# Patient Record
Sex: Male | Born: 1960 | Race: Asian | Hispanic: No | Marital: Married | Smoking: Former smoker
Health system: Southern US, Community
[De-identification: ages and names within clinical notes are randomized; demographics above are authoritative.]

## PROBLEM LIST (undated history)

## (undated) DIAGNOSIS — F1010 Alcohol abuse, uncomplicated: Secondary | ICD-10-CM

## (undated) DIAGNOSIS — I10 Essential (primary) hypertension: Secondary | ICD-10-CM

## (undated) DIAGNOSIS — I639 Cerebral infarction, unspecified: Secondary | ICD-10-CM

## (undated) DIAGNOSIS — E785 Hyperlipidemia, unspecified: Secondary | ICD-10-CM

## (undated) DIAGNOSIS — N179 Acute kidney failure, unspecified: Secondary | ICD-10-CM

## (undated) HISTORY — DX: Cerebral infarction, unspecified: I63.9

## (undated) HISTORY — DX: Essential (primary) hypertension: I10

---

## 2010-05-15 ENCOUNTER — Ambulatory Visit (HOSPITAL_BASED_OUTPATIENT_CLINIC_OR_DEPARTMENT_OTHER)
Admission: RE | Admit: 2010-05-15 | Discharge: 2010-05-15 | Payer: Self-pay | Source: Home / Self Care | Admitting: Internal Medicine

## 2010-05-15 ENCOUNTER — Encounter: Payer: Self-pay | Admitting: Internal Medicine

## 2010-05-15 ENCOUNTER — Ambulatory Visit: Payer: Self-pay | Admitting: Interventional Radiology

## 2010-05-15 ENCOUNTER — Ambulatory Visit: Payer: Self-pay | Admitting: Internal Medicine

## 2010-05-15 DIAGNOSIS — R143 Flatulence: Secondary | ICD-10-CM

## 2010-05-15 DIAGNOSIS — R0602 Shortness of breath: Secondary | ICD-10-CM

## 2010-05-15 DIAGNOSIS — R142 Eructation: Secondary | ICD-10-CM

## 2010-05-15 DIAGNOSIS — R141 Gas pain: Secondary | ICD-10-CM

## 2010-05-15 LAB — CONVERTED CEMR LAB
Albumin: 4.6 g/dL (ref 3.5–5.2)
BUN: 12 mg/dL (ref 6–23)
Basophils Relative: 1 % (ref 0–1)
CO2: 25 meq/L (ref 19–32)
Chloride: 104 meq/L (ref 96–112)
Creatinine, Ser: 1.05 mg/dL (ref 0.40–1.50)
Glucose, Bld: 75 mg/dL (ref 70–99)
Hemoglobin: 14.9 g/dL (ref 13.0–17.0)
Indirect Bilirubin: 0.2 mg/dL (ref 0.0–0.9)
LDL Cholesterol: 135 mg/dL — ABNORMAL HIGH (ref 0–99)
Lymphocytes Relative: 39 % (ref 12–46)
Lymphs Abs: 3.1 10*3/uL (ref 0.7–4.0)
Monocytes Absolute: 0.8 10*3/uL (ref 0.1–1.0)
Monocytes Relative: 10 % (ref 3–12)
Neutro Abs: 3.8 10*3/uL (ref 1.7–7.7)
Neutrophils Relative %: 48 % (ref 43–77)
Potassium: 4.6 meq/L (ref 3.5–5.3)
RBC: 5.64 M/uL (ref 4.22–5.81)
Total Bilirubin: 0.3 mg/dL (ref 0.3–1.2)
Total Protein: 7.3 g/dL (ref 6.0–8.3)
Triglycerides: 101 mg/dL (ref <150)
VLDL: 20 mg/dL (ref 0–40)
WBC: 7.8 10*3/uL (ref 4.0–10.5)

## 2010-05-16 ENCOUNTER — Encounter: Payer: Self-pay | Admitting: Internal Medicine

## 2010-05-29 ENCOUNTER — Ambulatory Visit: Payer: Self-pay | Admitting: Pulmonary Disease

## 2010-05-29 DIAGNOSIS — F17200 Nicotine dependence, unspecified, uncomplicated: Secondary | ICD-10-CM

## 2010-07-13 ENCOUNTER — Ambulatory Visit: Payer: Self-pay | Admitting: Internal Medicine

## 2010-09-04 NOTE — Letter (Signed)
   [facility] [address] [place], [place]  [address]  [place] Phone: [phone]      May 16, 2010   Willie Herrera [address] [place], [place] [address]  RE:  LAB RESULTS  Dear  Willie Herrera,  The following is an interpretation of your most recent lab tests.  Please take note of any instructions provided or changes to medications that have resulted from your lab work.  ELECTROLYTES:  Good - no changes needed  KIDNEY FUNCTION TESTS:  Good - no changes needed  LIVER FUNCTION TESTS:  Good - no changes needed  LIPID PANEL:  Fair - review at your next visit Triglyceride: 101   Cholesterol: 213   LDL: 135   HDL: 58   Chol/HDL%:  3.7 Ratio  THYROID STUDIES:  Thyroid studies normal TSH: 1.567     CBC:  Good - no changes needed       Sincerely Yours,    Dr. Thomos Lemons  Appended Document:  mailed

## 2010-09-04 NOTE — Assessment & Plan Note (Signed)
Summary: NEW TO EST BCBS/MHF   Vital Signs:  Patient profile:   50 year old male Height:      63.5 inches Weight:      173.75 pounds BMI:     30.41 O2 Sat:      97 % on Room air Temp:     98.3 degrees F oral Pulse rate:   75 / minute Pulse rhythm:   regular Resp:     18 per minute BP sitting:   104 / 80  (right arm) Cuff size:   large  Vitals Entered By: Glendell Docker CMA (May 15, 2010 9:48 AM)  O2 Flow:  Room air  Contraindications/Deferment of Procedures/Staging:    Test/Procedure: FLU VAX    Reason for deferment: patient declined  CC: New Patient  Is Patient Diabetic? No Pain Assessment Patient in pain? no      Comments Establish care, physical assessment   Primary Care Willie Herrera:  Willie Spry DO  CC:  New Patient .  History of Present Illness: 50 yo asian male to routine cpx.  No prev PCP  no prev med hx no chronic meds  pt with limited English he is originally from [place]  he notes chest feels tight occ when he wakes up in the AM  he is also concerned about distended abd  denies hx of liver disease  Preventive Screening-Counseling & Management  Alcohol-Tobacco     Alcohol drinks/day: <1     Smoking Status: current     Packs/Day: 0.5     Year Started: 1991  Caffeine-Diet-Exercise     Caffeine use/day: None     Does Patient Exercise: no  Allergies (verified): No Known Drug Allergies  Past History:  Past Medical History: Unremarkable  Family History: denies family hx of CAD, DM II, cancer  Social History: Married- 25 years 2 sons age 73, 42 Current Smoker - 1/2 ppd 20 yrs Alcohol use-yes (2-4 drinks per week) originally from - [place]  Been is [place] for 10 yrs Occupation:  works at [employer] Smoking Status:  current Packs/Day:  0.5 Caffeine use/day:  None Does Patient Exercise:  no  Review of Systems       The patient complains of chest pain.  The patient denies weight loss, weight gain, dyspnea on  exertion, prolonged cough, abdominal pain, melena, and hematochezia.         no notes skin is sensitive.  gets rash after scratching  ROS - limited by language barrier  Physical Exam  General:  alert, well-developed, and well-nourished.   Head:  normocephalic and atraumatic.   Ears:  R ear normal and L ear normal.  hearing is grossly normal  Mouth:  pharynx pink and moist and poor dentition.   Neck:  supple, no masses, and no thyromegaly.   Lungs:  normal respiratory effort, normal breath sounds, no crackles, and no wheezes.   Heart:  normal rate, regular rhythm, and no gallop.  heart sounds somewhat distant Abdomen:  distended,  soft, no masses, no hepatomegaly, and no splenomegaly.   Pulses:  dorsalis pedis and posterior tibial pulses are full and equal bilaterally Extremities:  No lower extremity edema Neurologic:  cranial nerves II-XII intact and gait normal.   Skin:  scattered hives left side of abd   Impression & Recommendations:  Problem # 1:  HEALTH MAINTENANCE EXAM (ICD-V70.0) Reviewed adult health maintenance protocols.  Orders: T-Lipid Profile [id number])  Flu Vax: Declined (05/15/2010)  Problem # 2:  SHORTNESS OF BREATH (ICD-786.05) office spirometry may be unreliable.  pt had difficulty understanding instructions as per nursing staff I encouraged smoking cessation  Orders: Spirometry w/Graph (21308) Pulmonary Referral (Pulmonary)  Problem # 3:  ABDOMINAL DISTENSION (ICD-787.3)  Orders: T-Basic Metabolic Panel [id number]) T-Hepatic Function [id number]) T-CBC w/Diff [id number]) T-TSH [id number])  Other Orders: T-2 View CXR, Same Day ([id number]) EKG w/ Interpretation (93000)  Patient Instructions: 1)  Please schedule a follow-up appointment in 2 months.  Current Allergies (reviewed today): No known allergies    Immunization History:  Influenza Immunization History:    Influenza:  declined (05/15/2010)

## 2010-09-04 NOTE — Assessment & Plan Note (Signed)
Summary: sob/ mbw   Visit Type:  Initial Consult Copy to:  pcp Primary Roye Gustafson/Referring Iokepa Geffre:  Dondra Spry DO  CC:  Pt here for pulmonary consult. Pt request flu vaccine today.  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 he reports sharp sub sternal chest pain, non radiating, non exertional , on & off x 1 yr, gradual worsening . He reports mild dyspnea when he hurries around - no wheezing, cough, pedal edema, orthopnea or nocturnal symptoms or prior pneumonia. he denies meal related pains or heartburn. CXR showed coarse perihilar & interstitial markings. EKG nml, labs wnl Spirometry was a poor effort. Repeat spirometry today showed no airway obstruction, only 1 good effort, FEv1 81%, FVC 76% - mild restriction  Preventive Screening-Counseling & Management  Alcohol-Tobacco     Alcohol drinks/day: <1     Alcohol type: spirits     Smoking Status: current     Packs/Day: 0.5     Year Started: 1991  Current Medications (verified): 1)  None  Allergies (verified): No Known Drug Allergies  Past History:  Past Medical History: Last updated: 05/15/2010 Unremarkable  Family History: Last updated: 05/15/2010 denies family hx of CAD, DM II, cancer  Social History: Last updated: 05/15/2010 Married- 25 years 2 sons age 4, 52 Current Smoker - 1/2 ppd 20 yrs Alcohol use-yes (2-4 drinks per week) originally from - [place]  Been is [place] for 10 yrs 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  The patient denies shortness of breath at rest, productive cough, non-productive cough, coughing up blood, irregular heartbeats, acid heartburn, indigestion, loss of appetite, weight change, abdominal  pain, difficulty swallowing, sore throat, tooth/dental problems, headaches, nasal congestion/difficulty breathing through nose, sneezing, ear ache, anxiety, depression, hand/feet swelling, joint stiffness or pain, change in color of mucus, and fever.    Vital Signs:  Patient profile:   50 year old male Height:      63.5 inches Weight:      171.5 pounds BMI:     30.01 O2 Sat:      98 % on Room air Temp:     98.5 degrees F oral Pulse rate:   67 / minute BP sitting:   108 / 74  (left arm) Cuff size:   regular  Vitals Entered By: Zackery Barefoot CMA (May 29, 2010 9:27 AM)  O2 Flow:  Room air CC: Pt here for pulmonary consult. Pt request flu vaccine today Comments Medications reviewed with patient Verified contact number and pharmacy with patient Zackery Barefoot CMA  May 29, 2010 9:27 AM    Physical Exam  Additional Exam:  Gen. Pleasant, well-nourished, in no distress, normal affect ENT - no lesions, no post nasal drip Neck: No JVD, no thyromegaly, no carotid bruits Lungs: no use of accessory muscles, no dullness to percussion, clear without rales or rhonchi  Cardiovascular: Rhythm regular, heart sounds  normal, no murmurs or gallops, no peripheral edema Abdomen: soft and non-tender, no hepatosplenomegaly, BS normal. Musculoskeletal: No deformities, no cyanosis or clubbing Neuro:  alert, non focal     Impression & Recommendations:  Problem # 1:  SHORTNESS OF BREATH (ICD-786.05) No clear reason for coarse interstitial markings on CXR - no obvious ILD Rpt  CXR in 6 mnths mild restriction on spirometry  - poor effort Orders: Est. Patient Level III (16109) Prescription Created Electronically ([id number])Future Orders: T-2 View CXR ([id number]) ... 11/13/2010  Problem # 2:  TOBACCO ABUSE (ICD-305.1)  he will try to set a quit date. His updated medication list for this problem includes:    Chantix Starting Month Pak 0.5 Mg X 11 & 1 Mg X 42 Tabs (Varenicline tartrate) .Marland Kitchen...  Take as directed    Chantix Continuing Month Pak 1 Mg Tabs (Varenicline tartrate) .Marland Kitchen... Take as directed  Orders: Est. Patient Level III (60454) Prescription Created Electronically [id number])  Medications Added to Medication List This Visit: 1)  Chantix Starting Month Pak 0.5 Mg X 11 & 1 Mg X 42 Tabs (Varenicline tartrate) .... Take as directed 2)  Chantix Continuing Month Pak 1 Mg Tabs (Varenicline tartrate) .... Take as directed  Patient Instructions: 1)  Copy sent to:dr Artist Pais 2)  Please schedule a follow-up appointment in 6 months with chest x ray 3)  Take pepcid over the counter for heartburn x 4 wks 4)  Chantix Rx given to try & quit smoking- set quit date first Prescriptions: CHANTIX CONTINUING MONTH PAK 1 MG TABS (VARENICLINE TARTRATE) take as directed  #1 x 2   Entered and Authorized by:   Comer Locket Vassie Loll MD   Signed by:   Comer Locket Vassie Loll MD on 05/29/2010   Method used:   Print then Give to Patient   RxID:   [id number] CHANTIX STARTING MONTH PAK 0.5 MG X 11 & 1 MG X 42 TABS (VARENICLINE TARTRATE) take as directed  #1 x 0   Entered and Authorized by:   Comer Locket Vassie Loll MD   Signed by:   Comer Locket Vassie Loll MD on 05/29/2010   Method used:   Print then Give to Patient   RxID:   [id number]

## 2010-09-06 NOTE — Assessment & Plan Note (Signed)
Summary: 2 MONTH FU/DT   Vital Signs:  Patient profile:   50 year old male Height:      63.5 inches Weight:      171.50 pounds BMI:     30.01 O2 Sat:      100 % on Room air Temp:     97.9 degrees F oral Pulse rate:   65 / minute Resp:     18 per minute BP sitting:   120 / 90  (left arm) Cuff size:   large  Vitals Entered By: Glendell Docker CMA (July 13, 2010 3:30 PM)  O2 Flow:  Room air CC: 2 Month Follow up  Is Patient Diabetic? No Pain Assessment Patient in pain? no      Comments no concerns   Primary Care Mahdiya Mossberg:  Dondra Spry DO  CC:  2 Month Follow up .  History of Present Illness: 50 y/o Asian male for follow up pt accompanied by son who is translating  first noticed SOB 7 months ago he assoc with weather change mild sub sternal  pain - non exertional he is trying to reduce tob use  lab results reviewed   Preventive Screening-Counseling & Management  Alcohol-Tobacco     Smoking Status: current  Allergies (verified): No Known Drug Allergies  Family History: denies family hx of CAD, DM II, cancer   Social History: Married- 25 years 2 sons age 39, 3  Current Smoker - 1/2 ppd 20 yrs Alcohol use-yes (2-4 drinks per week) originally from - [place]  Been is [place] for 10 yrs Occupation:  works at [employer]  Physical Exam  General:  alert, well-developed, and well-nourished.   Lungs:  normal respiratory effort, normal breath sounds, no crackles, and no wheezes.   Heart:  normal rate, regular rhythm, and no gallop.  heart sounds somewhat distant Extremities:  No lower extremity edema   Impression & Recommendations:  Problem # 1:  SHORTNESS OF BREATH (ICD-786.05) Assessment Improved seen by pulm likely related to tob use and poor conditioning CXR to be repeated in 6 months I urged tob cessation we discussed proper use of nicotine patches and lozenges  Patient Instructions: 1)  Please schedule a follow-up appointment  in 1 year.   Orders Added: 1)  Est. Patient Level III [16109]    Current Allergies (reviewed today): No known allergies

## 2014-04-18 ENCOUNTER — Ambulatory Visit (INDEPENDENT_AMBULATORY_CARE_PROVIDER_SITE_OTHER): Payer: BC Managed Care – PPO | Admitting: Medical

## 2014-04-18 ENCOUNTER — Encounter: Payer: Self-pay | Admitting: Medical

## 2014-04-18 VITALS — BP 128/84 | HR 71 | Temp 98.6°F | Ht 63.4 in | Wt 182.4 lb

## 2014-04-18 DIAGNOSIS — E669 Obesity, unspecified: Secondary | ICD-10-CM

## 2014-04-18 DIAGNOSIS — G4700 Insomnia, unspecified: Secondary | ICD-10-CM | POA: Insufficient documentation

## 2014-04-18 MED ORDER — ZOLPIDEM TARTRATE 10 MG PO TABS: 10.0000 mg | ORAL_TABLET | Freq: Every evening | ORAL | Status: DC | PRN

## 2014-04-18 NOTE — Assessment & Plan Note (Signed)
I will give him Ambien for short-term treatment of insomnia. He has not felt well at all for one week and this will hopefully provide him some relief. However, I did advise them that if his sleeping did not improve then would need to consider possible sleep study as he does have a body habitus that would predispose him to sleep apnea.

## 2014-04-18 NOTE — Progress Notes (Signed)
   Subjective:    Patient ID: Willie Herrera, male    DOB: 1960-08-28, 53 y.o.   MRN: [id number]  HPI  Pt not seen here for 4 years.  See PMH, PSH, and FMH. Pt son translates for him. Pt speaks Burmese.  On review of his history sleep apnea was checked but I unchecked this because he denies. Pt denies that he has any problems sleeping. Minimal snore. He states can't fall sleep and then denies any depression or anxiety. He states sleeping issues just started between 7-10 days. Pt denies that he is taking any new medications. Pt states works 7 days a week 10-11 hour days. Coffee 1 cup a day. He works 6-7 am to 5 pm. In the past happened less frequent.   On further discussion he admits some history of sleeping difficulty. He would wake up middle of the night and not be able to go back to sleep. But last week can't even fall asleep.  Pt no exercising. 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      Review of Systems  Constitutional: Negative for fever, chills and fatigue.  HENT: Negative.   Respiratory: Negative for cough, chest tightness and shortness of breath.   Cardiovascular: Negative for chest pain.  Gastrointestinal: Negative for nausea, abdominal pain, diarrhea, constipation, blood in stool, abdominal distention and anal bleeding.  Genitourinary: Negative.   Musculoskeletal: Negative.   Neurological: Negative.   Hematological: Negative for adenopathy. Does not bruise/bleed easily.  Psychiatric/Behavioral: Negative for suicidal ideas, hallucinations, behavioral problems, confusion, sleep disturbance, dysphoric mood, decreased concentration and agitation. The patient is not nervous/anxious and is not hyperactive.        Insomnia.       Objective:   Physical Exam  General- no acute distress, pleasant patient. He understands English minimally. His son translates the interview. Neck-thick, stocky. No JVD Lungs-clear even and unlabored. No on lying supine his O2 sat went from 96% to 93%. But it  did not drop any further. Heart-regular rate and rhythm. Abdomen-obese protuberant abdomen. Soft, nondistended, no rebound or guarding no organomegaly. Back-no CVA tenderness         Assessment & Plan:

## 2014-04-18 NOTE — Patient Instructions (Signed)
You appear to have insomnia. I am giving some medication for the short term insomina to help you sleep. However, keep in mind that you may have some sleep apnea. So we may refer you for sleep study in the near future. Also I want you to schedule a complete physical exam in 2 wks. This would be a very good ideas to get baseline fasting labs and schedule colonosocpy as well. Schedule early morning and come in fasting.

## 2014-04-18 NOTE — Assessment & Plan Note (Signed)
Patient is an obese and 53 years old. I do want him to come in had a complete physical exam in 2 weeks. I asked him to set that up and come in fasting. We'll see how he is sleeping at that point and may refer him for a sleep study.

## 2014-05-03 ENCOUNTER — Encounter: Payer: Self-pay | Admitting: Medical

## 2014-05-03 ENCOUNTER — Ambulatory Visit (INDEPENDENT_AMBULATORY_CARE_PROVIDER_SITE_OTHER): Payer: BC Managed Care – PPO | Admitting: Medical

## 2014-05-03 VITALS — BP 145/90 | HR 94 | Temp 98.6°F | Ht 63.4 in | Wt 185.0 lb

## 2014-05-03 DIAGNOSIS — Z1211 Encounter for screening for malignant neoplasm of colon: Secondary | ICD-10-CM

## 2014-05-03 DIAGNOSIS — Z0000 Encounter for general adult medical examination without abnormal findings: Secondary | ICD-10-CM

## 2014-05-03 DIAGNOSIS — Z23 Encounter for immunization: Secondary | ICD-10-CM

## 2014-05-03 LAB — COMPREHENSIVE METABOLIC PANEL
ALK PHOS: 60 U/L (ref 39–117)
ALT: 71 U/L — AB (ref 0–53)
AST: 44 U/L — AB (ref 0–37)
Albumin: 4.1 g/dL (ref 3.5–5.2)
BILIRUBIN TOTAL: 0.6 mg/dL (ref 0.2–1.2)
BUN: 13 mg/dL (ref 6–23)
CO2: 26 meq/L (ref 19–32)
CREATININE: 1.1 mg/dL (ref 0.4–1.5)
Calcium: 9.1 mg/dL (ref 8.4–10.5)
Chloride: 102 mEq/L (ref 96–112)
GFR: 75.86 mL/min (ref >60.00)
Glucose, Bld: 106 mg/dL — ABNORMAL HIGH (ref 70–99)
Potassium: 4.6 mEq/L (ref 3.5–5.1)
SODIUM: 136 meq/L (ref 135–145)
TOTAL PROTEIN: 8.1 g/dL (ref 6.0–8.3)

## 2014-05-03 LAB — CBC WITH DIFFERENTIAL/PLATELET
BASOS ABS: 0 10*3/uL (ref 0.0–0.1)
Basophils Relative: 0.4 % (ref 0.0–3.0)
EOS ABS: 0.2 10*3/uL (ref 0.0–0.7)
Eosinophils Relative: 2.7 % (ref 0.0–5.0)
HEMATOCRIT: 44.4 % (ref 39.0–52.0)
Hemoglobin: 14.6 g/dL (ref 13.0–17.0)
LYMPHS ABS: 2.2 10*3/uL (ref 0.7–4.0)
Lymphocytes Relative: 31.2 % (ref 12.0–46.0)
MCHC: 32.8 g/dL (ref 30.0–36.0)
MCV: 78.4 fl (ref 78.0–100.0)
MONO ABS: 0.6 10*3/uL (ref 0.1–1.0)
MONOS PCT: 7.9 % (ref 3.0–12.0)
Neutro Abs: 4.1 10*3/uL (ref 1.4–7.7)
Neutrophils Relative %: 57.8 % (ref 43.0–77.0)
PLATELETS: 208 10*3/uL (ref 150.0–400.0)
RBC: 5.67 Mil/uL (ref 4.22–5.81)
RDW: 15.4 % (ref 11.5–15.5)
WBC: 7.1 10*3/uL (ref 4.0–10.5)

## 2014-05-03 LAB — LIPID PANEL
CHOL/HDL RATIO: 3
Cholesterol: 218 mg/dL — ABNORMAL HIGH (ref 0–200)
HDL: 64.4 mg/dL (ref >39.00)
LDL CALC: 137 mg/dL — AB (ref 0–99)
NONHDL: 153.6
TRIGLYCERIDES: 83 mg/dL (ref 0.0–149.0)
VLDL: 16.6 mg/dL (ref 0.0–40.0)

## 2014-05-03 LAB — TSH: TSH: 0.42 u[IU]/mL (ref 0.35–4.50)

## 2014-05-03 NOTE — Progress Notes (Signed)
   Subjective:    Patient ID: Willie Herrera, male    DOB: 03/09/1961, 53 y.o.   MRN: [id number]  HPI  Pt in for complete physical.  Pt employed as a [employer]. Pt is taking 2 month off from work. Not exercising. He states eats fruits and vegetables. Some meats. Pt states that he finally is sleeping better with ambien but only 5 hours. Previously only slept 2 hours. Pt states never had colonoscopy. Pt quit smoking 7-8 months ago. No longer smoking. No drugs.   Pt speaks burmese(Primary Language). He states understands me well but does not speak english well.  Flu vacinne given today. Our office to not have access to [facility] database immunizations today.  No acute new problems reported today.     Review of Systems  Constitutional: Negative for fever, chills and fatigue.  HENT: Negative.   Eyes: Negative.   Respiratory: Negative for cough, chest tightness, shortness of breath and wheezing.   Cardiovascular: Negative for chest pain and palpitations.  Gastrointestinal: Negative.   Genitourinary: Negative.   Musculoskeletal: Negative.   Skin: Negative.   Neurological: Negative for dizziness, tremors, syncope, facial asymmetry, weakness, light-headedness and numbness.  Hematological: Negative for adenopathy. Does not bruise/bleed easily.  Psychiatric/Behavioral: Positive for sleep disturbance. Negative for suicidal ideas, hallucinations, behavioral problems, confusion, self-injury, dysphoric mood and agitation. The patient is not hyperactive.        Sleeping better but only 5 hours each night now. He states in past was only 2 hours each night.            Objective:   Physical Exam  General Mental Status- Alert. Orientation- Oriented x3.  Build and Nutrition- Well nourished and Well Developed.  Skin General:-Normal. Color- Normal color. Moisture- Normal. Temperature-Warm.  HEENT  Ears- Normal. Auditory Canal- Bilateral-Normal. Tympanic Membrane-  Bilateral-Normal. Eye Fundi-Bilateral-Normal. Pupil- bilateral- Direct reaction to light normal. Nose & Sinuses- Normal. Nostrils-Bilateral- Normal. Mouth & Throat-Normal. But very poor teeth. Very worn down bottom teeth.  Neck Neck- No Bruits or Masses. Trachea midline.  Thyroid- Normal.  Chest and Lung Exam Percussion: Quality and Intensity-Percussion normal. Percussion of the chest reveals- No Dullness.  Palpation: Palpation of the chest reveals- Non-tender- No dullness. Auscultation: Breath Sounds- Normal.  Adventitous Sounds:-No adventitious sounds.  Cardiovascular Inspection:- No Heaves. Auscultation:-Normal sinus rhythm without murmur gallop, S1 WNL and S2 WNL.  Abdomen Inspection:-Inspection shows protuberant abdomen mild-moderatel. Inspection of the abdomen reveals- No hernias Palpation/Percussion:- Palpation and Percussion of the Abdomen reveal- Non Tender and No Palpable abdominal masses. Liver: Other Characteristics- No hepatomegaly. Spleen:Other Characteristics- No Splenomegaly. Auscultation:- Auscultation of the abdomen reveals- Bowel sounds normal and No Abdominal bruits.  Male Genitourinary Urethra:- No discharge. Penis- Circumcised. Scrotum- No masses. Testes- Bilateral-Normal.  Rectal Anorectal Exam: Performed- Normal sphincter tone. No masses noted. Prostate smooth normal size. Stool HEME Negative.  Peripheral  Vascular Lower Extremity:Inspection- Bilateral-Inspection Normal  Palpation: Femoral pulse- Bilateral- 2+. Popliteal pulse- Bilateral-2+. Dorsalis pedis pulse- Bilateral- 1/2+. Edema- Bilateral- No edema.  Neurologic Mental Status:- Normal. Cranial Nerves:-Normal Bilaterally. Motor:-Normal. Strength:5/5 normal muscle strength-All Muscles. General Assessment of Reflexes: Right Knee-2+. Left Knee- 2+. Coordination-Normal. Gait- Normal.  Meningeal Signs- None.  Musculoskeletal Global Assessment General-Joints show full range of motion without  obvious deformity and Normal muscle mass. Strength in upper and lower extremities.  Lymphatics General lymphatics Description- No generalized lymphadenopathy. Poor dentition.        Assessment & Plan:

## 2014-05-03 NOTE — Assessment & Plan Note (Signed)
Sceening labs fasting today with Physical exam. Get fasting cmp, cbc, tsh, and lipid panel. Did also go ahead and make referral to GI fo screening colonosnocpy.

## 2014-05-03 NOTE — Patient Instructions (Addendum)
Please get your screening labs today. I am scheduling your screening colonoscopy exam in near future. I am giving dash diet for your blood pressure. Check your bp daily and document readings. Follow up in 2-3 wks or as needed  On the follow up visit I will check your blood pressure and if still elevated likely start on BP medications.  Bring document BP readings on your follow up. Also will review your insomnia and possible snoring. Plan to discuss with you referral for sleep study to check if you have sleep apnea.   DASH Eating Plan DASH stands for "Dietary Approaches to Stop Hypertension." The DASH eating plan is a healthy eating plan that has been shown to reduce high blood pressure (hypertension). Additional health benefits may include reducing the risk of type 2 diabetes mellitus, heart disease, and stroke. The DASH eating plan may also help with weight loss. WHAT DO I NEED TO KNOW ABOUT THE DASH EATING PLAN? For the DASH eating plan, you will follow these general guidelines:  Choose foods with a percent daily value for sodium of less than 5% (as listed on the food label).  Use salt-free seasonings or herbs instead of table salt or sea salt.  Check with your health care provider or pharmacist before using salt substitutes.  Eat lower-sodium products, often labeled as "lower sodium" or "no salt added."  Eat fresh foods.  Eat more vegetables, fruits, and low-fat dairy products.  Choose whole grains. Look for the word "whole" as the first word in the ingredient list.  Choose fish and skinless chicken or [place] more often than red meat. Limit fish, poultry, and meat to 6 oz (170 g) each day.  Limit sweets, desserts, sugars, and sugary drinks.  Choose heart-healthy fats.  Limit cheese to 1 oz (28 g) per day.  Eat more home-cooked food and less restaurant, buffet, and fast food.  Limit fried foods.  Cook foods using methods other than frying.  Limit canned vegetables. If you do  use them, rinse them well to decrease the sodium.  When eating at a restaurant, ask that your food be prepared with less salt, or no salt if possible. WHAT FOODS CAN I EAT? Seek help from a dietitian for individual calorie needs. Grains Whole grain or whole wheat bread. Brown rice. Whole grain or whole wheat pasta. Quinoa, bulgur, and whole grain cereals. Low-sodium cereals. Corn or whole wheat flour tortillas. Whole grain cornbread. Whole grain crackers. Low-sodium crackers. Vegetables Fresh or frozen vegetables (raw, steamed, roasted, or grilled). Low-sodium or reduced-sodium tomato and vegetable juices. Low-sodium or reduced-sodium tomato sauce and paste. Low-sodium or reduced-sodium canned vegetables.  Fruits All fresh, canned (in natural juice), or frozen fruits. Meat and Other Protein Products Ground beef (85% or leaner), grass-fed beef, or beef trimmed of fat. Skinless chicken or [place]. Ground chicken or [place]key. Pork trimmed of fat. All fish and seafood. Eggs. Dried beans, peas, or lentils. Unsalted nuts and seeds. Unsalted canned beans. Dairy Low-fat dairy products, such as skim or 1% milk, 2% or reduced-fat cheeses, low-fat ricotta or cottage cheese, or plain low-fat yogurt. Low-sodium or reduced-sodium cheeses. Fats and Oils Tub margarines without trans fats. Light or reduced-fat mayonnaise and salad dressings (reduced sodium). Avocado. Safflower, olive, or canola oils. Natural peanut or almond butter. Other Unsalted popcorn and pretzels. The items listed above may not be a complete list of recommended foods or beverages. Contact your dietitian for more options. WHAT FOODS ARE NOT RECOMMENDED? Grains White bread. White pasta.  White rice. Refined cornbread. Bagels and croissants. Crackers that contain trans fat. Vegetables Creamed or fried vegetables. Vegetables in a cheese sauce. Regular canned vegetables. Regular canned tomato sauce and paste. Regular tomato and vegetable  juices. Fruits Dried fruits. Canned fruit in light or heavy syrup. Fruit juice. Meat and Other Protein Products Fatty cuts of meat. Ribs, chicken wings, bacon, sausage, bologna, salami, chitterlings, fatback, hot dogs, bratwurst, and packaged luncheon meats. Salted nuts and seeds. Canned beans with salt. Dairy Whole or 2% milk, cream, half-and-half, and cream cheese. Whole-fat or sweetened yogurt. Full-fat cheeses or blue cheese. Nondairy creamers and whipped toppings. Processed cheese, cheese spreads, or cheese curds. Condiments Onion and garlic salt, seasoned salt, table salt, and sea salt. Canned and packaged gravies. Worcestershire sauce. Tartar sauce. Barbecue sauce. Teriyaki sauce. Soy sauce, including reduced sodium. Steak sauce. Fish sauce. Oyster sauce. Cocktail sauce. Horseradish. Ketchup and mustard. Meat flavorings and tenderizers. Bouillon cubes. Hot sauce. Tabasco sauce. Marinades. Taco seasonings. Relishes. Fats and Oils Butter, stick margarine, lard, shortening, ghee, and bacon fat. Coconut, palm kernel, or palm oils. Regular salad dressings. Other Pickles and olives. Salted popcorn and pretzels. The items listed above may not be a complete list of foods and beverages to avoid. Contact your dietitian for more information. WHERE CAN I FIND MORE INFORMATION? National Heart, Lung, and Blood Institute: travelstabloid.com Document Released: 07/11/2011 Document Revised: 12/06/2013 Document Reviewed: 05/26/2013 [facility] Patient Information 2015 [place], [place]. This information is not intended to replace advice given to you by your health care provider. Make sure you discuss any questions you have with your health care provider.

## 2014-05-04 ENCOUNTER — Other Ambulatory Visit: Payer: Self-pay

## 2014-08-18 LAB — HM DIABETES EYE EXAM

## 2014-08-25 ENCOUNTER — Encounter: Payer: Self-pay | Admitting: Medical

## 2014-08-25 ENCOUNTER — Ambulatory Visit (INDEPENDENT_AMBULATORY_CARE_PROVIDER_SITE_OTHER): Payer: BLUE CROSS/BLUE SHIELD | Admitting: Medical

## 2014-08-25 VITALS — BP 152/100 | HR 75 | Temp 97.8°F | Ht 63.4 in | Wt 189.2 lb

## 2014-08-25 DIAGNOSIS — R739 Hyperglycemia, unspecified: Secondary | ICD-10-CM

## 2014-08-25 DIAGNOSIS — I10 Essential (primary) hypertension: Secondary | ICD-10-CM

## 2014-08-25 DIAGNOSIS — J3089 Other allergic rhinitis: Secondary | ICD-10-CM

## 2014-08-25 DIAGNOSIS — J309 Allergic rhinitis, unspecified: Secondary | ICD-10-CM | POA: Insufficient documentation

## 2014-08-25 HISTORY — DX: Essential (primary) hypertension: I10

## 2014-08-25 LAB — COMPREHENSIVE METABOLIC PANEL
ALT: 109 U/L — ABNORMAL HIGH (ref 0–53)
AST: 62 U/L — ABNORMAL HIGH (ref 0–37)
Albumin: 4.3 g/dL (ref 3.5–5.2)
Alkaline Phosphatase: 73 U/L (ref 39–117)
BILIRUBIN TOTAL: 0.4 mg/dL (ref 0.2–1.2)
BUN: 12 mg/dL (ref 6–23)
CHLORIDE: 100 meq/L (ref 96–112)
CO2: 27 meq/L (ref 19–32)
CREATININE: 1.09 mg/dL (ref 0.40–1.50)
Calcium: 9.4 mg/dL (ref 8.4–10.5)
GFR: 75.77 mL/min (ref >60.00)
Glucose, Bld: 95 mg/dL (ref 70–99)
POTASSIUM: 4 meq/L (ref 3.5–5.1)
Sodium: 135 mEq/L (ref 135–145)
TOTAL PROTEIN: 7.9 g/dL (ref 6.0–8.3)

## 2014-08-25 LAB — HEMOGLOBIN A1C: Hgb A1c MFr Bld: 5.4 % (ref 4.6–6.5)

## 2014-08-25 MED ORDER — FLUTICASONE PROPIONATE 50 MCG/ACT NA SUSP: 2.0000 | Freq: Every day | NASAL | Status: DC

## 2014-08-25 MED ORDER — LISINOPRIL 10 MG PO TABS: 10.0000 mg | ORAL_TABLET | Freq: Every day | ORAL | Status: DC

## 2014-08-25 MED ORDER — BENZONATATE 100 MG PO CAPS: 100.0000 mg | ORAL_CAPSULE | Freq: Three times a day (TID) | ORAL | Status: DC | PRN

## 2014-08-25 NOTE — Assessment & Plan Note (Signed)
For your mild high bs and recent optometrist concerns for retinopathy will get cmp today and a1-c.

## 2014-08-25 NOTE — Progress Notes (Signed)
Pre visit review using our clinic review tool, if applicable. No additional management support is needed unless otherwise documented below in the visit note. 

## 2014-08-25 NOTE — Patient Instructions (Signed)
For your blood pressure, I am prescribing lisinopril today.  For your mild high bs and recent optometrist concerns for retinopathy will get cmp today and a1-c.  For you mild cough at night and sneezing on exam today you may have some allergies. I will rx flonase nasal spray and rx benzonatate to help with the cough. If these symptoms worsen or change notify us.  Follow up 2 wks for bp check or as needed.

## 2014-08-25 NOTE — Progress Notes (Signed)
Subjective:    Patient ID: Willie Herrera, male    DOB: 06/30/1961, 54 y.o.   MRN: [id number]  HPI   Pt states he went to his eye doctor and they found hypertensive  retinopathy changes. Pt does not report polyuria, polyphagia, or  increased thirst.   They thought maybe diabetes and he does have mild high blood sugar in past.  Pt bp has been elevated last 2 times. No cardiac or neurologic signs or symptoms.  Also some recent sneezing and some coughing at night. Last 2 weeks. Cough more at night.  No past medical history on file.  History   Social History  . Marital Status: Married    Spouse Name: N/A    Number of Children: N/A  . Years of Education: N/A   Occupational History  . Not on file.   Social History Main Topics  . Smoking status: Former Games developermoker  . Smokeless tobacco: Never Used  . Alcohol Use: Yes  . Drug Use: Not on file  . Sexual Activity: Not on file   Other Topics Concern  . Not on file   Social History Narrative    No past surgical history on file.  Family History  Problem Relation Age of Onset  . Hypertension Mother     No Known Allergies  Current Outpatient Prescriptions on File Prior to Visit  Medication Sig Dispense Refill  . zolpidem (AMBIEN) 10 MG tablet Take 1 tablet (10 mg total) by mouth at bedtime as needed for sleep. 10 tablet 1   No current facility-administered medications on file prior to visit.    BP 152/100 mmHg  Pulse 75  Temp(Src) 97.8 F (36.6 C) (Oral)  Ht 5' 3.4" (1.61 m)  Wt 189 lb 3.2 oz (85.821 kg)  BMI 33.11 kg/m2  SpO2 94%      Review of Systems  Constitutional: Negative for fever, chills, diaphoresis, activity change and fatigue.  HENT: Positive for postnasal drip and sneezing. Negative for congestion, ear pain, mouth sores, tinnitus and voice change.   Respiratory: Positive for cough. Negative for chest tightness and shortness of breath.   Cardiovascular: Negative for chest pain, palpitations and leg  swelling.  Gastrointestinal: Negative for nausea, vomiting and abdominal pain.  Musculoskeletal: Negative for back pain, neck pain and neck stiffness.  Neurological: Negative for dizziness, tremors, seizures, syncope, facial asymmetry, speech difficulty, weakness, light-headedness, numbness and headaches.  Psychiatric/Behavioral: Negative for behavioral problems, confusion and agitation. The patient is not nervous/anxious.        Objective:   Physical Exam   General Mental Status- Alert. General Appearance- Not in acute distress.   Skin General: Color- Normal Color. Moisture- Normal Moisture.  Neck Carotid Arteries- Normal color. Moisture- Normal Moisture. No carotid bruits. No JVD.   HEENT Head- Normal. Ear Auditory Canal - Left- Normal. Right - Normal.Tympanic Membrane- Left- Normal. Right- Normal. Eye Sclera/Conjunctiva- Left- Normal. Right- Normal. Nose & Sinuses Nasal Mucosa- Left-  Boggy and Congested. Right-  Boggy and  Congested. No Bilateral maxillary or frontal sinus pressure. Mouth & Throat Lips: Upper Lip- Normal: no dryness, cracking, pallor, cyanosis, or vesicular eruption. Lower Lip-Normal: no dryness, cracking, pallor, cyanosis or vesicular eruption. Buccal Mucosa- Bilateral- No Aphthous ulcers. Oropharynx- No Discharge or Erythema. +PND Tonsils: Characteristics- Bilateral- No Erythema or Congestion. Size/Enlargement- Bilateral- No enlargement. Discharge- bilateral-None.  Neck Neck- Supple. No Masses.   Chest and Lung Exam Auscultation: Breath Sounds:-Normal.  Cardiovascular Auscultation:Rythm- Regular. Murmurs & Other Heart Sounds:Auscultation of the heart  reveals- No Murmurs.  Abdomen Inspection:-Inspeection Normal. Palpation/Percussion:Note:No mass. Palpation and Percussion of the abdomen reveal- Non Tender, Non Distended + BS, no rebound or guarding.    Neurologic Cranial Nerve exam:- CN III-XII intact(No nystagmus), symmetric smile. Drift  Test:- No drift. Romberg Exam:- Negative.  Heal to Toe Gait exam:-Normal. Finger to Nose:- Normal/Intact Strength:- 5/5 equal and symmetric strength both upper and lower extremities.        Assessment & Plan:

## 2014-08-25 NOTE — Assessment & Plan Note (Signed)
For you mild cough at night and sneezing on exam today you may have some allergies. I will rx flonase nasal spray and rx benzonatate to help with the cough. If these symptoms worsen or change notify us.

## 2014-08-25 NOTE — Assessment & Plan Note (Signed)
For your blood pressure, I am prescribing lisinopril today. With the blood pressure last time, today reading and finding on eye exam best to start med lisinopril.

## 2014-09-13 ENCOUNTER — Telehealth: Payer: Self-pay | Admitting: Medical

## 2014-09-13 ENCOUNTER — Ambulatory Visit (INDEPENDENT_AMBULATORY_CARE_PROVIDER_SITE_OTHER): Payer: BLUE CROSS/BLUE SHIELD | Admitting: Medical

## 2014-09-13 ENCOUNTER — Encounter: Payer: Self-pay | Admitting: Medical

## 2014-09-13 ENCOUNTER — Ambulatory Visit (HOSPITAL_BASED_OUTPATIENT_CLINIC_OR_DEPARTMENT_OTHER)
Admission: RE | Admit: 2014-09-13 | Discharge: 2014-09-13 | Disposition: A | Discharge status: Home / Self Care | Payer: BLUE CROSS/BLUE SHIELD | Source: Ambulatory Visit | Attending: Medical | Admitting: Medical

## 2014-09-13 VITALS — BP 124/79 | HR 103 | Temp 99.0°F | Ht 63.4 in | Wt 183.8 lb

## 2014-09-13 DIAGNOSIS — I10 Essential (primary) hypertension: Secondary | ICD-10-CM

## 2014-09-13 DIAGNOSIS — R0989 Other specified symptoms and signs involving the circulatory and respiratory systems: Secondary | ICD-10-CM | POA: Insufficient documentation

## 2014-09-13 DIAGNOSIS — R059 Cough, unspecified: Secondary | ICD-10-CM

## 2014-09-13 DIAGNOSIS — R05 Cough: Secondary | ICD-10-CM | POA: Diagnosis not present

## 2014-09-13 DIAGNOSIS — T18108A Unspecified foreign body in esophagus causing other injury, initial encounter: Secondary | ICD-10-CM

## 2014-09-13 DIAGNOSIS — J302 Other seasonal allergic rhinitis: Secondary | ICD-10-CM

## 2014-09-13 MED ORDER — LOSARTAN POTASSIUM 100 MG PO TABS: 100.0000 mg | ORAL_TABLET | Freq: Every day | ORAL | Status: DC

## 2014-09-13 MED ORDER — HYDROCODONE-HOMATROPINE 5-1.5 MG/5ML PO SYRP: 5.0000 mL | ORAL_SOLUTION | Freq: Three times a day (TID) | ORAL | Status: DC | PRN

## 2014-09-13 NOTE — Progress Notes (Signed)
Subjective:    Patient ID: Willie Herrera, male    DOB: 1961-06-23, 54 y.o.   MRN: [id number]  HPI   Pt in with coughing for about one month. It started about one month ago. Pt states worse cough at night. Pt had some sneezing last time on interview. Pt is using and benzonatate but he is still coughing. He is using flonase for allergies.  Pt feels like something may be stuck in throat. Pt states 2 months ago he speculates maybe fish bone got stuck in his throat but he is not sure. Pt is able to eat and swallow since then.  Pt not reporting heatburn/gerd or any wheezing.    Review of Systems  Constitutional: Negative for fever and fatigue.  HENT: Positive for postnasal drip. Negative for congestion, ear discharge, ear pain, mouth sores, nosebleeds, rhinorrhea, sinus pressure, sore throat, tinnitus and trouble swallowing.   Respiratory: Positive for cough. Negative for choking, chest tightness, shortness of breath and wheezing.        But not productive. Cough is dry.  Cardiovascular: Negative for chest pain and palpitations.  Gastrointestinal: Negative for abdominal pain, constipation and abdominal distention.  Musculoskeletal: Negative for back pain.  Hematological: Negative for adenopathy. Does not bruise/bleed easily.       Objective:   Physical Exam  General  Mental Status - Alert. General Appearance - Well groomed. Not in acute distress.  Skin Rashes- No Rashes.  HEENT Head- Normal. Ear Auditory Canal - Left- Normal. Right - Normal.Tympanic Membrane- Left- Normal. Right- Normal. Eye Sclera/Conjunctiva- Left- Normal. Right- Normal. Nose & Sinuses Nasal Mucosa- Left-  Boggy + Congested. Right-  boggy + Congested. Mouth & Throat Lips: Upper Lip- Normal: no dryness, cracking, pallor, cyanosis, or vesicular eruption. Lower Lip-Normal: no dryness, cracking, pallor, cyanosis or vesicular eruption. Buccal Mucosa- Bilateral- No Aphthous ulcers. Oropharynx- No Discharge or  Erythema. +pnd Tonsils: Characteristics- Bilateral- No Erythema or Congestion. Size/Enlargement- Bilateral- No enlargement. Discharge- bilateral-None.  Neck Neck- Supple. No Masses.   Chest and Lung Exam Auscultation: Breath Sounds:- even and unlabored,   Cardiovascular Auscultation:Rythm- Regular, rate and rhythm. Murmurs & Other Heart Sounds:Ausculatation of the heart reveal- No Murmurs.  Lymphatic Head & Neck General Head & Neck Lymphatics: Bilateral: Description- No Localized lymphadenopathy.   Abdomen Inspection:-Inspection Normal.  Palpation/Perucssion: Palpation and Percussion of the abdomen reveal- Non Tender, No Rebound tenderness, No rigidity(Guarding) and No Palpable abdominal masses.  Liver:-Normal.  Spleen:- Normal.   .  Past Medical History  Diagnosis Date  . HTN (hypertension) 08/25/2014    History   Social History  . Marital Status: Married    Spouse Name: N/A    Number of Children: N/A  . Years of Education: N/A   Occupational History  . Not on file.   Social History Main Topics  . Smoking status: Former Games developer  . Smokeless tobacco: Never Used  . Alcohol Use: Yes  . Drug Use: Not on file  . Sexual Activity: Not on file   Other Topics Concern  . Not on file   Social History Narrative    No past surgical history on file.  Family History  Problem Relation Age of Onset  . Hypertension Mother     No Known Allergies  Current Outpatient Prescriptions on File Prior to Visit  Medication Sig Dispense Refill  . benzonatate (TESSALON) 100 MG capsule Take 1 capsule (100 mg total) by mouth 3 (three) times daily as needed. 21 capsule 0  . fluticasone (FLONASE)  50 MCG/ACT nasal spray Place 2 sprays into both nostrils daily. 16 g 1  . lisinopril (PRINIVIL,ZESTRIL) 10 MG tablet Take 1 tablet (10 mg total) by mouth daily. 30 tablet 3  . zolpidem (AMBIEN) 10 MG tablet Take 1 tablet (10 mg total) by mouth at bedtime as needed for sleep. 10 tablet 1     No current facility-administered medications on file prior to visit.    BP 124/79 mmHg  Pulse 103  Temp(Src) 99 F (37.2 C) (Oral)  Ht 5' 3.4" (1.61 m)  Wt 183 lb 12.8 oz (83.371 kg)  BMI 32.16 kg/m2  SpO2 98%       Assessment & Plan:

## 2014-09-13 NOTE — Progress Notes (Signed)
Pre visit review using our clinic review tool, if applicable. No additional management support is needed unless otherwise documented below in the visit note. 

## 2014-09-13 NOTE — Patient Instructions (Signed)
HTN (hypertension) Well controlled but since question of dry cough and on lisinopril, will discontinue lisinopril and rx losartan. Dry cough is possible with lisinopril and will eliminate this as possibility.   Allergic rhinitis Continue the flonase nasal spray.    Cough Now more than one month so will get cxr. Will also get neck films to rule out fb/fish bone.   I will have him stop benzonatate. Will rx hydromet short term.     Follow up in 2 wks or prn. With your new bp med I want to see if bp still controlled and see if your cough is resolved

## 2014-09-13 NOTE — Telephone Encounter (Signed)
Pt notified of neck xray findings. He was informed of his ct of the neck scheduled at 3 pm this Thursday downstairs. Advised if any problems before then notify us.

## 2014-09-13 NOTE — Assessment & Plan Note (Signed)
Well controlled but since question of dry cough and on lisinopril, will discontinue lisinopril and rx losartan. Dry cough is possible with lisinopril and will eliminate this as possibility.

## 2014-09-13 NOTE — Assessment & Plan Note (Signed)
Continue the flonase nasal spray.

## 2014-09-13 NOTE — Assessment & Plan Note (Signed)
Now more than one month so will get cxr. Will also get neck films to rule out fb/fish bone.   I will have him stop benzonatate. Will rx hydromet short term.

## 2014-09-13 NOTE — Telephone Encounter (Signed)
I called pt and let him know that I want to get Ct of his neck to r/o fish bones. Asked him to call me back.

## 2014-09-15 ENCOUNTER — Other Ambulatory Visit: Payer: Self-pay | Admitting: Medical

## 2014-09-15 ENCOUNTER — Ambulatory Visit (HOSPITAL_BASED_OUTPATIENT_CLINIC_OR_DEPARTMENT_OTHER)
Admission: RE | Admit: 2014-09-15 | Discharge: 2014-09-15 | Disposition: A | Discharge status: Home / Self Care | Payer: BLUE CROSS/BLUE SHIELD | Source: Ambulatory Visit | Attending: Medical | Admitting: Medical

## 2014-09-15 DIAGNOSIS — X58XXXA Exposure to other specified factors, initial encounter: Secondary | ICD-10-CM | POA: Insufficient documentation

## 2014-09-15 DIAGNOSIS — T18108A Unspecified foreign body in esophagus causing other injury, initial encounter: Secondary | ICD-10-CM

## 2014-09-15 DIAGNOSIS — R0989 Other specified symptoms and signs involving the circulatory and respiratory systems: Secondary | ICD-10-CM | POA: Insufficient documentation

## 2014-09-15 DIAGNOSIS — T17228A Food in pharynx causing other injury, initial encounter: Secondary | ICD-10-CM | POA: Diagnosis not present

## 2014-09-27 ENCOUNTER — Ambulatory Visit (INDEPENDENT_AMBULATORY_CARE_PROVIDER_SITE_OTHER): Payer: BLUE CROSS/BLUE SHIELD | Admitting: Medical

## 2014-09-27 ENCOUNTER — Encounter: Payer: Self-pay | Admitting: Medical

## 2014-09-27 VITALS — BP 139/83 | HR 72 | Temp 98.9°F | Ht 63.4 in | Wt 187.8 lb

## 2014-09-27 DIAGNOSIS — R05 Cough: Secondary | ICD-10-CM

## 2014-09-27 DIAGNOSIS — R059 Cough, unspecified: Secondary | ICD-10-CM

## 2014-09-27 MED ORDER — MONTELUKAST SODIUM 10 MG PO TABS: 10.0000 mg | ORAL_TABLET | Freq: Every day | ORAL | Status: DC

## 2014-09-27 MED ORDER — LORATADINE 10 MG PO TABS: 10.0000 mg | ORAL_TABLET | Freq: Every day | ORAL | Status: DC

## 2014-09-27 MED ORDER — FLUTICASONE PROPIONATE 50 MCG/ACT NA SUSP: 2.0000 | Freq: Every day | NASAL | Status: DC

## 2014-09-27 NOTE — Progress Notes (Signed)
Pre visit review using our clinic review tool, if applicable. No additional management support is needed unless otherwise documented below in the visit note. 

## 2014-09-27 NOTE — Progress Notes (Signed)
Subjective:    Patient ID: Willie Herrera, male    DOB: 04/28/1961, 54 y.o.   MRN: [id number]  HPI    Pt in for follow up. He states his cough did get better with hydromet. He states after he finished medicine for 3-4 days he had no cough. But then it came back. ACE inhibitor changed in past to rule out that as cause of cough. FB was speculated by pt but after imaging found not to be the case. Pt chest xray in the past recenlty was normal. Pt does not have asthma.       Review of Systems  Constitutional: Negative for fever, chills and fatigue.  HENT: Positive for congestion and postnasal drip.   Respiratory: Negative for cough, chest tightness, shortness of breath and wheezing.   Cardiovascular: Negative for chest pain and palpitations.  Musculoskeletal: Negative for back pain.  Neurological: Negative for dizziness, facial asymmetry, light-headedness and headaches.  Hematological: Negative for adenopathy. Does not bruise/bleed easily.   Past Medical History  Diagnosis Date  . HTN (hypertension) 08/25/2014    History   Social History  . Marital Status: Married    Spouse Name: N/A  . Number of Children: N/A  . Years of Education: N/A   Occupational History  . Not on file.   Social History Main Topics  . Smoking status: Former Games developermoker  . Smokeless tobacco: Never Used  . Alcohol Use: Yes  . Drug Use: Not on file  . Sexual Activity: Not on file   Other Topics Concern  . Not on file   Social History Narrative    No past surgical history on file.  Family History  Problem Relation Age of Onset  . Hypertension Mother     No Known Allergies  Current Outpatient Prescriptions on File Prior to Visit  Medication Sig Dispense Refill  . benzonatate (TESSALON) 100 MG capsule Take 1 capsule (100 mg total) by mouth 3 (three) times daily as needed. 21 capsule 0  . fluticasone (FLONASE) 50 MCG/ACT nasal spray Place 2 sprays into both nostrils daily. 16 g 1  .  HYDROcodone-homatropine (HYCODAN) 5-1.5 MG/5ML syrup Take 5 mLs by mouth every 8 (eight) hours as needed for cough. 120 mL 0  . losartan (COZAAR) 100 MG tablet Take 1 tablet (100 mg total) by mouth daily. 30 tablet 3  . zolpidem (AMBIEN) 10 MG tablet Take 1 tablet (10 mg total) by mouth at bedtime as needed for sleep. 10 tablet 1   No current facility-administered medications on file prior to visit.    BP 139/83 mmHg  Pulse 72  Temp(Src) 98.9 F (37.2 C) (Oral)  Ht 5' 3.4" (1.61 m)  Wt 187 lb 12.8 oz (85.186 kg)  BMI 32.86 kg/m2  SpO2 96%       Objective:   Physical Exam  General  Mental Status - Alert. General Appearance - Well groomed. Not in acute distress.  Skin Rashes- No Rashes.  HEENT Head- Normal. Ear Auditory Canal - Left- Normal. Right - Normal.Tympanic Membrane- Left- Normal. Right- Normal. Eye Sclera/Conjunctiva- Left- Normal. Right- Normal. Nose & Sinuses Nasal Mucosa- Left-  Boggy and Congested. Right-  Boggy and  Congested. No Bilateral maxillary or frontal sinus pressure. Mouth & Throat Lips: Upper Lip- Normal: no dryness, cracking, pallor, cyanosis, or vesicular eruption. Lower Lip-Normal: no dryness, cracking, pallor, cyanosis or vesicular eruption. Buccal Mucosa- Bilateral- No Aphthous ulcers. Oropharynx- No Discharge or Erythema. +pnd. Tonsils: Characteristics- Bilateral- No Erythema or Congestion. Size/Enlargement-  Bilateral- No enlargement. Discharge- bilateral-None.  Neck Neck- Supple. No Masses.   Chest and Lung Exam Auscultation: Breath Sounds:-Clear even and unlabored.  Cardiovascular Auscultation:Rythm- Regular, rate and rhythm. Murmurs & Other Heart Sounds:Ausculatation of the heart reveal- No Murmurs.  Lymphatic Head & Neck General Head & Neck Lymphatics: Bilateral: Description- No Localized lymphadenopathy.       Assessment & Plan:

## 2014-09-27 NOTE — Assessment & Plan Note (Signed)
After work up and trial of hydromet, I think you may have allergies related to chronic pnd. Possibly form allergies.  I will rx claritin, montelukast and flonase.  Also to rule out reflux component start zantac otc 150 mg twice daily. But start this in about 5 days after onset of others to see impact this has by itself.

## 2014-09-27 NOTE — Patient Instructions (Signed)
Cough After work up and trial of hydromet, I think you may have allergies related to chronic pnd. Possibly form allergies.  I will rx claritin, montelukast and flonase.  Also to rule out reflux component start zantac otc 150 mg twice daily. But start this in about 5 days after onset of others to see impact this has by itself.     Follow up in 3 wks or as needed.  If cough resolved with above call me before and could refill meds.  If cough persists then refer to pulmonologist or ENT.

## 2015-05-11 ENCOUNTER — Ambulatory Visit (INDEPENDENT_AMBULATORY_CARE_PROVIDER_SITE_OTHER): Payer: BLUE CROSS/BLUE SHIELD | Admitting: Medical

## 2015-05-11 VITALS — BP 128/86 | HR 74 | Temp 98.0°F | Ht 63.0 in | Wt 195.6 lb

## 2015-05-11 DIAGNOSIS — R748 Abnormal levels of other serum enzymes: Secondary | ICD-10-CM | POA: Diagnosis not present

## 2015-05-11 DIAGNOSIS — R600 Localized edema: Secondary | ICD-10-CM

## 2015-05-11 DIAGNOSIS — M25531 Pain in right wrist: Secondary | ICD-10-CM

## 2015-05-11 NOTE — Progress Notes (Signed)
   Subjective:    Patient ID: Willie Herrera, male    DOB: 1961/07/07, 54 y.o.   MRN: [id number]  HPI   Pt in today states he had  swelling of both hands and both feet last week(lasted for a couple of days). The hands and feet were not hurting(but does admit rt hand/palm pain on gripping). The bilateral  hand swelling is less than it was. No swelling now. His feet were swollen all the way up to  distal 1/3 portion of the tibia.   It never happened before. He had no swelling of abdomen. No sob, no wheezing, no rash or itching. No orthopnea reported  Pt was not tired. Good energy.   Pt does have hx of some liver enzyme elevation in past. I had wanted to repeat enzymes and get US abdomen. It appears on chart review test was never done.   Review of Systems  Constitutional: Negative for fever, chills and fatigue.  Respiratory: Negative for cough, chest tightness and shortness of breath.   Cardiovascular: Negative for chest pain and palpitations.  Musculoskeletal: Negative for back pain.       Recent and hand and feet swelling.  Hematological: Negative for adenopathy. Does not bruise/bleed easily.  Psychiatric/Behavioral: Negative for hallucinations, behavioral problems, confusion and dysphoric mood.       Objective:   Physical Exam  General Appearance- Not in acute distress.  HEENT Eyes- Scleraeral/Conjuntiva-bilat- Not Yellow. Mouth & Throat- Normal.  Chest and Lung Exam Auscultation: Breath sounds:-Normal. CTA. Adventitious sounds:- No Adventitious sounds.  Cardiovascular Auscultation:Rythm - Regular, Rate anf Rythm. Heart Sounds -Normal heart sounds.  Abdomen Inspection:-Inspection Normal.  Palpation/Perucssion: Palpation and Percussion of the abdomen reveal- none-Tender but distended appearance(possible ascites), No Rebound tenderness, No rigidity(Guarding) and No Palpable abdominal masses.  Liver:-Normal.  Spleen:- Normal.   Back- no cva tenderness.  Lower ext- faint 1+  pedal edema at most presently. Neg homans sign. No pain on palpation presently.  Rt hand and wrist- mild pain on palpation of ventral wrist and at base of the hand. Faint mild + phalens sign rt hand.(no obvious swelling presenlty)  Lt hand and wrist- no swelling.        Assessment & Plan:  For your hand and wrist pain will get xray of wrist. Use ibuprofen otc.  For your recent pedal edema present (minimally now) will get cxr. If re-occurs before I  want pt  to note if gone early morning  after arising from sleep but then re-occurs at end of the day.(this would indicate normal variant dependent edema)   For hx of elevated liver enzymes will get cmp and order US abdomen. (If liver enzymes increasing dramatic this could effect extremity  edema.) This is unlikely but I do think worthwhile to check.  Follow up in 2 wks or as needed

## 2015-05-11 NOTE — Patient Instructions (Addendum)
For your hand and wrist pain will get xray of wrist. Use ibuprofen otc.  For your recent pedal edema present (minimally now) will get cxr. If re-occurss before I  want him to note if gone early morning  after arising from sleep but then re-occurs at end of the day.(This would indicate normal variant dependent edema)   For hx of elevated liver enzymes will get cmp and order US abdomen. (If liver enzymes increasing dramatic this could effect extremity edema.) This is unlikely but I do think worthwhile to check.  Follow up in 2 wks or as needed

## 2015-05-11 NOTE — Progress Notes (Signed)
Pre visit review using our clinic review tool, if applicable. No additional management support is needed unless otherwise documented below in the visit note. 

## 2015-05-12 ENCOUNTER — Ambulatory Visit (HOSPITAL_BASED_OUTPATIENT_CLINIC_OR_DEPARTMENT_OTHER)
Admission: RE | Admit: 2015-05-12 | Discharge: 2015-05-12 | Disposition: A | Discharge status: Home / Self Care | Payer: BLUE CROSS/BLUE SHIELD | Source: Ambulatory Visit | Attending: Medical | Admitting: Medical

## 2015-05-12 ENCOUNTER — Other Ambulatory Visit (INDEPENDENT_AMBULATORY_CARE_PROVIDER_SITE_OTHER): Payer: BLUE CROSS/BLUE SHIELD

## 2015-05-12 DIAGNOSIS — Z87891 Personal history of nicotine dependence: Secondary | ICD-10-CM | POA: Insufficient documentation

## 2015-05-12 DIAGNOSIS — R7989 Other specified abnormal findings of blood chemistry: Secondary | ICD-10-CM | POA: Diagnosis not present

## 2015-05-12 DIAGNOSIS — R748 Abnormal levels of other serum enzymes: Secondary | ICD-10-CM | POA: Diagnosis not present

## 2015-05-12 DIAGNOSIS — R600 Localized edema: Secondary | ICD-10-CM

## 2015-05-12 DIAGNOSIS — I10 Essential (primary) hypertension: Secondary | ICD-10-CM | POA: Diagnosis not present

## 2015-05-12 DIAGNOSIS — M25531 Pain in right wrist: Secondary | ICD-10-CM

## 2015-05-12 DIAGNOSIS — R140 Abdominal distension (gaseous): Secondary | ICD-10-CM | POA: Insufficient documentation

## 2015-05-12 LAB — COMPREHENSIVE METABOLIC PANEL
ALBUMIN: 4.1 g/dL (ref 3.5–5.2)
ALK PHOS: 57 U/L (ref 39–117)
ALT: 93 U/L — ABNORMAL HIGH (ref 0–53)
AST: 75 U/L — AB (ref 0–37)
BILIRUBIN TOTAL: 0.7 mg/dL (ref 0.2–1.2)
BUN: 12 mg/dL (ref 6–23)
CALCIUM: 9.2 mg/dL (ref 8.4–10.5)
CO2: 24 mEq/L (ref 19–32)
CREATININE: 1.13 mg/dL (ref 0.40–1.50)
Chloride: 100 mEq/L (ref 96–112)
GFR: 72.48 mL/min (ref >60.00)
Glucose, Bld: 91 mg/dL (ref 70–99)
Potassium: 3.8 mEq/L (ref 3.5–5.1)
SODIUM: 135 meq/L (ref 135–145)
TOTAL PROTEIN: 8.2 g/dL (ref 6.0–8.3)

## 2015-05-26 ENCOUNTER — Telehealth: Payer: Self-pay | Admitting: Medical

## 2015-05-26 ENCOUNTER — Encounter: Payer: BLUE CROSS/BLUE SHIELD | Admitting: Medical

## 2015-05-26 DIAGNOSIS — Z0289 Encounter for other administrative examinations: Secondary | ICD-10-CM

## 2015-05-26 NOTE — Progress Notes (Signed)
This encounter was created in error - please disregard.

## 2015-06-05 ENCOUNTER — Ambulatory Visit (INDEPENDENT_AMBULATORY_CARE_PROVIDER_SITE_OTHER): Payer: BLUE CROSS/BLUE SHIELD | Admitting: Medical

## 2015-06-05 ENCOUNTER — Encounter: Payer: Self-pay | Admitting: Medical

## 2015-06-05 VITALS — BP 122/82 | HR 76 | Temp 98.1°F | Resp 16 | Ht 64.5 in | Wt 197.6 lb

## 2015-06-05 DIAGNOSIS — R609 Edema, unspecified: Secondary | ICD-10-CM | POA: Diagnosis not present

## 2015-06-05 MED ORDER — HYDROCHLOROTHIAZIDE 12.5 MG PO CAPS: ORAL_CAPSULE | ORAL | Status: DC

## 2015-06-05 NOTE — Patient Instructions (Signed)
You had  negative labs,  US, and cxr.(No obvious cause of edema found such as chf or other severe abnormality).  You describe dependent edema. I will rx diuretic that you can use sparingly on occasion of significant edema. Also will rx [facility] compression stocking. You should also be able to get them otc at [facility].  Still watch for weight gain with pedal edema, pain behind calves/popliteal or shortness of breath. In this event you would need repeat labs.  Follow up in 2-3 months or as needed.

## 2015-06-05 NOTE — Progress Notes (Signed)
Subjective:    Patient ID: Willie Herrera, male    DOB: 01/23/1961, 54 y.o.   MRN: [id number]  HPI  Pt in for follow up. Pt last chest xray was negative. His US of abdomen showed some likely fat on his liver. Mild liver enzymes(which is not new and stable). Test were done due to his complaint of some pedal edema to his leg bilaterally)  He states he had resolution of his prior pedal edema for about 2.5 wks but  2 days ago it came back. No shortness of breath, no wheezing, and no orthopnea.  Pt has no popliteal pain. He states he does not note relation to diet/na filled foods.  Last 3 days when he wakes up he states it does look normal. By late morning and early afternoon edema returned.   Review of Systems  Constitutional: Negative for fever, chills, diaphoresis, activity change and fatigue.  Respiratory: Negative for cough, chest tightness and shortness of breath.   Cardiovascular: Negative for chest pain, palpitations and leg swelling.  Gastrointestinal: Negative for nausea, vomiting and abdominal pain.  Musculoskeletal: Negative for neck pain and neck stiffness.       Mid pretibial edema.  Neurological: Negative for dizziness, tremors, seizures, syncope, facial asymmetry, speech difficulty, weakness, light-headedness, numbness and headaches.  Psychiatric/Behavioral: Negative for behavioral problems, confusion and agitation. The patient is not nervous/anxious.      Past Medical History  Diagnosis Date  . HTN (hypertension) 08/25/2014    Social History   Social History  . Marital Status: Married    Spouse Name: N/A  . Number of Children: N/A  . Years of Education: N/A   Occupational History  . Not on file.   Social History Main Topics  . Smoking status: Former Games developermoker  . Smokeless tobacco: Never Used  . Alcohol Use: Yes  . Drug Use: Not on file  . Sexual Activity: Not on file   Other Topics Concern  . Not on file   Social History Narrative    No past surgical  history on file.  Family History  Problem Relation Age of Onset  . Hypertension Mother     No Known Allergies  No current outpatient prescriptions on file prior to visit.   No current facility-administered medications on file prior to visit.    BP 122/82 mmHg  Pulse 76  Temp(Src) 98.1 F (36.7 C) (Oral)  Resp 16  Ht 5' 4.5" (1.638 m)  Wt 197 lb 9.6 oz (89.631 kg)  BMI 33.41 kg/m2  SpO2 98%       Objective:   Physical Exam  General Mental Status- Alert. General Appearance- Not in acute distress.   Skin General: Color- Normal Color. Moisture- Normal Moisture.  Neck Carotid Arteries- Normal color. Moisture- Normal Moisture. No carotid bruits. No JVD.  Chest and Lung Exam Auscultation: Breath Sounds:-Normal.  Cardiovascular Auscultation:Rythm- Regular. Murmurs & Other Heart Sounds:Auscultation of the heart reveals- No Murmurs.  Abdomen Inspection:-Inspeection Normal. Palpation/Percussion:Note:No mass. Palpation and Percussion of the abdomen reveal- Non Tender, Non Distended + BS, no rebound or guarding.    Neurologic Cranial Nerve exam:- CN III-XII intact(No nystagmus), symmetric smile. Strength:- 5/5 equal and symmetric strength both upper and lower extremities.  Lower ext- 1+ pedal edema mid aspect pretibial region. Negative homans signs. Calves  symmetric.      Assessment & Plan:  You had  negative labs,  US, and cxr.(No obvious cause of edema found such as chf or other severe abnormality).  You  describe dependent edema. I will rx diuretic that you can use sparingly on occasion of significant edema. Also will rx [facility] compression stocking. You should also be able to get them otc at [facility].  Still watch for weight gain with pedal edema, pain behind calves/popliteal or shortness of breath. In this event you would need repeat labs.  Follow up in 2-3 months or as needed.  Note pt was educated about sparing use of hctz. Use one tablet then wait 2-3  days to see impact. Use this in conjunction with ted hose stockings.

## 2015-06-05 NOTE — Progress Notes (Signed)
Pre visit review using our clinic review tool, if applicable. No additional management support is needed unless otherwise documented below in the visit note. 

## 2015-06-07 NOTE — Telephone Encounter (Signed)
charge 

## 2015-06-07 NOTE — Telephone Encounter (Signed)
Pt was no show 05/26/15 8:30am, cpe appt, pt came in 06/05/15, charge or no charge?

## 2015-09-13 ENCOUNTER — Telehealth: Payer: Self-pay | Admitting: Medical

## 2015-09-13 NOTE — Telephone Encounter (Signed)
Ph # is not reachable for flu update.

## 2017-10-01 ENCOUNTER — Encounter (HOSPITAL_BASED_OUTPATIENT_CLINIC_OR_DEPARTMENT_OTHER): Payer: Self-pay

## 2017-10-01 ENCOUNTER — Emergency Department (HOSPITAL_BASED_OUTPATIENT_CLINIC_OR_DEPARTMENT_OTHER): Payer: Self-pay

## 2017-10-01 ENCOUNTER — Other Ambulatory Visit: Payer: Self-pay

## 2017-10-01 ENCOUNTER — Inpatient Hospital Stay (HOSPITAL_BASED_OUTPATIENT_CLINIC_OR_DEPARTMENT_OTHER)
Admission: EM | Admit: 2017-10-01 | Discharge: 2017-10-02 | DRG: 065 | Disposition: A | Discharge status: Rehabilitation Facility | Payer: Self-pay | Attending: Internal Medicine | Admitting: Internal Medicine

## 2017-10-01 DIAGNOSIS — F17200 Nicotine dependence, unspecified, uncomplicated: Secondary | ICD-10-CM

## 2017-10-01 DIAGNOSIS — R29810 Facial weakness: Secondary | ICD-10-CM | POA: Diagnosis present

## 2017-10-01 DIAGNOSIS — G8194 Hemiplegia, unspecified affecting left nondominant side: Secondary | ICD-10-CM | POA: Diagnosis present

## 2017-10-01 DIAGNOSIS — I160 Hypertensive urgency: Secondary | ICD-10-CM

## 2017-10-01 DIAGNOSIS — R471 Dysarthria and anarthria: Secondary | ICD-10-CM | POA: Diagnosis present

## 2017-10-01 DIAGNOSIS — I639 Cerebral infarction, unspecified: Principal | ICD-10-CM | POA: Diagnosis present

## 2017-10-01 DIAGNOSIS — F1010 Alcohol abuse, uncomplicated: Secondary | ICD-10-CM | POA: Diagnosis present

## 2017-10-01 DIAGNOSIS — F17210 Nicotine dependence, cigarettes, uncomplicated: Secondary | ICD-10-CM | POA: Diagnosis present

## 2017-10-01 DIAGNOSIS — E785 Hyperlipidemia, unspecified: Secondary | ICD-10-CM | POA: Diagnosis present

## 2017-10-01 DIAGNOSIS — I672 Cerebral atherosclerosis: Secondary | ICD-10-CM | POA: Diagnosis present

## 2017-10-01 HISTORY — DX: Hyperlipidemia, unspecified: E78.5

## 2017-10-01 HISTORY — DX: Alcohol abuse, uncomplicated: F10.10

## 2017-10-01 LAB — RAPID URINE DRUG SCREEN, HOSP PERFORMED
Amphetamines: NOT DETECTED
BENZODIAZEPINES: NOT DETECTED
Barbiturates: NOT DETECTED
COCAINE: NOT DETECTED
Opiates: NOT DETECTED
Tetrahydrocannabinol: NOT DETECTED

## 2017-10-01 LAB — COMPREHENSIVE METABOLIC PANEL
ALBUMIN: 4.2 g/dL (ref 3.5–5.0)
ALT: 29 U/L (ref 17–63)
ANION GAP: 11 (ref 5–15)
AST: 32 U/L (ref 15–41)
Alkaline Phosphatase: 71 U/L (ref 38–126)
BILIRUBIN TOTAL: 0.7 mg/dL (ref 0.3–1.2)
BUN: 15 mg/dL (ref 6–20)
CHLORIDE: 100 mmol/L — AB (ref 101–111)
CO2: 22 mmol/L (ref 22–32)
Calcium: 8.8 mg/dL — ABNORMAL LOW (ref 8.9–10.3)
Creatinine, Ser: 1.04 mg/dL (ref 0.61–1.24)
GFR calc Af Amer: >60 mL/min (ref >60)
Glucose, Bld: 104 mg/dL — ABNORMAL HIGH (ref 65–99)
POTASSIUM: 3.5 mmol/L (ref 3.5–5.1)
Sodium: 133 mmol/L — ABNORMAL LOW (ref 135–145)
TOTAL PROTEIN: 8.6 g/dL — AB (ref 6.5–8.1)

## 2017-10-01 LAB — URINALYSIS, MICROSCOPIC (REFLEX)
RBC / HPF: NONE SEEN RBC/hpf (ref 0–5)
WBC UA: NONE SEEN WBC/hpf (ref 0–5)

## 2017-10-01 LAB — APTT: APTT: 30 s (ref 24–36)

## 2017-10-01 LAB — PROTIME-INR
INR: 0.93
PROTHROMBIN TIME: 12.4 s (ref 11.4–15.2)

## 2017-10-01 LAB — CBC
HCT: 42.5 % (ref 39.0–52.0)
Hemoglobin: 14.9 g/dL (ref 13.0–17.0)
MCH: 25.8 pg — ABNORMAL LOW (ref 26.0–34.0)
MCHC: 35.1 g/dL (ref 30.0–36.0)
MCV: 73.5 fL — ABNORMAL LOW (ref 78.0–100.0)
PLATELETS: 228 10*3/uL (ref 150–400)
RBC: 5.78 MIL/uL (ref 4.22–5.81)
RDW: 14.9 % (ref 11.5–15.5)
WBC: 9.1 10*3/uL (ref 4.0–10.5)

## 2017-10-01 LAB — URINALYSIS, ROUTINE W REFLEX MICROSCOPIC
BILIRUBIN URINE: NEGATIVE
GLUCOSE, UA: NEGATIVE mg/dL
HGB URINE DIPSTICK: NEGATIVE
Ketones, ur: NEGATIVE mg/dL
Leukocytes, UA: NEGATIVE
Nitrite: NEGATIVE
PH: 7 (ref 5.0–8.0)
Protein, ur: 30 mg/dL — AB
SPECIFIC GRAVITY, URINE: 1.01 (ref 1.005–1.030)

## 2017-10-01 LAB — DIFFERENTIAL
BASOS ABS: 0 10*3/uL (ref 0.0–0.1)
BASOS PCT: 0 %
EOS ABS: 0.2 10*3/uL (ref 0.0–0.7)
Eosinophils Relative: 2 %
LYMPHS ABS: 2.6 10*3/uL (ref 0.7–4.0)
Lymphocytes Relative: 29 %
MONO ABS: 1 10*3/uL (ref 0.1–1.0)
Monocytes Relative: 11 %
Neutro Abs: 5.3 10*3/uL (ref 1.7–7.7)
Neutrophils Relative %: 58 %

## 2017-10-01 LAB — ETHANOL

## 2017-10-01 LAB — CBG MONITORING, ED
GLUCOSE-CAPILLARY: 97 mg/dL (ref 65–99)
Glucose-Capillary: 100 mg/dL — ABNORMAL HIGH (ref 65–99)

## 2017-10-01 LAB — TROPONIN I

## 2017-10-01 MED ORDER — ASPIRIN 300 MG RE SUPP: 300.0000 mg | Freq: Every day | RECTAL | Status: DC

## 2017-10-01 MED ORDER — ACETAMINOPHEN 160 MG/5ML PO SOLN: 650.0000 mg | ORAL | Status: DC | PRN

## 2017-10-01 MED ORDER — ACETAMINOPHEN 650 MG RE SUPP: 650.0000 mg | RECTAL | Status: DC | PRN

## 2017-10-01 MED ORDER — ASPIRIN 325 MG PO TABS
325.0000 mg | ORAL_TABLET | Freq: Every day | ORAL | Status: DC
  Administered 2017-10-02: 325 mg via ORAL
  Filled 2017-10-01: qty 1

## 2017-10-01 MED ORDER — IOPAMIDOL (ISOVUE-370) INJECTION 76%
100.0000 mL | Freq: Once | INTRAVENOUS | Status: AC | PRN
  Administered 2017-10-01: 100 mL via INTRAVENOUS

## 2017-10-01 MED ORDER — STROKE: EARLY STAGES OF RECOVERY BOOK
Freq: Once | Status: AC
  Administered 2017-10-01: 22:00:00

## 2017-10-01 MED ORDER — LABETALOL HCL 5 MG/ML IV SOLN
5.0000 mg | INTRAVENOUS | Status: DC | PRN
  Administered 2017-10-01 – 2017-10-02 (×2): 5 mg via INTRAVENOUS
  Filled 2017-10-01 (×2): qty 4

## 2017-10-01 MED ORDER — ENOXAPARIN SODIUM 40 MG/0.4ML ~~LOC~~ SOLN: 40.0000 mg | SUBCUTANEOUS | Status: DC

## 2017-10-01 MED ORDER — SODIUM CHLORIDE 0.9 % IV SOLN: INTRAVENOUS | Status: DC

## 2017-10-01 MED ORDER — ACETAMINOPHEN 325 MG PO TABS
650.0000 mg | ORAL_TABLET | ORAL | Status: DC | PRN
Start: 2017-10-01 — End: 2017-10-02
  Administered 2017-10-01: 650 mg via ORAL
  Filled 2017-10-01: qty 2

## 2017-10-01 MED ORDER — ASPIRIN 325 MG PO TABS
325.0000 mg | ORAL_TABLET | Freq: Once | ORAL | Status: AC
  Administered 2017-10-01: 325 mg via ORAL
  Filled 2017-10-01: qty 1

## 2017-10-01 MED ORDER — SENNOSIDES-DOCUSATE SODIUM 8.6-50 MG PO TABS: 1.0000 | ORAL_TABLET | Freq: Every evening | ORAL | Status: DC | PRN

## 2017-10-01 NOTE — ED Notes (Signed)
Patient is past the CODE STROKE.  He stated that he has weakness to his left side at 2 am this morning and came her this afternoon.  Negative for VAN.

## 2017-10-01 NOTE — ED Notes (Signed)
Pt was able to stand take steps to scale-pt unsteady

## 2017-10-01 NOTE — ED Provider Notes (Signed)
Blood pressure (!) 194/111, pulse 80, temperature 97.6 F (36.4 C), resp. rate 18, height 5\' 4"  (1.626 m), weight 82.6 kg (182 lb), SpO2 97 %.  Assuming care from Dr. Ethelda ChickJacubowitz.  In short, Willie Herrera is a 57 y.o. male with a chief complaint of Weakness .  Refer to the original H&P for additional details.  The current plan of care is to follow CTA and discuss with the hospitalist.  04:40 PM Discussed patient's case with Hospitalist to request admission. Patient and family (if present) updated with plan. Care transferred to Hospitalist service.  I reviewed all nursing notes, vitals, pertinent old records, EKGs, labs, imaging (as available).  Reviewed the CT angios which showed no large vessel occlusion.  Will allow the patient to be permissively hypertensive.  [facility] as per above discussion.    EKG Interpretation  Date/Time:  Wednesday October 01 2017 14:03:01 EST Ventricular Rate:  80 PR Interval:    QRS Duration: 110 QT Interval:  380 QTC Calculation: 439 R Axis:   -27 Text Interpretation:  Sinus rhythm Left atrial enlargement RSR' in V1 or V2, right VCD or RVH Inferior infarct, old Minimal ST elevation, anterior leads Lateral leads are also involved Baseline wander in lead(s) V4 V6 No old tracing to compare Confirmed by Mission BendJacubowitz, Doreatha MartinSam [phone] on 10/01/2017 2:23:54 PM      Alona BeneJoshua Stepan Verrette, MD   Maia PlanLong, Leianna Barga G, MD 10/01/17 [phone]

## 2017-10-01 NOTE — H&P (Signed)
History and Physical    Willie Herrera WJX:[id number] DOB: Oct 08, 1960 DOA: 10/01/2017  PCP: Esperanza Richters, PA-C   Patient coming from: Home, by way of [facility]   Chief Complaint: Left-sided weakness   HPI: Willie Herrera is a 57 y.o. male who denies any significant past medical history while acknowledging he has not seen a doctor in 2 or 3 years, now presenting to the emergency department for evaluation of weakness involving the left arm and left leg.  Patient reports that he went to bed in his usual state of health last night, woke shortly after midnight to smoke a cigarette, but had weakness involving the left leg, causing him to fall without hitting his head or losing consciousness.  He also noted left arm weakness at that time.  Symptoms persisted and he eventually sought evaluation in the emergency department.  He denies any chest pain or palpitations, denies headache, and denies change in vision or hearing.  He has never experienced similar symptoms previously.  [facility] ED Course: Upon arrival to the ED, patient is found to be afebrile, saturating well on room air, hypertensive to 200/120, and vitals otherwise normal.  EKG features a sinus rhythm with diffuse ST-ST abnormalities and no prior for comparison.  Chest x-ray is notable for mild pulmonary vascular congestion without overt edema.  Noncontrast head CT reveals subtle hypoattenuation involving the right caudate head and anterior right insular cortex concerning for acute/subacute ischemia.  CTA head and neck is negative for emergent large vessel occlusion, but notable for mild to moderate intracranial atherosclerosis.  Chemistry panel reveals a slight hyponatremia and CBC is notable for microcytosis without anemia.  Troponin is undetectable and urinalysis is unremarkable.  Neurology was consulted by the ED physician and recommended medical admission.  Patient remains hypertensive, but otherwise stable and will be admitted to the telemetry  unit for ongoing evaluation and management left-sided weakness secondary to acute ischemic CVA.  Review of Systems:  All other systems reviewed and apart from HPI, are negative.  Past Medical History:  Diagnosis Date  . HTN (hypertension) 08/25/2014    History reviewed. No pertinent surgical history.   reports that he has been smoking.  he has never used smokeless tobacco. He reports that he drinks alcohol. He reports that he does not use drugs.  No Known Allergies  Family History  Problem Relation Age of Onset  . Hypertension Mother   . Stroke Maternal Grandfather      Prior to Admission medications   Not on File    Physical Exam: Vitals:   10/01/17 1630 10/01/17 1700 10/01/17 1730 10/01/17 2019  BP: (!) 178/121 (!) 184/118 (!) 192/125 (!) 190/119  Pulse: 77 77 72 82  Resp: 18 17 15 18   Temp:    98.1 F (36.7 C)  TempSrc:    Oral  SpO2: 95% 97% 98% 98%  Weight:      Height:          Constitutional: NAD, calm  Eyes: PERTLA, lids and conjunctivae normal ENMT: Mucous membranes are moist. Posterior pharynx clear of any exudate or lesions.   Neck: normal, supple, no masses, no thyromegaly Respiratory: clear to auscultation bilaterally, no wheezing, no crackles. Normal respiratory effort.  Cardiovascular: S1 & S2 heard, regular rate and rhythm. No significant JVD. Abdomen: No distension, no tenderness, no masses palpated. Bowel sounds normal.  Musculoskeletal: no clubbing / cyanosis. No joint deformity upper and lower extremities.    Skin: no significant rashes, lesions, ulcers.  Warm, dry, well-perfused. Neurologic: CN 2-12 grossly intact. Sensation to light touch intact. Left arm and leg weakness, 3-4/5.   Psychiatric: Alert and oriented x 3. Pleasant and cooperative.     Labs on Admission: I have personally reviewed following labs and imaging studies  CBC: Recent Labs  Lab 10/01/17 1415  WBC 9.1  NEUTROABS 5.3  HGB 14.9  HCT 42.5  MCV 73.5*  PLT 228    Basic Metabolic Panel: Recent Labs  Lab 10/01/17 1415  NA 133*  K 3.5  CL 100*  CO2 22  GLUCOSE 104*  BUN 15  CREATININE 1.04  CALCIUM 8.8*   GFR: Estimated Creatinine Clearance: 78.8 mL/min (by C-G formula based on SCr of 1.04 mg/dL). Liver Function Tests: Recent Labs  Lab 10/01/17 1415  AST 32  ALT 29  ALKPHOS 71  BILITOT 0.7  PROT 8.6*  ALBUMIN 4.2   No results for input(s): LIPASE, AMYLASE in the last 168 hours. No results for input(s): AMMONIA in the last 168 hours. Coagulation Profile: Recent Labs  Lab 10/01/17 1415  INR 0.93   Cardiac Enzymes: Recent Labs  Lab 10/01/17 1415  TROPONINI <0.03   BNP (last 3 results) No results for input(s): PROBNP in the last 8760 hours. HbA1C: No results for input(s): HGBA1C in the last 72 hours. CBG: Recent Labs  Lab 10/01/17 1354 10/01/17 1416  GLUCAP 97 100*   Lipid Profile: No results for input(s): CHOL, HDL, LDLCALC, TRIG, CHOLHDL, LDLDIRECT in the last 72 hours. Thyroid Function Tests: No results for input(s): TSH, T4TOTAL, FREET4, T3FREE, THYROIDAB in the last 72 hours. Anemia Panel: No results for input(s): VITAMINB12, FOLATE, FERRITIN, TIBC, IRON, RETICCTPCT in the last 72 hours. Urine analysis:    Component Value Date/Time   COLORURINE YELLOW 10/01/2017 1600   APPEARANCEUR CLEAR 10/01/2017 1600   LABSPEC 1.010 10/01/2017 1600   PHURINE 7.0 10/01/2017 1600   GLUCOSEU NEGATIVE 10/01/2017 1600   HGBUR NEGATIVE 10/01/2017 1600   BILIRUBINUR NEGATIVE 10/01/2017 1600   KETONESUR NEGATIVE 10/01/2017 1600   PROTEINUR 30 (A) 10/01/2017 1600   NITRITE NEGATIVE 10/01/2017 1600   LEUKOCYTESUR NEGATIVE 10/01/2017 1600   Sepsis Labs: @LABRCNTIP (procalcitonin:4,lacticidven:4) )No results found for this or any previous visit (from the past 240 hour(s)).   Radiological Exams on Admission: Ct Angio Head W Or Wo Contrast  Result Date: 10/01/2017 CLINICAL DATA:  57 year old male with left extremity  weakness beginning at 0200 hours today. EXAM: CT ANGIOGRAPHY HEAD AND NECK TECHNIQUE: Multidetector CT imaging of the head and neck was performed using the standard protocol during bolus administration of intravenous contrast. Multiplanar CT image reconstructions and MIPs were obtained to evaluate the vascular anatomy. Carotid stenosis measurements (when applicable) are obtained utilizing NASCET criteria, using the distal internal carotid diameter as the denominator. CONTRAST:  ISOVUE-370 IOPAMIDOL (ISOVUE-370) INJECTION 76% COMPARISON:  Head CT without contrast 1437 hours today. FINDINGS: CTA NECK Skeleton: No acute osseous abnormality identified. Cervical spine disc, endplate, and facet degeneration. Upper chest: Bilateral upper lung atelectasis. No superior mediastinal lymphadenopathy. Other neck: Negative; partially retropharyngeal course of both carotids-more so the left. No neck mass or lymphadenopathy. Aortic arch: 3 vessel arch configuration with mild arch atherosclerosis distal to the left subclavian origin. No great vessel origin atherosclerosis or stenosis. Right carotid system: Negative aside from mild tortuosity. Widely patent right carotid bifurcation. Left carotid system: Negative aside from tortuosity. Widely patent left carotid bifurcation. Vertebral arteries: Normal proximal right subclavian artery. The right vertebral artery is non dominant and  diminutive but patent from its origin to the skull base without stenosis identified. Normal proximal left subclavian artery. The left vertebral artery origin is normal (series 8, image 171). The left vertebral is dominant. The left V1 segment is tortuous with a kinked appearance, but there is otherwise no cervical left vertebral artery stenosis. CTA HEAD Posterior circulation: Dominant distal left vertebral artery primarily supplies the basilar. The distal right vertebral artery functionally terminates in PICA. Normal left PICA origin. Patent basilar  artery without stenosis. Normal SCA origins (duplicated on the right). There are fetal type bilateral PCA origins. Bilateral PCA branches are within normal limits. Anterior circulation: Both ICA siphons are patent. There is mild to moderate bilateral ICA siphon calcified plaque. No hemodynamically significant stenosis occurs on the left. Mild if any right ICA siphon stenosis in the cavernous and supraclinoid segments. The bilateral ophthalmic and posterior communicating artery origins are normal. Patent carotid termini. Normal MCA and ACA origins. Anterior communicating artery is diminutive or absent. Bilateral ACA branches are within normal limits. Left MCA M1 segment and bifurcation are patent. There is mild irregularity and stenosis at the dominant posterior left M2 origin best seen on series 11, image 19. Left MCA branches otherwise are within normal limits. The right MCA M1 segment and bifurcation are patent and appear normal. Right MCA branches are within normal limits. Venous sinuses: Patent. Anatomic variants: Dominant left vertebral artery, the right terminates in PICA. Fetal type bilateral PCA origins. Delayed phase: Age indeterminate hypodensity re-demonstrated at the head of the caudate nucleus (series 13, image 15). Hypodensity also along the anterior limb of the left external capsule appears stable. No cortically based acute infarct identified. No abnormal enhancement identified. Review of the MIP images confirms the above findings IMPRESSION: 1. Negative for emergent large vessel occlusion. No hemodynamically significant arterial stenosis in the head or neck. 2. No significant extracranial atherosclerosis. Mild to moderate intracranial atherosclerosis most apparent at the ICA siphons and left MCA bifurcation. 3. Stable CT appearance of the brain from 1437 hours today. Age indeterminate small vessel ischemia in the right basal ganglia and left deep white matter capsules. Electronically Signed   By: Odessa Fleming M.D.   On: 10/01/2017 16:09   Dg Chest 2 View  Result Date: 10/01/2017 CLINICAL DATA:  57 year old male with left side weakness beginning at 0200 hours. EXAM: CHEST  2 VIEW COMPARISON:  Chest radiographs 05/12/2015. FINDINGS: AP and lateral views of the chest. Lung volumes are stable and within normal limits. Mediastinal contours remain normal. Visualized tracheal air column is within normal limits. No pneumothorax, pleural effusion or confluent pulmonary opacity. There is bilateral pulmonary vascular congestion, but no overt edema. No acute osseous abnormality identified. Negative visible bowel gas pattern. IMPRESSION: Mild pulmonary vascular congestion without overt edema. No other acute findings. Electronically Signed   By: Odessa Fleming M.D.   On: 10/01/2017 15:16   Ct Angio Neck W And/or Wo Contrast  Result Date: 10/01/2017 CLINICAL DATA:  57 year old male with left extremity weakness beginning at 0200 hours today. EXAM: CT ANGIOGRAPHY HEAD AND NECK TECHNIQUE: Multidetector CT imaging of the head and neck was performed using the standard protocol during bolus administration of intravenous contrast. Multiplanar CT image reconstructions and MIPs were obtained to evaluate the vascular anatomy. Carotid stenosis measurements (when applicable) are obtained utilizing NASCET criteria, using the distal internal carotid diameter as the denominator. CONTRAST:  ISOVUE-370 IOPAMIDOL (ISOVUE-370) INJECTION 76% COMPARISON:  Head CT without contrast 1437 hours today. FINDINGS: CTA  NECK Skeleton: No acute osseous abnormality identified. Cervical spine disc, endplate, and facet degeneration. Upper chest: Bilateral upper lung atelectasis. No superior mediastinal lymphadenopathy. Other neck: Negative; partially retropharyngeal course of both carotids-more so the left. No neck mass or lymphadenopathy. Aortic arch: 3 vessel arch configuration with mild arch atherosclerosis distal to the left subclavian origin. No great  vessel origin atherosclerosis or stenosis. Right carotid system: Negative aside from mild tortuosity. Widely patent right carotid bifurcation. Left carotid system: Negative aside from tortuosity. Widely patent left carotid bifurcation. Vertebral arteries: Normal proximal right subclavian artery. The right vertebral artery is non dominant and diminutive but patent from its origin to the skull base without stenosis identified. Normal proximal left subclavian artery. The left vertebral artery origin is normal (series 8, image 171). The left vertebral is dominant. The left V1 segment is tortuous with a kinked appearance, but there is otherwise no cervical left vertebral artery stenosis. CTA HEAD Posterior circulation: Dominant distal left vertebral artery primarily supplies the basilar. The distal right vertebral artery functionally terminates in PICA. Normal left PICA origin. Patent basilar artery without stenosis. Normal SCA origins (duplicated on the right). There are fetal type bilateral PCA origins. Bilateral PCA branches are within normal limits. Anterior circulation: Both ICA siphons are patent. There is mild to moderate bilateral ICA siphon calcified plaque. No hemodynamically significant stenosis occurs on the left. Mild if any right ICA siphon stenosis in the cavernous and supraclinoid segments. The bilateral ophthalmic and posterior communicating artery origins are normal. Patent carotid termini. Normal MCA and ACA origins. Anterior communicating artery is diminutive or absent. Bilateral ACA branches are within normal limits. Left MCA M1 segment and bifurcation are patent. There is mild irregularity and stenosis at the dominant posterior left M2 origin best seen on series 11, image 19. Left MCA branches otherwise are within normal limits. The right MCA M1 segment and bifurcation are patent and appear normal. Right MCA branches are within normal limits. Venous sinuses: Patent. Anatomic variants: Dominant left  vertebral artery, the right terminates in PICA. Fetal type bilateral PCA origins. Delayed phase: Age indeterminate hypodensity re-demonstrated at the head of the caudate nucleus (series 13, image 15). Hypodensity also along the anterior limb of the left external capsule appears stable. No cortically based acute infarct identified. No abnormal enhancement identified. Review of the MIP images confirms the above findings IMPRESSION: 1. Negative for emergent large vessel occlusion. No hemodynamically significant arterial stenosis in the head or neck. 2. No significant extracranial atherosclerosis. Mild to moderate intracranial atherosclerosis most apparent at the ICA siphons and left MCA bifurcation. 3. Stable CT appearance of the brain from 1437 hours today. Age indeterminate small vessel ischemia in the right basal ganglia and left deep white matter capsules. Electronically Signed   By: Odessa Fleming M.D.   On: 10/01/2017 16:09   Ct Head Code Stroke Wo Contrast  Result Date: 10/01/2017 CLINICAL DATA:  Code stroke. Acute onset of left upper and lower extremity weakness beginning at 2 a.m. today, 12 hours ago. EXAM: CT HEAD WITHOUT CONTRAST TECHNIQUE: Contiguous axial images were obtained from the base of the skull through the vertex without intravenous contrast. COMPARISON:  None. FINDINGS: Brain: Subtle hypoattenuation is present in the right caudate head and anterior right insular cortex. Asymmetric white matter hypoattenuation is present in the left external capsule. Other scattered subcortical white matter hypoattenuation is present. No other focal cortical abnormality is present. No acute hemorrhage or mass lesion is present. The ventricles are of normal size.  No significant extra-axial fluid collection is present. Vascular: Atherosclerotic calcifications are present within the cavernous internal carotid arteries bilaterally. There is no hyperdense vessel. : Calvarium is intact. No focal lytic or blastic lesions are  present. Sinuses/Orbits: The paranasal sinuses and mastoid air cells are clear. Visualized globes and orbits are within normal limits. Other: ASPECTS (Alberta Stroke Program Early CT Score) - Ganglionic level infarction (caudate, lentiform nuclei, internal capsule, insula, M1-M3 cortex): 5/7 - Supraganglionic infarction (M4-M6 cortex): 3/3 Total score (0-10 with 10 being normal): 10/10 IMPRESSION: 1. Subtle hypoattenuation involving the right caudate head and anterior right insular cortex is concerning for acute/subacute ischemia. 2. Mild white matter disease otherwise. This likely reflects the sequela of chronic microvascular ischemia. 3. ASPECTS is 8/10 These results were called by telephone at the time of interpretation on 10/01/2017 at 2:40 pm to Dr. Doug SouSAM JACUBOWITZ , who verbally acknowledged these results. Electronically Signed   By: Marin Robertshristopher  Mattern M.D.   On: 10/01/2017 14:42    EKG: Independently reviewed. Sinus rhythm, diffuse ST-T abnormality. No prior available.   Assessment/Plan   1. Acute ischemic CVA  - Presents with left-sided weakness that developed shortly after midnight on 10/01/17  - Head CT with subtle hypoattenuation in right caudate head and right insular cortex concerning for acute/subacute ischemia  - CTA head and neck with no emergent LVO, mild-mod intracranial atherosclerosis  - He passed swallow eval and was given ASA 325 mg in ED  - Neurology is consulting and much appreciated  - Obtain MRI brain, MRA head, echocardiogram, fasting lipids, A1c  - Continue cardiac monitoring, frequent neuro checks, PT/OT evals, ASA, risk-factor modifications  2. Hypertensive urgency  - BP as high as 200/120 in ED  - Permit HTN in acute-phase ischemic CVA - Use labetalol IVP's prn SBP >220 or DBP >120   DVT prophylaxis: Lovenox Code Status: Full  Family Communication: Son updated at bedside Disposition Plan: Admit to telemetry Consults called: Neurology Admission status:  Inpatient    Briscoe Deutscherimothy S Opyd, MD [facility] Pager [phone]  If 7PM-7AM, please contact night-coverage [email or web address] Password [email or web address]  10/01/2017, 9:34 PM

## 2017-10-01 NOTE — ED Notes (Signed)
Report given to Heather, RN

## 2017-10-01 NOTE — ED Notes (Signed)
[facility]  Attempted to call for report earlier to [address] but unable to give report to accepting nurse, Herbert SetaHeather per Courtney-secretary.  Tried to call for report again.

## 2017-10-01 NOTE — ED Notes (Signed)
ED Provider at bedside. 

## 2017-10-01 NOTE — ED Provider Notes (Signed)
[facility] EMERGENCY DEPARTMENT Provider Note   CSN: [id number] Arrival date & time: 10/01/17  1344     History   Chief Complaint Chief Complaint  Patient presents with  . Weakness    HPI Willie Herrera is a 57 y.o. male.  History is obtained from patient's son who acts as interpreter.  A professional medical interpreter was attempted however son reports that interpretation was not adequate and patient prefers to use his son as interpreter.  Patient fell last night 12 midnight and since the fall he has had weakness in his left arm and left leg and speech is not clear.  No treatment prior to coming here.  No other associated symptoms.  He admits to drinking alcohol last night.  Denies pain anywhere.  He is been unable to walk today without assistance. HPI  Past Medical History:  Diagnosis Date  . HTN (hypertension) 08/25/2014    Patient Active Problem List   Diagnosis Date Noted  . Cough 09/13/2014  . HTN (hypertension) 08/25/2014  . Hyperglycemia 08/25/2014  . Allergic rhinitis 08/25/2014  . Routine general medical examination at a health care facility 05/03/2014  . Insomnia 04/18/2014  . Obesity, unspecified 04/18/2014  . TOBACCO ABUSE 05/29/2010  . SHORTNESS OF BREATH 05/15/2010  . ABDOMINAL DISTENSION 05/15/2010    History reviewed. No pertinent surgical history.     Home Medications    Prior to Admission medications   Not on File    Family History Family History  Problem Relation Age of Onset  . Hypertension Mother     Social History Social History   Tobacco Use  . Smoking status: Current Every Day Smoker  . Smokeless tobacco: Never Used  Substance Use Topics  . Alcohol use: Yes    Comment: weekly  . Drug use: No   No illicit drug use. Allergies   Patient has no known allergies.   Review of Systems Review of Systems  Neurological: Positive for speech difficulty and weakness.  All other systems reviewed and are  negative.    Physical Exam Updated Vital Signs BP (!) 190/114 (BP Location: Left Arm)   Pulse 82   Temp (!) 97.4 F (36.3 C) (Oral)   Resp 16   Ht 5\' 4"  (1.626 m)   Wt 82.6 kg (182 lb)   SpO2 96%   BMI 31.24 kg/m   Physical Exam  Constitutional: He appears well-developed and well-nourished.  No facial asymmetry  HENT:  Head: Normocephalic and atraumatic.  Eyes: Conjunctivae and EOM are normal. Pupils are equal, round, and reactive to light.  Neck: Neck supple. No tracheal deviation present. No thyromegaly present.  Cardiovascular: Normal rate and regular rhythm.  No murmur heard. Pulmonary/Chest: Effort normal and breath sounds normal.  Abdominal: Soft. Bowel sounds are normal. He exhibits no distension. There is no tenderness.  Musculoskeletal: Normal range of motion. He exhibits no edema or tenderness.  Motor strength left upper extremity 4/5, left lower extremity 4/5, right upper extremity 5/5, right lower extremity 5/5.  DTRs symmetric bilaterally at knee jerk ankle jerk and biceps was overgrown bilaterally  Neurological: He is alert. He displays abnormal reflex. No cranial nerve deficit. Coordination normal.  Skin: Skin is warm and dry. Capillary refill takes less than 2 seconds. No rash noted.  Psychiatric: He has a normal mood and affect.  Nursing note and vitals reviewed.    ED Treatments / Results  Labs (all labs ordered are listed, but only abnormal results are displayed) Labs  Reviewed  CBG MONITORING, ED - Abnormal; Notable for the following components:      Result Value   Glucose-Capillary 100 (*)    All other components within normal limits  ETHANOL  PROTIME-INR  APTT  CBC  DIFFERENTIAL  COMPREHENSIVE METABOLIC PANEL  TROPONIN I  RAPID URINE DRUG SCREEN, HOSP PERFORMED  URINALYSIS, ROUTINE W REFLEX MICROSCOPIC  CBG MONITORING, ED    EKG  EKG Interpretation  Date/Time:  Wednesday October 01 2017 14:03:01 EST Ventricular Rate:  80 PR  Interval:    QRS Duration: 110 QT Interval:  380 QTC Calculation: 439 R Axis:   -27 Text Interpretation:  Sinus rhythm Left atrial enlargement RSR' in V1 or V2, right VCD or RVH Inferior infarct, old Minimal ST elevation, anterior leads Lateral leads are also involved Baseline wander in lead(s) V4 V6 No old tracing to compare Confirmed by [place], Doreatha Martin [phone]) on 10/01/2017 2:23:54 PM      Results for orders placed or performed during the hospital encounter of 10/01/17  Ethanol  Result Value Ref Range   Alcohol, Ethyl (B) <10 <10 mg/dL  Protime-INR  Result Value Ref Range   Prothrombin Time 12.4 11.4 - 15.2 seconds   INR 0.93   APTT  Result Value Ref Range   aPTT 30 24 - 36 seconds  CBC  Result Value Ref Range   WBC 9.1 4.0 - 10.5 K/uL   RBC 5.78 4.22 - 5.81 MIL/uL   Hemoglobin 14.9 13.0 - 17.0 g/dL   HCT 19.1 47.8 - 29.5 %   MCV 73.5 (L) 78.0 - 100.0 fL   MCH 25.8 (L) 26.0 - 34.0 pg   MCHC 35.1 30.0 - 36.0 g/dL   RDW 62.1 30.8 - 65.7 %   Platelets 228 150 - 400 K/uL  Differential  Result Value Ref Range   Neutrophils Relative % 58 %   Lymphocytes Relative 29 %   Monocytes Relative 11 %   Eosinophils Relative 2 %   Basophils Relative 0 %   Neutro Abs 5.3 1.7 - 7.7 K/uL   Lymphs Abs 2.6 0.7 - 4.0 K/uL   Monocytes Absolute 1.0 0.1 - 1.0 K/uL   Eosinophils Absolute 0.2 0.0 - 0.7 K/uL   Basophils Absolute 0.0 0.0 - 0.1 K/uL   RBC Morphology TARGET CELLS   Comprehensive metabolic panel  Result Value Ref Range   Sodium 133 (L) 135 - 145 mmol/L   Potassium 3.5 3.5 - 5.1 mmol/L   Chloride 100 (L) 101 - 111 mmol/L   CO2 22 22 - 32 mmol/L   Glucose, Bld 104 (H) 65 - 99 mg/dL   BUN 15 6 - 20 mg/dL   Creatinine, Ser 8.46 0.61 - 1.24 mg/dL   Calcium 8.8 (L) 8.9 - 10.3 mg/dL   Total Protein 8.6 (H) 6.5 - 8.1 g/dL   Albumin 4.2 3.5 - 5.0 g/dL   AST 32 15 - 41 U/L   ALT 29 17 - 63 U/L   Alkaline Phosphatase 71 38 - 126 U/L   Total Bilirubin 0.7 0.3 - 1.2 mg/dL   GFR calc  non Af Amer >60 >60 mL/min   GFR calc Af Amer >60 >60 mL/min   Anion gap 11 5 - 15  Troponin I  Result Value Ref Range   Troponin I <0.03 <0.03 ng/mL  CBG monitoring, ED  Result Value Ref Range   Glucose-Capillary 97 65 - 99 mg/dL  CBG monitoring, ED  Result Value Ref Range   Glucose-Capillary  100 (H) 65 - 99 mg/dL   Comment 1 Notify RN    Comment 2 Document in Chart    Ct Angio Head W Or Wo Contrast  Result Date: 10/01/2017 CLINICAL DATA:  57 year old male with left extremity weakness beginning at 0200 hours today. EXAM: CT ANGIOGRAPHY HEAD AND NECK TECHNIQUE: Multidetector CT imaging of the head and neck was performed using the standard protocol during bolus administration of intravenous contrast. Multiplanar CT image reconstructions and MIPs were obtained to evaluate the vascular anatomy. Carotid stenosis measurements (when applicable) are obtained utilizing NASCET criteria, using the distal internal carotid diameter as the denominator. CONTRAST:  100mL ISOVUE-370 IOPAMIDOL (ISOVUE-370) INJECTION 76% COMPARISON:  Head CT without contrast 1437 hours today. FINDINGS: CTA NECK Skeleton: No acute osseous abnormality identified. Cervical spine disc, endplate, and facet degeneration. Upper chest: Bilateral upper lung atelectasis. No superior mediastinal lymphadenopathy. Other neck: Negative; partially retropharyngeal course of both carotids-more so the left. No neck mass or lymphadenopathy. Aortic arch: 3 vessel arch configuration with mild arch atherosclerosis distal to the left subclavian origin. No great vessel origin atherosclerosis or stenosis. Right carotid system: Negative aside from mild tortuosity. Widely patent right carotid bifurcation. Left carotid system: Negative aside from tortuosity. Widely patent left carotid bifurcation. Vertebral arteries: Normal proximal right subclavian artery. The right vertebral artery is non dominant and diminutive but patent from its origin to the skull base  without stenosis identified. Normal proximal left subclavian artery. The left vertebral artery origin is normal (series 8, image 171). The left vertebral is dominant. The left V1 segment is tortuous with a kinked appearance, but there is otherwise no cervical left vertebral artery stenosis. CTA HEAD Posterior circulation: Dominant distal left vertebral artery primarily supplies the basilar. The distal right vertebral artery functionally terminates in PICA. Normal left PICA origin. Patent basilar artery without stenosis. Normal SCA origins (duplicated on the right). There are fetal type bilateral PCA origins. Bilateral PCA branches are within normal limits. Anterior circulation: Both ICA siphons are patent. There is mild to moderate bilateral ICA siphon calcified plaque. No hemodynamically significant stenosis occurs on the left. Mild if any right ICA siphon stenosis in the cavernous and supraclinoid segments. The bilateral ophthalmic and posterior communicating artery origins are normal. Patent carotid termini. Normal MCA and ACA origins. Anterior communicating artery is diminutive or absent. Bilateral ACA branches are within normal limits. Left MCA M1 segment and bifurcation are patent. There is mild irregularity and stenosis at the dominant posterior left M2 origin best seen on series 11, image 19. Left MCA branches otherwise are within normal limits. The right MCA M1 segment and bifurcation are patent and appear normal. Right MCA branches are within normal limits. Venous sinuses: Patent. Anatomic variants: Dominant left vertebral artery, the right terminates in PICA. Fetal type bilateral PCA origins. Delayed phase: Age indeterminate hypodensity re-demonstrated at the head of the caudate nucleus (series 13, image 15). Hypodensity also along the anterior limb of the left external capsule appears stable. No cortically based acute infarct identified. No abnormal enhancement identified. Review of the MIP images  confirms the above findings IMPRESSION: 1. Negative for emergent large vessel occlusion. No hemodynamically significant arterial stenosis in the head or neck. 2. No significant extracranial atherosclerosis. Mild to moderate intracranial atherosclerosis most apparent at the ICA siphons and left MCA bifurcation. 3. Stable CT appearance of the brain from 1437 hours today. Age indeterminate small vessel ischemia in the right basal ganglia and left deep white matter capsules. Electronically Signed  By: Odessa Fleming M.D.   On: 10/01/2017 16:09   Dg Chest 2 View  Result Date: 10/01/2017 CLINICAL DATA:  57 year old male with left side weakness beginning at 0200 hours. EXAM: CHEST  2 VIEW COMPARISON:  Chest radiographs 05/12/2015. FINDINGS: AP and lateral views of the chest. Lung volumes are stable and within normal limits. Mediastinal contours remain normal. Visualized tracheal air column is within normal limits. No pneumothorax, pleural effusion or confluent pulmonary opacity. There is bilateral pulmonary vascular congestion, but no overt edema. No acute osseous abnormality identified. Negative visible bowel gas pattern. IMPRESSION: Mild pulmonary vascular congestion without overt edema. No other acute findings. Electronically Signed   By: Odessa Fleming M.D.   On: 10/01/2017 15:16   Ct Angio Neck W And/or Wo Contrast  Result Date: 10/01/2017 CLINICAL DATA:  57 year old male with left extremity weakness beginning at 0200 hours today. EXAM: CT ANGIOGRAPHY HEAD AND NECK TECHNIQUE: Multidetector CT imaging of the head and neck was performed using the standard protocol during bolus administration of intravenous contrast. Multiplanar CT image reconstructions and MIPs were obtained to evaluate the vascular anatomy. Carotid stenosis measurements (when applicable) are obtained utilizing NASCET criteria, using the distal internal carotid diameter as the denominator. CONTRAST:  ISOVUE-370 IOPAMIDOL (ISOVUE-370) INJECTION 76%  COMPARISON:  Head CT without contrast 1437 hours today. FINDINGS: CTA NECK Skeleton: No acute osseous abnormality identified. Cervical spine disc, endplate, and facet degeneration. Upper chest: Bilateral upper lung atelectasis. No superior mediastinal lymphadenopathy. Other neck: Negative; partially retropharyngeal course of both carotids-more so the left. No neck mass or lymphadenopathy. Aortic arch: 3 vessel arch configuration with mild arch atherosclerosis distal to the left subclavian origin. No great vessel origin atherosclerosis or stenosis. Right carotid system: Negative aside from mild tortuosity. Widely patent right carotid bifurcation. Left carotid system: Negative aside from tortuosity. Widely patent left carotid bifurcation. Vertebral arteries: Normal proximal right subclavian artery. The right vertebral artery is non dominant and diminutive but patent from its origin to the skull base without stenosis identified. Normal proximal left subclavian artery. The left vertebral artery origin is normal (series 8, image 171). The left vertebral is dominant. The left V1 segment is tortuous with a kinked appearance, but there is otherwise no cervical left vertebral artery stenosis. CTA HEAD Posterior circulation: Dominant distal left vertebral artery primarily supplies the basilar. The distal right vertebral artery functionally terminates in PICA. Normal left PICA origin. Patent basilar artery without stenosis. Normal SCA origins (duplicated on the right). There are fetal type bilateral PCA origins. Bilateral PCA branches are within normal limits. Anterior circulation: Both ICA siphons are patent. There is mild to moderate bilateral ICA siphon calcified plaque. No hemodynamically significant stenosis occurs on the left. Mild if any right ICA siphon stenosis in the cavernous and supraclinoid segments. The bilateral ophthalmic and posterior communicating artery origins are normal. Patent carotid termini. Normal MCA  and ACA origins. Anterior communicating artery is diminutive or absent. Bilateral ACA branches are within normal limits. Left MCA M1 segment and bifurcation are patent. There is mild irregularity and stenosis at the dominant posterior left M2 origin best seen on series 11, image 19. Left MCA branches otherwise are within normal limits. The right MCA M1 segment and bifurcation are patent and appear normal. Right MCA branches are within normal limits. Venous sinuses: Patent. Anatomic variants: Dominant left vertebral artery, the right terminates in PICA. Fetal type bilateral PCA origins. Delayed phase: Age indeterminate hypodensity re-demonstrated at the head of the caudate nucleus (series  13, image 15). Hypodensity also along the anterior limb of the left external capsule appears stable. No cortically based acute infarct identified. No abnormal enhancement identified. Review of the MIP images confirms the above findings IMPRESSION: 1. Negative for emergent large vessel occlusion. No hemodynamically significant arterial stenosis in the head or neck. 2. No significant extracranial atherosclerosis. Mild to moderate intracranial atherosclerosis most apparent at the ICA siphons and left MCA bifurcation. 3. Stable CT appearance of the brain from 1437 hours today. Age indeterminate small vessel ischemia in the right basal ganglia and left deep white matter capsules. Electronically Signed   By: Odessa Fleming M.D.   On: 10/01/2017 16:09   Ct Head Code Stroke Wo Contrast  Result Date: 10/01/2017 CLINICAL DATA:  Code stroke. Acute onset of left upper and lower extremity weakness beginning at 2 a.m. today, 12 hours ago. EXAM: CT HEAD WITHOUT CONTRAST TECHNIQUE: Contiguous axial images were obtained from the base of the skull through the vertex without intravenous contrast. COMPARISON:  None. FINDINGS: Brain: Subtle hypoattenuation is present in the right caudate head and anterior right insular cortex. Asymmetric white matter  hypoattenuation is present in the left external capsule. Other scattered subcortical white matter hypoattenuation is present. No other focal cortical abnormality is present. No acute hemorrhage or mass lesion is present. The ventricles are of normal size. No significant extra-axial fluid collection is present. Vascular: Atherosclerotic calcifications are present within the cavernous internal carotid arteries bilaterally. There is no hyperdense vessel. : Calvarium is intact. No focal lytic or blastic lesions are present. Sinuses/Orbits: The paranasal sinuses and mastoid air cells are clear. Visualized globes and orbits are within normal limits. Other: ASPECTS ([place]ber[place] Stroke Program Early CT Score) - Ganglionic level infarction (caudate, lentiform nuclei, internal capsule, insula, M1-M3 cortex): 5/7 - Supraganglionic infarction (M4-M6 cortex): 3/3 Total score (0-10 with 10 being normal): 10/10 IMPRESSION: 1. Subtle hypoattenuation involving the right caudate head and anterior right insular cortex is concerning for acute/subacute ischemia. 2. Mild white matter disease otherwise. This likely reflects the sequela of chronic microvascular ischemia. 3. ASPECTS is 8/10 These results were called by telephone at the time of interpretation on 10/01/2017 at 2:40 pm to Dr. Doug Sou , who verbally acknowledged these results. Electronically Signed   By: Marin Roberts M.D.   On: 10/01/2017 14:42   Radiology No results found.  Procedures Procedures (including critical care time)  Medications Ordered in ED Medications - No data to display   Initial Impression / Assessment and Plan / ED Course  I have reviewed the triage vital signs and the nursing notes.  Pertinent labs & imaging results that were available during my care of the patient were reviewed by me and considered in my medical decision making (see chart for details).     Patient passed swallow screen.  CT scan discussed with Dr.Aroor.  He  requests CT angiogram of head and neck.  If no large vessel occlusion patient can be admitted to hospital service at [facility].  Patient passed swallow screen.  Aspirin ordered.   4:30 PM exam unchanged.pt signed out to Dr Jacqulyn Bath 4pm  Final Clinical Impressions(s) / ED Diagnoses  Dx #1subacute stroke Final diagnoses:  None  #2 hypertension  ED Discharge Orders    None       Doug Sou, MD 10/01/17 [phone]

## 2017-10-01 NOTE — ED Triage Notes (Signed)
Per son/interpreter-pt with left side weakness arm and leg started 2am-pt with facial asymmetry-presents to triage in w/c

## 2017-10-01 NOTE — Consult Note (Signed)
Referring Physician: Dr. Myna Hidalgo    Chief Complaint: Left sided weakness  HPI: Willie Herrera is an 57 y.o. male who presented to the [facility] ED this afternoon with new onset of LUE/LLE weakness, dysarthria and facial droop. The patient is from [place] and family provides language interpretation during the interview. The patient states he first noticed symptoms at sometime between midnight and 2 AM today, after getting out of bed for a smoke. When attempting to ambulate, his left side gave way and he fell. Since then, he was unable to walk without assistance. He recently had a URI per son. He denies confusion, difficulty understanding speech, difficulty expressing himself. No vision changes. No headache. Has has a stiff sensation to the back of his neck.     He has a diagnosis of HTN. He has never had a stroke. He does not take ASA at home.   CT head:  1. Subtle hypoattenuation involving the right caudate head and anterior right insular cortex is concerning for acute/subacute ischemia. 2. Mild white matter disease otherwise. This likely reflects the sequela of chronic microvascular ischemia. 3. ASPECTS is 8/10  CTA head and neck:  1. Negative for emergent large vessel occlusion. No hemodynamically significant arterial stenosis in the head or neck. 2. No significant extracranial atherosclerosis. Mild to moderate intracranial atherosclerosis most apparent at the ICA siphons and left MCA bifurcation. 3. Stable CT appearance of the brain from 1437 hours today. Age indeterminate small vessel ischemia in the right basal ganglia and left deep white matter capsules.  Past Medical History:  Diagnosis Date  . HTN (hypertension) 08/25/2014    History reviewed. No pertinent surgical history.  Family History  Problem Relation Age of Onset  . Hypertension Mother    Social History:  reports that he has been smoking.  he has never used smokeless tobacco. He reports that he drinks alcohol. He reports that he  does not use drugs.  Allergies: No Known Allergies  Medications:  Prior to Admission:  No medications prior to admission.   Scheduled: .  stroke: mapping our early stages of recovery book   Does not apply Once  . [START ON 10/02/2017] aspirin  300 mg Rectal Daily   Or  . [START ON 10/02/2017] aspirin  325 mg Oral Daily  . enoxaparin (LOVENOX) injection  40 mg Subcutaneous Q24H   Continuous: . sodium chloride     JJK:[id number] **OR** acetaminophen (TYLENOL) oral liquid 160 mg/5 mL **OR** acetaminophen, labetalol, senna-docusate  ROS: As per HPI  Physical Examination: Blood pressure (!) 190/119, pulse 82, temperature 98.1 F (36.7 C), temperature source Oral, resp. rate 18, height _0  (1.626 m), weight 82.6 kg (182 lb), SpO2 98 %.  HEENT: [place]/AT Lungs: Respirations unlabored Ext: No edema  Neurologic Examination: Mental Status:  Alert, oriented, thought content appropriate.  Speech fluent without evidence of aphasia.  Able to follow all commands without difficulty. Mild dysarthria Cranial Nerves: II:  Visual fields intact bilaterally; no extinction to DSS. PERRL.  III,IV, VI: Ptosis not present, EOMI, but with some hesitancy in gazing to the left. Without nystagmus V,VII: Left facial droop. Temp sensation equal bilaterally VIII: hearing intact to voice IX,X: Palate rises symmetrically XI: Mild weakness on the left XII: midline tongue extension  Motor: RUE 4+/5 except weaker at deltoid 4/5 LUE 5/5 RLE 4/5 LLE 5/5 Sensory: Temp and light touch intact x 4. Extinction on the left. Deep Tendon Reflexes:  Upper extremities 2+ bilaterally Right patella 2+, left patella 4+ Achilles  2+ bilaterally Plantars: Right: downgoing  Left: downgoing Cerebellar: No ataxia with FNF on right. Ataxia is present with left FNF.  Gait: Deferred  Results for orders placed or performed during the hospital encounter of 10/01/17 (from the past 48 hour(s))  CBG monitoring, ED     Status:  None   Collection Time: 10/01/17  1:54 PM  Result Value Ref Range   Glucose-Capillary 97 65 - 99 mg/dL  Ethanol     Status: None   Collection Time: 10/01/17  2:15 PM  Result Value Ref Range   Alcohol, Ethyl (B) <10 <10 mg/dL    Comment:        LOWEST DETECTABLE LIMIT FOR SERUM ALCOHOL IS 10 mg/dL FOR MEDICAL PURPOSES ONLY Performed at [facility], [address] [place], [place] [address]   Protime-INR     Status: None   Collection Time: 10/01/17  2:15 PM  Result Value Ref Range   Prothrombin Time 12.4 11.4 - 15.2 seconds   INR 0.93     Comment: Performed at [facility], [address] [place], [place] [address]  APTT     Status: None   Collection Time: 10/01/17  2:15 PM  Result Value Ref Range   aPTT 30 24 - 36 seconds    Comment: Performed at [facility], [address] [place], [place] [address]  CBC     Status: Abnormal   Collection Time: 10/01/17  2:15 PM  Result Value Ref Range   WBC 9.1 4.0 - 10.5 K/uL   RBC 5.78 4.22 - 5.81 MIL/uL   Hemoglobin 14.9 13.0 - 17.0 g/dL   HCT 42.5 39.0 - 52.0 %   MCV 73.5 (L) 78.0 - 100.0 fL   MCH 25.8 (L) 26.0 - 34.0 pg   MCHC 35.1 30.0 - 36.0 g/dL   RDW 14.9 11.5 - 15.5 %   Platelets 228 150 - 400 K/uL    Comment: Performed at [facility], [address] [place], [place] [address]  Differential     Status: None   Collection Time: 10/01/17  2:15 PM  Result Value Ref Range   Neutrophils Relative % 58 %   Lymphocytes Relative 29 %   Monocytes Relative 11 %   Eosinophils Relative 2 %   Basophils Relative 0 %   Neutro Abs 5.3 1.7 - 7.7 K/uL   Lymphs Abs 2.6 0.7 - 4.0 K/uL   Monocytes Absolute 1.0 0.1 - 1.0 K/uL   Eosinophils Absolute 0.2 0.0 - 0.7 K/uL   Basophils Absolute 0.0 0.0 - 0.1 K/uL   RBC Morphology TARGET CELLS     Comment: Performed at [facility], [address] [place], [place] [address]  Comprehensive metabolic panel     Status: Abnormal   Collection  Time: 10/01/17  2:15 PM  Result Value Ref Range   Sodium 133 (L) 135 - 145 mmol/L   Potassium 3.5 3.5 - 5.1 mmol/L   Chloride 100 (L) 101 - 111 mmol/L   CO2 22 22 - 32 mmol/L   Glucose, Bld 104 (H) 65 - 99 mg/dL   BUN 15 6 - 20 mg/dL   Creatinine, Ser 1.04 0.61 - 1.24 mg/dL   Calcium 8.8 (L) 8.9 - 10.3 mg/dL   Total Protein 8.6 (H) 6.5 - 8.1 g/dL   Albumin 4.2 3.5 - 5.0 g/dL   AST 32 15 - 41 U/L   ALT  29 17 - 63 U/L   Alkaline Phosphatase 71 38 - 126 U/L   Total Bilirubin 0.7 0.3 - 1.2 mg/dL   GFR calc non Af Amer >60 >60 mL/min   GFR calc Af Amer >60 >60 mL/min    Comment: (NOTE) The eGFR has been calculated using the CKD EPI equation. This calculation has not been validated in all clinical situations. eGFR's persistently <60 mL/min signify possible Chronic Kidney Disease.    Anion gap 11 5 - 15    Comment: Performed at [facility], [address] [place], [place] [address]  Troponin I     Status: None   Collection Time: 10/01/17  2:15 PM  Result Value Ref Range   Troponin I <0.03 <0.03 ng/mL    Comment: Performed at [facility], [address] [place], [place] [address]  CBG monitoring, ED     Status: Abnormal   Collection Time: 10/01/17  2:16 PM  Result Value Ref Range   Glucose-Capillary 100 (H) 65 - 99 mg/dL   Comment 1 Notify RN    Comment 2 Document in Chart   Urine rapid drug screen (hosp performed)     Status: None   Collection Time: 10/01/17  4:00 PM  Result Value Ref Range   Opiates NONE DETECTED NONE DETECTED   Cocaine NONE DETECTED NONE DETECTED   Benzodiazepines NONE DETECTED NONE DETECTED   Amphetamines NONE DETECTED NONE DETECTED   Tetrahydrocannabinol NONE DETECTED NONE DETECTED   Barbiturates NONE DETECTED NONE DETECTED    Comment: (NOTE) DRUG SCREEN FOR MEDICAL PURPOSES ONLY.  IF CONFIRMATION IS NEEDED FOR ANY PURPOSE, NOTIFY LAB WITHIN 5 DAYS. LOWEST DETECTABLE LIMITS FOR URINE DRUG SCREEN Drug Class                      Cutoff (ng/mL) Amphetamine and metabolites    1000 Barbiturate and metabolites    200 Benzodiazepine                 283 Tricyclics and metabolites     300 Opiates and metabolites        300 Cocaine and metabolites        300 THC                            50 Performed at [facility], [address] [place], [place] [address]   Urinalysis, Routine w reflex microscopic     Status: Abnormal   Collection Time: 10/01/17  4:00 PM  Result Value Ref Range   Color, Urine YELLOW YELLOW   APPearance CLEAR CLEAR   Specific Gravity, Urine 1.010 1.005 - 1.030   pH 7.0 5.0 - 8.0   Glucose, UA NEGATIVE NEGATIVE mg/dL   Hgb urine dipstick NEGATIVE NEGATIVE   Bilirubin Urine NEGATIVE NEGATIVE   Ketones, ur NEGATIVE NEGATIVE mg/dL   Protein, ur 30 (A) NEGATIVE mg/dL   Nitrite NEGATIVE NEGATIVE   Leukocytes, UA NEGATIVE NEGATIVE    Comment: Performed at [facility], [address] [place], [place] [address]  Urinalysis, Microscopic (reflex)     Status: Abnormal   Collection Time: 10/01/17  4:00 PM  Result Value Ref Range   RBC / HPF NONE SEEN 0 - 5 RBC/hpf   WBC, UA NONE SEEN 0 - 5 WBC/hpf   Bacteria, UA RARE (A) NONE SEEN   Squamous Epithelial /  LPF 0-5 (A) NONE SEEN    Comment: Performed at [facility], [address] [place], [place] [address]   Ct Angio Head W Or Wo Contrast  Result Date: 10/01/2017 CLINICAL DATA:  57 year old male with left extremity weakness beginning at 0200 hours today. EXAM: CT ANGIOGRAPHY HEAD AND NECK TECHNIQUE: Multidetector CT imaging of the head and neck was performed using the standard protocol during bolus administration of intravenous contrast. Multiplanar CT image reconstructions and MIPs were obtained to evaluate the vascular anatomy. Carotid stenosis measurements (when applicable) are obtained utilizing NASCET criteria, using the distal internal carotid diameter as the denominator. CONTRAST:  147m ISOVUE-370 IOPAMIDOL  (ISOVUE-370) INJECTION 76% COMPARISON:  Head CT without contrast 1437 hours today. FINDINGS: CTA NECK Skeleton: No acute osseous abnormality identified. Cervical spine disc, endplate, and facet degeneration. Upper chest: Bilateral upper lung atelectasis. No superior mediastinal lymphadenopathy. Other neck: Negative; partially retropharyngeal course of both carotids-more so the left. No neck mass or lymphadenopathy. Aortic arch: 3 vessel arch configuration with mild arch atherosclerosis distal to the left subclavian origin. No great vessel origin atherosclerosis or stenosis. Right carotid system: Negative aside from mild tortuosity. Widely patent right carotid bifurcation. Left carotid system: Negative aside from tortuosity. Widely patent left carotid bifurcation. Vertebral arteries: Normal proximal right subclavian artery. The right vertebral artery is non dominant and diminutive but patent from its origin to the skull base without stenosis identified. Normal proximal left subclavian artery. The left vertebral artery origin is normal (series 8, image 171). The left vertebral is dominant. The left V1 segment is tortuous with a kinked appearance, but there is otherwise no cervical left vertebral artery stenosis. CTA HEAD Posterior circulation: Dominant distal left vertebral artery primarily supplies the basilar. The distal right vertebral artery functionally terminates in PICA. Normal left PICA origin. Patent basilar artery without stenosis. Normal SCA origins (duplicated on the right). There are fetal type bilateral PCA origins. Bilateral PCA branches are within normal limits. Anterior circulation: Both ICA siphons are patent. There is mild to moderate bilateral ICA siphon calcified plaque. No hemodynamically significant stenosis occurs on the left. Mild if any right ICA siphon stenosis in the cavernous and supraclinoid segments. The bilateral ophthalmic and posterior communicating artery origins are normal. Patent  carotid termini. Normal MCA and ACA origins. Anterior communicating artery is diminutive or absent. Bilateral ACA branches are within normal limits. Left MCA M1 segment and bifurcation are patent. There is mild irregularity and stenosis at the dominant posterior left M2 origin best seen on series 11, image 19. Left MCA branches otherwise are within normal limits. The right MCA M1 segment and bifurcation are patent and appear normal. Right MCA branches are within normal limits. Venous sinuses: Patent. Anatomic variants: Dominant left vertebral artery, the right terminates in PICA. Fetal type bilateral PCA origins. Delayed phase: Age indeterminate hypodensity re-demonstrated at the head of the caudate nucleus (series 13, image 15). Hypodensity also along the anterior limb of the left external capsule appears stable. No cortically based acute infarct identified. No abnormal enhancement identified. Review of the MIP images confirms the above findings IMPRESSION: 1. Negative for emergent large vessel occlusion. No hemodynamically significant arterial stenosis in the head or neck. 2. No significant extracranial atherosclerosis. Mild to moderate intracranial atherosclerosis most apparent at the ICA siphons and left MCA bifurcation. 3. Stable CT appearance of the brain from 1437 hours today. Age indeterminate small vessel ischemia in the right basal ganglia and left deep white matter capsules. Electronically Signed  By: Genevie Ann M.D.   On: 10/01/2017 16:09   Dg Chest 2 View  Result Date: 10/01/2017 CLINICAL DATA:  57 year old male with left side weakness beginning at 0200 hours. EXAM: CHEST  2 VIEW COMPARISON:  Chest radiographs 05/12/2015. FINDINGS: AP and lateral views of the chest. Lung volumes are stable and within normal limits. Mediastinal contours remain normal. Visualized tracheal air column is within normal limits. No pneumothorax, pleural effusion or confluent pulmonary opacity. There is bilateral pulmonary  vascular congestion, but no overt edema. No acute osseous abnormality identified. Negative visible bowel gas pattern. IMPRESSION: Mild pulmonary vascular congestion without overt edema. No other acute findings. Electronically Signed   By: Genevie Ann M.D.   On: 10/01/2017 15:16   Ct Angio Neck W And/or Wo Contrast  Result Date: 10/01/2017 CLINICAL DATA:  57 year old male with left extremity weakness beginning at 0200 hours today. EXAM: CT ANGIOGRAPHY HEAD AND NECK TECHNIQUE: Multidetector CT imaging of the head and neck was performed using the standard protocol during bolus administration of intravenous contrast. Multiplanar CT image reconstructions and MIPs were obtained to evaluate the vascular anatomy. Carotid stenosis measurements (when applicable) are obtained utilizing NASCET criteria, using the distal internal carotid diameter as the denominator. CONTRAST:  164m ISOVUE-370 IOPAMIDOL (ISOVUE-370) INJECTION 76% COMPARISON:  Head CT without contrast 1437 hours today. FINDINGS: CTA NECK Skeleton: No acute osseous abnormality identified. Cervical spine disc, endplate, and facet degeneration. Upper chest: Bilateral upper lung atelectasis. No superior mediastinal lymphadenopathy. Other neck: Negative; partially retropharyngeal course of both carotids-more so the left. No neck mass or lymphadenopathy. Aortic arch: 3 vessel arch configuration with mild arch atherosclerosis distal to the left subclavian origin. No great vessel origin atherosclerosis or stenosis. Right carotid system: Negative aside from mild tortuosity. Widely patent right carotid bifurcation. Left carotid system: Negative aside from tortuosity. Widely patent left carotid bifurcation. Vertebral arteries: Normal proximal right subclavian artery. The right vertebral artery is non dominant and diminutive but patent from its origin to the skull base without stenosis identified. Normal proximal left subclavian artery. The left vertebral artery origin is  normal (series 8, image 171). The left vertebral is dominant. The left V1 segment is tortuous with a kinked appearance, but there is otherwise no cervical left vertebral artery stenosis. CTA HEAD Posterior circulation: Dominant distal left vertebral artery primarily supplies the basilar. The distal right vertebral artery functionally terminates in PICA. Normal left PICA origin. Patent basilar artery without stenosis. Normal SCA origins (duplicated on the right). There are fetal type bilateral PCA origins. Bilateral PCA branches are within normal limits. Anterior circulation: Both ICA siphons are patent. There is mild to moderate bilateral ICA siphon calcified plaque. No hemodynamically significant stenosis occurs on the left. Mild if any right ICA siphon stenosis in the cavernous and supraclinoid segments. The bilateral ophthalmic and posterior communicating artery origins are normal. Patent carotid termini. Normal MCA and ACA origins. Anterior communicating artery is diminutive or absent. Bilateral ACA branches are within normal limits. Left MCA M1 segment and bifurcation are patent. There is mild irregularity and stenosis at the dominant posterior left M2 origin best seen on series 11, image 19. Left MCA branches otherwise are within normal limits. The right MCA M1 segment and bifurcation are patent and appear normal. Right MCA branches are within normal limits. Venous sinuses: Patent. Anatomic variants: Dominant left vertebral artery, the right terminates in PICA. Fetal type bilateral PCA origins. Delayed phase: Age indeterminate hypodensity re-demonstrated at the head of the caudate nucleus (series  13, image 15). Hypodensity also along the anterior limb of the left external capsule appears stable. No cortically based acute infarct identified. No abnormal enhancement identified. Review of the MIP images confirms the above findings IMPRESSION: 1. Negative for emergent large vessel occlusion. No hemodynamically  significant arterial stenosis in the head or neck. 2. No significant extracranial atherosclerosis. Mild to moderate intracranial atherosclerosis most apparent at the ICA siphons and left MCA bifurcation. 3. Stable CT appearance of the brain from 1437 hours today. Age indeterminate small vessel ischemia in the right basal ganglia and left deep white matter capsules. Electronically Signed   By: Genevie Ann M.D.   On: 10/01/2017 16:09   Ct Head Code Stroke Wo Contrast  Result Date: 10/01/2017 CLINICAL DATA:  Code stroke. Acute onset of left upper and lower extremity weakness beginning at 2 a.m. today, 12 hours ago. EXAM: CT HEAD WITHOUT CONTRAST TECHNIQUE: Contiguous axial images were obtained from the base of the skull through the vertex without intravenous contrast. COMPARISON:  None. FINDINGS: Brain: Subtle hypoattenuation is present in the right caudate head and anterior right insular cortex. Asymmetric white matter hypoattenuation is present in the left external capsule. Other scattered subcortical white matter hypoattenuation is present. No other focal cortical abnormality is present. No acute hemorrhage or mass lesion is present. The ventricles are of normal size. No significant extra-axial fluid collection is present. Vascular: Atherosclerotic calcifications are present within the cavernous internal carotid arteries bilaterally. There is no hyperdense vessel. : Calvarium is intact. No focal lytic or blastic lesions are present. Sinuses/Orbits: The paranasal sinuses and mastoid air cells are clear. Visualized globes and orbits are within normal limits. Other: ASPECTS ([place] Stroke Program Early CT Score) - Ganglionic level infarction (caudate, lentiform nuclei, internal capsule, insula, M1-M3 cortex): 5/7 - Supraganglionic infarction (M4-M6 cortex): 3/3 Total score (0-10 with 10 being normal): 10/10 IMPRESSION: 1. Subtle hypoattenuation involving the right caudate head and anterior right insular cortex is  concerning for acute/subacute ischemia. 2. Mild white matter disease otherwise. This likely reflects the sequela of chronic microvascular ischemia. 3. ASPECTS is 8/10 These results were called by telephone at the time of interpretation on 10/01/2017 at 2:40 pm to Dr. Orlie Dakin , who verbally acknowledged these results. Electronically Signed   By: San Morelle M.D.   On: 10/01/2017 14:42    Assessment: 57 y.o. male with new onset of left sided weakness with dysarthria and facial droop.  1. CT head at [facility] revealed subtle hypoattenuation involving the right caudate head and anterior right insular cortex is concerning for acute/subacute ischemia 2. CTA without LVO 3. Stroke Risk Factors - HTN  Plan: 1. HgbA1c, fasting lipid panel 2. MRI of the brain without contrast 3. PT consult, OT consult, Speech consult 4. Echocardiogram 5. Prophylactic therapy- Continue ASA.  6. Start atorvastatin. Obtain baseline CK level.  7. Risk factor modification 8. Telemetry monitoring 9. Frequent neuro checks 10. Permissive HTN until 2 AM, then gradually reduce BP with antihypertensives by about 20% per day  _0  signed: Dr. Kerney Elbe  10/01/2017, 9:15 PM

## 2017-10-02 ENCOUNTER — Encounter (HOSPITAL_COMMUNITY): Payer: Self-pay | Admitting: Neurology

## 2017-10-02 ENCOUNTER — Encounter (HOSPITAL_COMMUNITY): Payer: Self-pay

## 2017-10-02 ENCOUNTER — Inpatient Hospital Stay (HOSPITAL_COMMUNITY): Payer: Self-pay

## 2017-10-02 ENCOUNTER — Inpatient Hospital Stay (HOSPITAL_COMMUNITY)
Admission: RE | Admit: 2017-10-02 | Discharge: 2017-10-15 | DRG: 057 | Disposition: A | Discharge status: Home Health | Payer: Self-pay | Source: Intra-hospital | Attending: Physical Medicine & Rehabilitation | Admitting: Physical Medicine & Rehabilitation

## 2017-10-02 ENCOUNTER — Other Ambulatory Visit: Payer: Self-pay

## 2017-10-02 DIAGNOSIS — N183 Chronic kidney disease, stage 3 unspecified: Secondary | ICD-10-CM

## 2017-10-02 DIAGNOSIS — I129 Hypertensive chronic kidney disease with stage 1 through stage 4 chronic kidney disease, or unspecified chronic kidney disease: Secondary | ICD-10-CM | POA: Diagnosis present

## 2017-10-02 DIAGNOSIS — I160 Hypertensive urgency: Secondary | ICD-10-CM

## 2017-10-02 DIAGNOSIS — Z720 Tobacco use: Secondary | ICD-10-CM

## 2017-10-02 DIAGNOSIS — G8114 Spastic hemiplegia affecting left nondominant side: Secondary | ICD-10-CM

## 2017-10-02 DIAGNOSIS — R7303 Prediabetes: Secondary | ICD-10-CM

## 2017-10-02 DIAGNOSIS — G4700 Insomnia, unspecified: Secondary | ICD-10-CM | POA: Diagnosis present

## 2017-10-02 DIAGNOSIS — Z823 Family history of stroke: Secondary | ICD-10-CM

## 2017-10-02 DIAGNOSIS — E785 Hyperlipidemia, unspecified: Secondary | ICD-10-CM

## 2017-10-02 DIAGNOSIS — I69398 Other sequelae of cerebral infarction: Secondary | ICD-10-CM

## 2017-10-02 DIAGNOSIS — F1010 Alcohol abuse, uncomplicated: Secondary | ICD-10-CM

## 2017-10-02 DIAGNOSIS — R51 Headache: Secondary | ICD-10-CM | POA: Diagnosis present

## 2017-10-02 DIAGNOSIS — R05 Cough: Secondary | ICD-10-CM | POA: Diagnosis present

## 2017-10-02 DIAGNOSIS — F17210 Nicotine dependence, cigarettes, uncomplicated: Secondary | ICD-10-CM | POA: Diagnosis present

## 2017-10-02 DIAGNOSIS — Z23 Encounter for immunization: Secondary | ICD-10-CM

## 2017-10-02 DIAGNOSIS — I639 Cerebral infarction, unspecified: Secondary | ICD-10-CM | POA: Diagnosis present

## 2017-10-02 DIAGNOSIS — Z8249 Family history of ischemic heart disease and other diseases of the circulatory system: Secondary | ICD-10-CM

## 2017-10-02 DIAGNOSIS — I10 Essential (primary) hypertension: Secondary | ICD-10-CM

## 2017-10-02 DIAGNOSIS — M25512 Pain in left shoulder: Secondary | ICD-10-CM | POA: Diagnosis present

## 2017-10-02 DIAGNOSIS — I63311 Cerebral infarction due to thrombosis of right middle cerebral artery: Secondary | ICD-10-CM

## 2017-10-02 DIAGNOSIS — N179 Acute kidney failure, unspecified: Secondary | ICD-10-CM

## 2017-10-02 DIAGNOSIS — I69354 Hemiplegia and hemiparesis following cerebral infarction affecting left non-dominant side: Principal | ICD-10-CM

## 2017-10-02 DIAGNOSIS — R269 Unspecified abnormalities of gait and mobility: Secondary | ICD-10-CM

## 2017-10-02 HISTORY — DX: Acute kidney failure, unspecified: N17.9

## 2017-10-02 LAB — ECHOCARDIOGRAM COMPLETE
CHL CUP MV DEC (S): 165
E decel time: 165 msec
EERAT: 16.95
FS: 30 % (ref 28–44)
Height: 64 in
IVS/LV PW RATIO, ED: 1.14
LA ID, A-P, ES: 34 mm
LA diam end sys: 34 mm
LA vol index: 20.7 mL/m2
LADIAMINDEX: 1.81 cm/m2
LAVOL: 38.9 mL
LAVOLA4C: 40.8 mL
LV e' LATERAL: 6.96 cm/s
LVEEAVG: 16.95
LVEEMED: 16.95
LVOT VTI: 14.2 cm
LVOT area: 2.54 cm2
LVOT diameter: 18 mm
LVOT peak vel: 87.5 cm/s
LVOTSV: 36 mL
MV Peak grad: 6 mmHg
MVPKAVEL: 38.3 m/s
MVPKEVEL: 118 m/s
PW: 13.2 mm — AB (ref 0.6–1.1)
RV LATERAL S' VELOCITY: 8.27 cm/s
TAPSE: 22.8 mm
TDI e' lateral: 6.96
TDI e' medial: 6.74
Weight: 2912 oz

## 2017-10-02 LAB — LIPID PANEL
CHOLESTEROL: 232 mg/dL — AB (ref 0–200)
HDL: 69 mg/dL (ref >40)
LDL Cholesterol: 142 mg/dL — ABNORMAL HIGH (ref 0–99)
Total CHOL/HDL Ratio: 3.4 RATIO
Triglycerides: 105 mg/dL (ref <150)
VLDL: 21 mg/dL (ref 0–40)

## 2017-10-02 LAB — HEMOGLOBIN A1C
HEMOGLOBIN A1C: 6.2 % — AB (ref 4.8–5.6)
MEAN PLASMA GLUCOSE: 131.24 mg/dL

## 2017-10-02 LAB — CBC
HEMATOCRIT: 43 % (ref 39.0–52.0)
HEMOGLOBIN: 14.7 g/dL (ref 13.0–17.0)
MCH: 26.3 pg (ref 26.0–34.0)
MCHC: 34.2 g/dL (ref 30.0–36.0)
MCV: 76.8 fL — AB (ref 78.0–100.0)
PLATELETS: 211 10*3/uL (ref 150–400)
RBC: 5.6 MIL/uL (ref 4.22–5.81)
RDW: 14.6 % (ref 11.5–15.5)
WBC: 8.8 10*3/uL (ref 4.0–10.5)

## 2017-10-02 LAB — CREATININE, SERUM: CREATININE: 1.25 mg/dL — AB (ref 0.61–1.24)

## 2017-10-02 LAB — HIV ANTIBODY (ROUTINE TESTING W REFLEX): HIV SCREEN 4TH GENERATION: NONREACTIVE

## 2017-10-02 MED ORDER — SENNOSIDES-DOCUSATE SODIUM 8.6-50 MG PO TABS: 1.0000 | ORAL_TABLET | Freq: Every evening | ORAL | Status: DC | PRN

## 2017-10-02 MED ORDER — ASPIRIN 325 MG PO TABS
325.0000 mg | ORAL_TABLET | Freq: Every day | ORAL | Status: DC
  Administered 2017-10-03 – 2017-10-15 (×13): 325 mg via ORAL
  Filled 2017-10-02 (×13): qty 1

## 2017-10-02 MED ORDER — ENOXAPARIN SODIUM 40 MG/0.4ML ~~LOC~~ SOLN
40.0000 mg | SUBCUTANEOUS | Status: DC
  Administered 2017-10-02 – 2017-10-14 (×13): 40 mg via SUBCUTANEOUS
  Filled 2017-10-02 (×13): qty 0.4

## 2017-10-02 MED ORDER — ACETAMINOPHEN 325 MG PO TABS
650.0000 mg | ORAL_TABLET | ORAL | Status: DC | PRN
  Administered 2017-10-07 – 2017-10-08 (×2): 650 mg via ORAL
  Filled 2017-10-02 (×3): qty 2

## 2017-10-02 MED ORDER — ONDANSETRON HCL 4 MG/2ML IJ SOLN: 4.0000 mg | Freq: Four times a day (QID) | INTRAMUSCULAR | Status: DC | PRN

## 2017-10-02 MED ORDER — HYDROCODONE-ACETAMINOPHEN 5-325 MG PO TABS
1.0000 | ORAL_TABLET | ORAL | Status: DC | PRN
Start: 2017-10-02 — End: 2017-10-14
  Administered 2017-10-03 – 2017-10-04 (×3): 1 via ORAL
  Administered 2017-10-09 – 2017-10-10 (×2): 2 via ORAL
  Filled 2017-10-02 (×3): qty 1
  Filled 2017-10-02 (×2): qty 2

## 2017-10-02 MED ORDER — SORBITOL 70 % SOLN
30.0000 mL | Freq: Every day | Status: DC | PRN
  Administered 2017-10-04: 30 mL via ORAL
  Filled 2017-10-02: qty 30

## 2017-10-02 MED ORDER — ATORVASTATIN CALCIUM 40 MG PO TABS
40.0000 mg | ORAL_TABLET | Freq: Every day | ORAL | Status: DC
  Administered 2017-10-02: 40 mg via ORAL
  Filled 2017-10-02: qty 1

## 2017-10-02 MED ORDER — HYDROCODONE-ACETAMINOPHEN 5-325 MG PO TABS
1.0000 | ORAL_TABLET | ORAL | Status: DC | PRN
  Administered 2017-10-02: 1 via ORAL
  Filled 2017-10-02: qty 1

## 2017-10-02 MED ORDER — ATORVASTATIN CALCIUM 40 MG PO TABS
40.0000 mg | ORAL_TABLET | Freq: Every day | ORAL | Status: DC
  Administered 2017-10-03 – 2017-10-14 (×12): 40 mg via ORAL
  Filled 2017-10-02 (×12): qty 1

## 2017-10-02 MED ORDER — ACETAMINOPHEN 160 MG/5ML PO SOLN: 650.0000 mg | ORAL | Status: DC | PRN

## 2017-10-02 MED ORDER — ENOXAPARIN SODIUM 40 MG/0.4ML ~~LOC~~ SOLN: 40.0000 mg | SUBCUTANEOUS | Status: DC

## 2017-10-02 MED ORDER — ONDANSETRON HCL 4 MG PO TABS: 4.0000 mg | ORAL_TABLET | Freq: Four times a day (QID) | ORAL | Status: DC | PRN

## 2017-10-02 MED ORDER — ASPIRIN 325 MG PO TABS: 325.0000 mg | ORAL_TABLET | Freq: Every day | ORAL | Status: AC

## 2017-10-02 MED ORDER — NICOTINE 21 MG/24HR TD PT24
21.0000 mg | MEDICATED_PATCH | Freq: Every day | TRANSDERMAL | Status: DC
  Administered 2017-10-02: 21 mg via TRANSDERMAL
  Filled 2017-10-02: qty 1

## 2017-10-02 MED ORDER — ACETAMINOPHEN 650 MG RE SUPP: 650.0000 mg | RECTAL | Status: DC | PRN

## 2017-10-02 MED ORDER — INFLUENZA VAC SPLIT QUAD 0.5 ML IM SUSY
0.5000 mL | PREFILLED_SYRINGE | INTRAMUSCULAR | Status: AC
  Administered 2017-10-03: 0.5 mL via INTRAMUSCULAR
  Filled 2017-10-02: qty 0.5

## 2017-10-02 MED ORDER — NICOTINE 21 MG/24HR TD PT24
21.0000 mg | MEDICATED_PATCH | Freq: Every day | TRANSDERMAL | Status: DC
  Administered 2017-10-03 – 2017-10-15 (×13): 21 mg via TRANSDERMAL
  Filled 2017-10-02 (×13): qty 1

## 2017-10-02 MED ORDER — ATORVASTATIN CALCIUM 40 MG PO TABS: 40.0000 mg | ORAL_TABLET | Freq: Every day | ORAL | Status: DC

## 2017-10-02 MED ORDER — ASPIRIN 300 MG RE SUPP
300.0000 mg | Freq: Every day | RECTAL | Status: DC
  Filled 2017-10-02 (×3): qty 1

## 2017-10-02 NOTE — Progress Notes (Addendum)
NEUROHOSPITALISTS STROKE TEAM - DAILY PROGRESS NOTE   ADMISSION HISTORY: Willie Herrera is an 57 y.o. male who presented to the [facility] ED this afternoon with new onset of LUE/LLE weakness, dysarthria and facial droop. The patient is from [place] and family provides language interpretation during the interview. The patient states he first noticed symptoms at sometime between midnight and 2 AM today, after getting out of bed for a smoke. When attempting to ambulate, his left side gave way and he fell. Since then, he was unable to walk without assistance. He recently had a URI per son. He denies confusion, difficulty understanding speech, difficulty expressing himself. No vision changes. No headache. Has has a stiff sensation to the back of his neck.     He has a diagnosis of HTN. He has never had a stroke. He does not take ASA at home.   SUBJECTIVE (INTERVAL HISTORY) Family is at the bedside. Patient is found laying in bed in NAD. Overall he feels his condition is gradually improving. Voices no new complaints. No new events reported overnight. Was able to ambulate in hallway with PT this AM. 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   OBJECTIVE Lab Results: CBC:  Recent Labs  Lab 10/01/17 1415 10/02/17 0458  WBC 9.1 8.8  HGB 14.9 14.7  HCT 42.5 43.0  MCV 73.5* 76.8*  PLT 228 211   BMP: Recent Labs  Lab 10/01/17 1415  NA 133*  K 3.5  CL 100*  CO2 22  GLUCOSE 104*  BUN 15  CREATININE 1.04  CALCIUM 8.8*   Liver Function Tests:  Recent Labs  Lab 10/01/17 1415  AST 32  ALT 29  ALKPHOS 71  BILITOT 0.7  PROT 8.6*  ALBUMIN 4.2   Cardiac Enzymes:  Recent Labs  Lab 10/01/17 1415  TROPONINI <0.03   Coagulation Studies:  Recent Labs    10/01/17 1415  APTT 30  INR 0.93   Urinalysis:  Recent Labs  Lab 10/01/17 1600  COLORURINE YELLOW  APPEARANCEUR CLEAR  LABSPEC 1.010  PHURINE 7.0  GLUCOSEU  NEGATIVE  HGBUR NEGATIVE  BILIRUBINUR NEGATIVE  KETONESUR NEGATIVE  PROTEINUR 30*  NITRITE NEGATIVE  LEUKOCYTESUR NEGATIVE   Urine Drug Screen:     Component Value Date/Time   LABOPIA NONE DETECTED 10/01/2017 1600   COCAINSCRNUR NONE DETECTED 10/01/2017 1600   LABBENZ NONE DETECTED 10/01/2017 1600   AMPHETMU NONE DETECTED 10/01/2017 1600   THCU NONE DETECTED 10/01/2017 1600   LABBARB NONE DETECTED 10/01/2017 1600    Alcohol Level:  Recent Labs  Lab 10/01/17 1415  ETH <10   PHYSICAL EXAM Temp:  [97.4 F (36.3 C)-98.7 F (37.1 C)] 98.7 F (37.1 C) (02/28 1232) Pulse Rate:  [66-82] 76 (02/28 1232) Resp:  [15-20] 20 (02/28 1232) BP: (138-201)/(89-125) 164/99 (02/28 1232) SpO2:  [94 %-98 %] 98 % (02/28 1232) Weight:  [82.6 kg (182 lb)] 82.6 kg (182 lb) (02/27 1353) General - Well nourished, well developed, in no apparent distress HEENT-  Normocephalic,  Cardiovascular - Regular rate and rhythm  Respiratory - Lungs clear bilaterally. No wheezing. Abdomen - soft and non-tender, BS normal Extremities- no edema or cyanosis Mental Status:  Alert, oriented, thought content appropriate.  Speech fluent without evidence of aphasia.  Able to follow all commands without difficulty. Mild dysarthria Cranial Nerves: II:  Visual fields intact bilaterally; no extinction to DSS. PERRL.  III,IV, VI: Ptosis not present, EOMI, but with some hesitancy in gazing to  the left. Without nystagmus V,VII: Left facial droop. Temp sensation equal bilaterally VIII: hearing intact to voice IX,X: Palate rises symmetrically XI: Mild weakness on the left XII: midline tongue extension  Motor: RUE 4+/5 except weaker at deltoid 4/5 LUE 5/5 RLE 4/5 LLE 5/5 Sensory: Temp and light touch intact x 4. Extinction on the left. Deep Tendon Reflexes:  Upper extremities 2+ bilaterally Right patella 2+, left patella 4+ Achilles 2+ bilaterally Plantars: Right: downgoing  Left: downgoing Cerebellar: No ataxia  with FNF on right. Ataxia is present with left FNF.  Gait: Deferred  IMAGING: I have personally reviewed the radiological images below and agree with the radiology interpretations. Ct Angio Head W Or Wo Contrast  Result Date: 10/01/2017 CLINICAL DATA:  58 year old male with left extremity weakness beginning at 0200 hours today. EXAM: CT ANGIOGRAPHY HEAD AND NECK TECHNIQUE: Multidetector CT imaging of the head and neck was performed using the standard protocol during bolus administration of intravenous contrast. Multiplanar CT image reconstructions and MIPs were obtained to evaluate the vascular anatomy. Carotid stenosis measurements (when applicable) are obtained utilizing NASCET criteria, using the distal internal carotid diameter as the denominator. CONTRAST:  ISOVUE-370 IOPAMIDOL (ISOVUE-370) INJECTION 76% COMPARISON:  Head CT without contrast 1437 hours today. FINDINGS: CTA NECK Skeleton: No acute osseous abnormality identified. Cervical spine disc, endplate, and facet degeneration. Upper chest: Bilateral upper lung atelectasis. No superior mediastinal lymphadenopathy. Other neck: Negative; partially retropharyngeal course of both carotids-more so the left. No neck mass or lymphadenopathy. Aortic arch: 3 vessel arch configuration with mild arch atherosclerosis distal to the left subclavian origin. No great vessel origin atherosclerosis or stenosis. Right carotid system: Negative aside from mild tortuosity. Widely patent right carotid bifurcation. Left carotid system: Negative aside from tortuosity. Widely patent left carotid bifurcation. Vertebral arteries: Normal proximal right subclavian artery. The right vertebral artery is non dominant and diminutive but patent from its origin to the skull base without stenosis identified. Normal proximal left subclavian artery. The left vertebral artery origin is normal (series 8, image 171). The left vertebral is dominant. The left V1 segment is tortuous with  a kinked appearance, but there is otherwise no cervical left vertebral artery stenosis. CTA HEAD Posterior circulation: Dominant distal left vertebral artery primarily supplies the basilar. The distal right vertebral artery functionally terminates in PICA. Normal left PICA origin. Patent basilar artery without stenosis. Normal SCA origins (duplicated on the right). There are fetal type bilateral PCA origins. Bilateral PCA branches are within normal limits. Anterior circulation: Both ICA siphons are patent. There is mild to moderate bilateral ICA siphon calcified plaque. No hemodynamically significant stenosis occurs on the left. Mild if any right ICA siphon stenosis in the cavernous and supraclinoid segments. The bilateral ophthalmic and posterior communicating artery origins are normal. Patent carotid termini. Normal MCA and ACA origins. Anterior communicating artery is diminutive or absent. Bilateral ACA branches are within normal limits. Left MCA M1 segment and bifurcation are patent. There is mild irregularity and stenosis at the dominant posterior left M2 origin best seen on series 11, image 19. Left MCA branches otherwise are within normal limits. The right MCA M1 segment and bifurcation are patent and appear normal. Right MCA branches are within normal limits. Venous sinuses: Patent. Anatomic variants: Dominant left vertebral artery, the right terminates in PICA. Fetal type bilateral PCA origins. Delayed phase: Age indeterminate hypodensity re-demonstrated at the head of the caudate nucleus (series 13, image 15). Hypodensity also along the anterior limb of the left external capsule  appears stable. No cortically based acute infarct identified. No abnormal enhancement identified. Review of the MIP images confirms the above findings IMPRESSION: 1. Negative for emergent large vessel occlusion. No hemodynamically significant arterial stenosis in the head or neck. 2. No significant extracranial atherosclerosis.  Mild to moderate intracranial atherosclerosis most apparent at the ICA siphons and left MCA bifurcation. 3. Stable CT appearance of the brain from 1437 hours today. Age indeterminate small vessel ischemia in the right basal ganglia and left deep white matter capsules. Electronically Signed   By: Odessa Fleming M.D.   On: 10/01/2017 16:09   Dg Chest 2 View  Result Date: 10/01/2017 CLINICAL DATA:  57 year old male with left side weakness beginning at 0200 hours. EXAM: CHEST  2 VIEW COMPARISON:  Chest radiographs 05/12/2015. FINDINGS: AP and lateral views of the chest. Lung volumes are stable and within normal limits. Mediastinal contours remain normal. Visualized tracheal air column is within normal limits. No pneumothorax, pleural effusion or confluent pulmonary opacity. There is bilateral pulmonary vascular congestion, but no overt edema. No acute osseous abnormality identified. Negative visible bowel gas pattern. IMPRESSION: Mild pulmonary vascular congestion without overt edema. No other acute findings. Electronically Signed   By: Odessa Fleming M.D.   On: 10/01/2017 15:16   Ct Angio Neck W And/or Wo Contrast  Result Date: 10/01/2017 CLINICAL DATA:  57 year old male with left extremity weakness beginning at 0200 hours today. EXAM: CT ANGIOGRAPHY HEAD AND NECK TECHNIQUE: Multidetector CT imaging of the head and neck was performed using the standard protocol during bolus administration of intravenous contrast. Multiplanar CT image reconstructions and MIPs were obtained to evaluate the vascular anatomy. Carotid stenosis measurements (when applicable) are obtained utilizing NASCET criteria, using the distal internal carotid diameter as the denominator. CONTRAST:  ISOVUE-370 IOPAMIDOL (ISOVUE-370) INJECTION 76% COMPARISON:  Head CT without contrast 1437 hours today. FINDINGS: CTA NECK Skeleton: No acute osseous abnormality identified. Cervical spine disc, endplate, and facet degeneration. Upper chest: Bilateral upper  lung atelectasis. No superior mediastinal lymphadenopathy. Other neck: Negative; partially retropharyngeal course of both carotids-more so the left. No neck mass or lymphadenopathy. Aortic arch: 3 vessel arch configuration with mild arch atherosclerosis distal to the left subclavian origin. No great vessel origin atherosclerosis or stenosis. Right carotid system: Negative aside from mild tortuosity. Widely patent right carotid bifurcation. Left carotid system: Negative aside from tortuosity. Widely patent left carotid bifurcation. Vertebral arteries: Normal proximal right subclavian artery. The right vertebral artery is non dominant and diminutive but patent from its origin to the skull base without stenosis identified. Normal proximal left subclavian artery. The left vertebral artery origin is normal (series 8, image 171). The left vertebral is dominant. The left V1 segment is tortuous with a kinked appearance, but there is otherwise no cervical left vertebral artery stenosis. CTA HEAD Posterior circulation: Dominant distal left vertebral artery primarily supplies the basilar. The distal right vertebral artery functionally terminates in PICA. Normal left PICA origin. Patent basilar artery without stenosis. Normal SCA origins (duplicated on the right). There are fetal type bilateral PCA origins. Bilateral PCA branches are within normal limits. Anterior circulation: Both ICA siphons are patent. There is mild to moderate bilateral ICA siphon calcified plaque. No hemodynamically significant stenosis occurs on the left. Mild if any right ICA siphon stenosis in the cavernous and supraclinoid segments. The bilateral ophthalmic and posterior communicating artery origins are normal. Patent carotid termini. Normal MCA and ACA origins. Anterior communicating artery is diminutive or absent. Bilateral ACA branches are within  normal limits. Left MCA M1 segment and bifurcation are patent. There is mild irregularity and stenosis  at the dominant posterior left M2 origin best seen on series 11, image 19. Left MCA branches otherwise are within normal limits. The right MCA M1 segment and bifurcation are patent and appear normal. Right MCA branches are within normal limits. Venous sinuses: Patent. Anatomic variants: Dominant left vertebral artery, the right terminates in PICA. Fetal type bilateral PCA origins. Delayed phase: Age indeterminate hypodensity re-demonstrated at the head of the caudate nucleus (series 13, image 15). Hypodensity also along the anterior limb of the left external capsule appears stable. No cortically based acute infarct identified. No abnormal enhancement identified. Review of the MIP images confirms the above findings IMPRESSION: 1. Negative for emergent large vessel occlusion. No hemodynamically significant arterial stenosis in the head or neck. 2. No significant extracranial atherosclerosis. Mild to moderate intracranial atherosclerosis most apparent at the ICA siphons and left MCA bifurcation. 3. Stable CT appearance of the brain from 1437 hours today. Age indeterminate small vessel ischemia in the right basal ganglia and left deep white matter capsules. Electronically Signed   By: Odessa Fleming M.D.   On: 10/01/2017 16:09   Mr Brain Wo Contrast  Result Date: 10/02/2017 CLINICAL DATA:  57 y/o  M; weakness involving left leg and left arm. EXAM: MRI HEAD WITHOUT CONTRAST MRA HEAD WITHOUT CONTRAST TECHNIQUE: Multiplanar, multiecho pulse sequences of the brain and surrounding structures were obtained without intravenous contrast. Angiographic images of the head were obtained using MRA technique without contrast. COMPARISON:  10/01/2017 CT head and CT angiogram head. FINDINGS: MRI HEAD FINDINGS Brain: 1.7 x 0.7 x 0.6 cm (volume = 0.4 cm^3) focus of reduced diffusion in the right posterior limb of internal capsule compatible with acute/early subacute infarction. No hemorrhage or mass effect. Small chronic lacunar infarcts  are present within bilateral putamen and right caudate head. Fewnonspecific foci of T2 FLAIR hyperintense signal abnormality in subcortical and periventricular white matter are compatible withmildchronic microvascular ischemic changes for age. Mildbrain parenchymal volume loss. No hydrocephalus, extra-axial collection, or effacement of basilar cisterns. Vascular: As below. Skull and upper cervical spine: Normal marrow signal. Sinuses/Orbits: Negative. Other: None. MRA HEAD FINDINGS Internal carotid arteries: Patent. Irregularity of bilateral carotid siphons compatible with atherosclerosis. Mild proximal right cavernous stenosis. Anterior cerebral arteries:  Patent. Middle cerebral arteries: Patent. Left M2 inferior division origin mild stenosis. Anterior communicating artery: Not identified, likely hypoplastic or absent. Posterior communicating arteries:  Patent.  Bilateral fetal PCA. Posterior cerebral arteries:  Patent. Basilar artery:  Patent. Vertebral arteries: Patent. Diminutive right vertebral artery terminates in right PICA. No evidence of high-grade stenosis, large vessel occlusion, or aneurysm identified. IMPRESSION: 1. Small acute/early subacute infarction within right posterior limb of internal capsule. No hemorrhage or mass effect. 2. Mild chronic microvascular ischemic changes and parenchymal volume loss of the brain. Small chronic lacunar infarctions within the basal ganglia. 3. Patent anterior and posterior intracranial circulation. No large vessel occlusion, aneurysm, or significant stenosis. These results will be called to the ordering clinician or representative by the Radiologist Assistant, and communication documented in the PACS or zVision Dashboard. Electronically Signed   By: Mitzi Hansen M.D.   On: 10/02/2017 03:15   Mr Maxine Glenn Head Wo Contrast  Result Date: 10/02/2017 CLINICAL DATA:  57 y/o  M; weakness involving left leg and left arm. EXAM: MRI HEAD WITHOUT CONTRAST MRA HEAD  WITHOUT CONTRAST TECHNIQUE: Multiplanar, multiecho pulse sequences of the brain and surrounding structures were  obtained without intravenous contrast. Angiographic images of the head were obtained using MRA technique without contrast. COMPARISON:  10/01/2017 CT head and CT angiogram head. FINDINGS: MRI HEAD FINDINGS Brain: 1.7 x 0.7 x 0.6 cm (volume = 0.4 cm^3) focus of reduced diffusion in the right posterior limb of internal capsule compatible with acute/early subacute infarction. No hemorrhage or mass effect. Small chronic lacunar infarcts are present within bilateral putamen and right caudate head. Fewnonspecific foci of T2 FLAIR hyperintense signal abnormality in subcortical and periventricular white matter are compatible withmildchronic microvascular ischemic changes for age. Mildbrain parenchymal volume loss. No hydrocephalus, extra-axial collection, or effacement of basilar cisterns. Vascular: As below. Skull and upper cervical spine: Normal marrow signal. Sinuses/Orbits: Negative. Other: None. MRA HEAD FINDINGS Internal carotid arteries: Patent. Irregularity of bilateral carotid siphons compatible with atherosclerosis. Mild proximal right cavernous stenosis. Anterior cerebral arteries:  Patent. Middle cerebral arteries: Patent. Left M2 inferior division origin mild stenosis. Anterior communicating artery: Not identified, likely hypoplastic or absent. Posterior communicating arteries:  Patent.  Bilateral fetal PCA. Posterior cerebral arteries:  Patent. Basilar artery:  Patent. Vertebral arteries: Patent. Diminutive right vertebral artery terminates in right PICA. No evidence of high-grade stenosis, large vessel occlusion, or aneurysm identified. IMPRESSION: 1. Small acute/early subacute infarction within right posterior limb of internal capsule. No hemorrhage or mass effect. 2. Mild chronic microvascular ischemic changes and parenchymal volume loss of the brain. Small chronic lacunar infarctions within the  basal ganglia. 3. Patent anterior and posterior intracranial circulation. No large vessel occlusion, aneurysm, or significant stenosis. These results will be called to the ordering clinician or representative by the Radiologist Assistant, and communication documented in the PACS or zVision Dashboard. Electronically Signed   By: Mitzi HansenLance  Furusawa-Stratton M.D.   On: 10/02/2017 03:15   Ct Head Code Stroke Wo Contrast  Result Date: 10/01/2017 CLINICAL DATA:  Code stroke. Acute onset of left upper and lower extremity weakness beginning at 2 a.m. today, 12 hours ago. EXAM: CT HEAD WITHOUT CONTRAST TECHNIQUE: Contiguous axial images were obtained from the base of the skull through the vertex without intravenous contrast. COMPARISON:  None. FINDINGS: Brain: Subtle hypoattenuation is present in the right caudate head and anterior right insular cortex. Asymmetric white matter hypoattenuation is present in the left external capsule. Other scattered subcortical white matter hypoattenuation is present. No other focal cortical abnormality is present. No acute hemorrhage or mass lesion is present. The ventricles are of normal size. No significant extra-axial fluid collection is present. Vascular: Atherosclerotic calcifications are present within the cavernous internal carotid arteries bilaterally. There is no hyperdense vessel. : Calvarium is intact. No focal lytic or blastic lesions are present. Sinuses/Orbits: The paranasal sinuses and mastoid air cells are clear. Visualized globes and orbits are within normal limits. Other: ASPECTS (Alberta Stroke Program Early CT Score) - Ganglionic level infarction (caudate, lentiform nuclei, internal capsule, insula, M1-M3 cortex): 5/7 - Supraganglionic infarction (M4-M6 cortex): 3/3 Total score (0-10 with 10 being normal): 10/10 IMPRESSION: 1. Subtle hypoattenuation involving the right caudate head and anterior right insular cortex is concerning for acute/subacute ischemia. 2. Mild white  matter disease otherwise. This likely reflects the sequela of chronic microvascular ischemia. 3. ASPECTS is 8/10 These results were called by telephone at the time of interpretation on 10/01/2017 at 2:40 pm to Dr. Doug SouSAM JACUBOWITZ , who verbally acknowledged these results. Electronically Signed   By: Marin Robertshristopher  Mattern M.D.   On: 10/01/2017 14:42   Echocardiogram:  Study Conclusions - Left ventricle: The cavity size was normal. There was mild   concentric hypertrophy. Systolic function was normal. Wall motion   was normal; there were no regional wall motion abnormalities.   There was a reduced contribution of atrial contraction to   ventricular filling, due to increased ventricular diastolic   pressure or atrial contractile dysfunction. Doppler parameters   are consistent with restrictive physiology, indicative of   decreased left ventricular diastolic compliance and/or increased   left atrial pressure.     IMPRESSION: Mr. Willie Herrera is a 57 y.o. male with PMH of HTN, smoking and ETOH presented to [facility] ED with new onset of LUE/LLE weakness, dysarthria and facial droop. MRI reveals:   Small acute/early subacute infarction - right posterior limb internal capsule  Suspected Etiology: small vessel disease Resultant Symptoms: LUE/LLE weakness, dysarthria and facial droop. Stroke Risk Factors: diabetes mellitus, hyperlipidemia, hypertension and smoking Other Stroke Risk Factors: Advanced age, Cigarette smoker, ETOH use Obesity, Body mass index is 31.24 kg/m. , Hx stroke  Outstanding Stroke Work-up Studies:    Work up completed  PLAN  10/02/2017: Continue Aspirin/ Statin Frequent neuro checks Telemetry monitoring PT/OT/SLP Consult PM & Rehab - would be a good rehab candidate, good family support Consult Case Management /MSW Ongoing aggressive stroke risk factor management Patient counseled to be compliant with his antithrombotic  medications Patient counseled on Lifestyle modifications including, Diet, Exercise, and Stress Follow up with [facility] in 6 weeks  HX OF STROKES: Small, chronic lacunar infarcts in the Basal ganglia  HYPERTENSION: Stable, some elevated B/P's noted overnight Permissive hypertension (OK if <220/120) for 24-48 hours post stroke and then gradually normalized within 5-7 days. Long term BP goal normotensive. May slowly start B/P medications after 48 hours Home Meds: NONE  HYPERLIPIDEMIA:    Component Value Date/Time   CHOL 232 (H) 10/02/2017 0458   TRIG 105 10/02/2017 0458   HDL 69 10/02/2017 0458   CHOLHDL 3.4 10/02/2017 0458   VLDL 21 10/02/2017 0458   LDLCALC 142 (H) 10/02/2017 0458  Home Meds:  NONE LDL  goal < 70 Started on Lipitor to 40 mg daily Continue statin at discharge  PRE- DIABETES: Lab Results  Component Value Date   HGBA1C 6.2 (H) 10/02/2017  HgbA1c goal < 7.0 Continue CBG monitoring and SSI to maintain glucose 140-180 mg/dl DM education   TOBACCO ABUSE and ETOH Current smoker Smoking cessation counseling provided Nicotine patch provided Encouraged to decrease alcohol intake  OBESITY Obesity, Body mass index is 31.24 kg/m. Greater than/equal to 30  Other Active Problems: Principal Problem:   Acute CVA (cerebrovascular accident) [facility]) Active Problems:   Hypertensive urgency    Hospital day # 1 VTE prophylaxis: Lovenox  Diet : Fall precautions Diet Heart Room service appropriate? Yes; Fluid consistency: Thin   FAMILY UPDATES:  family at bedside  TEAM UPDATES: Maretta Bees, MD     Prior Home Stroke Medications:  No antithrombotic  Discharge Stroke Meds:  Please discharge patient on aspirin 325 mg daily   Disposition: Final discharge disposition not confirmed Therapy Recs:               PENDING Follow Up:  Follow-up Information    Nilda Riggs, NP. Schedule an appointment as soon as possible for a visit in 6  week(s).   Specialty:  Family Medicine Contact information: [address] [place] [address] [phone]  Saguier, Kateri Mc -PCP Follow up in 1-2 weeks      Assessment & plan discussed with with attending physician and they are in agreement.    Beryl Meager, ANP-C Stroke Neurology Team 10/02/2017 1:33 PM   10/02/2017 ATTENDING ASSESSMENT:   I reviewed above note and agree with the assessment and plan. I have made any additions or clarifications directly to the above note. Pt was seen and examined.   57 year old male with history of hypertension, heavy smoking, heavy alcohol admitted for left-sided weakness, left facial droop and fell.  CT initially concerning for right insular cortex infarct, however MRI showed right PLIC lacunar infarct with old left BG and right CR infarcts.  MRA unremarkable.  CTA showed bilateral siphon atherosclerosis but no significant stenosis.  2D echo showed normal EF.  LDL 142 and A1c 6.2.  UDS negative.  Son confirmed that patient at home heavy smoking and heavy alcohol, not taking any medication.  Patient stroke consistent with small vessel disease given multiple stroke risk factors.  He is on aspirin and Lipitor for stroke prevention.  Continue stroke risk factor modification.  PT/OT recommend CR.  Neurology will sign off. Please call with questions. Pt will follow up with stroke clinic NP at [facility] in about 4 weeks. Thanks for the consult.   Marvel Plan, MD PhD Stroke Neurology 10/02/2017 4:26 PM   To contact Stroke Continuity provider, please refer to [email or web address]. After hours, contact General Neurology

## 2017-10-02 NOTE — Progress Notes (Signed)
PROGRESS NOTE        PATIENT DETAILS Name: Willie Herrera Age: 57 y.o. Sex: male Date of Birth: 04/27/1961 Admit Date: 10/01/2017 Admitting Physician Briscoe Deutscherimothy S Opyd, MD WUJ:[id number]:Saguier, Kateri McEdward, PA-C  Brief Narrative: Patient is a 57 y.o. male with no prior significant past medical history-presented to the hospital after sustaining a mechanical fall at night, the next morning when he woke up he was found to have left-sided weakness.  He was admitted to the hospitalist service for further workup, MRI of the brain confirms acute CVA.  See below for further details  Subjective: Lying comfortably in bed.  Assessment/Plan: Acute CVA: Very mild left-sided deficits-MRI brain confirms a small acute/early subacute infarction in the posterior limb of the right internal capsule.  Suspec small vessel disease as the culprit etiology for CVA.  Echocardiogram with preserved EF, with no evidence of embolic CVA.  CT angiogram of the neck did not show any major stenosis.   LDL 142-has been started on high intensity statin.  A1c 6.2-we will advise diet and exercise regimen.  Continue aspirin.  Await further input from stroke team, see IR has been consulted.  Hypertension: Allow permissive hypertension-we will slowly initiate antihypertensives.  DVT Prophylaxis: Prophylactic Lovenox   Code Status: Full code   Family Communication: Son at bedside  Disposition Plan: Remain inpatient-awaiting CIR evaluation for discharge disposition.  Antimicrobial agents: Anti-infectives (From admission, onward)   None      Procedures: nONE  CONSULTS:  neurology and cir  Time spent: 25 minutes-Greater than 50% of this time was spent in counseling, explanation of diagnosis, planning of further management, and coordination of care.  MEDICATIONS: Scheduled Meds: . aspirin  300 mg Rectal Daily   Or  . aspirin  325 mg Oral Daily  . atorvastatin  40 mg Oral q1800  . enoxaparin (LOVENOX)  injection  40 mg Subcutaneous Q24H  . nicotine  21 mg Transdermal Daily   Continuous Infusions: PRN Meds:.acetaminophen **OR** acetaminophen (TYLENOL) oral liquid 160 mg/5 mL **OR** acetaminophen, HYDROcodone-acetaminophen, labetalol, senna-docusate   PHYSICAL EXAM: Vital signs: Vitals:   10/02/17 0842 10/02/17 0923 10/02/17 1051 10/02/17 1232  BP: (!) 169/89 (!) 138/111 (!) 171/106 (!) 164/99  Pulse:    76  Resp:    20  Temp:    98.7 F (37.1 C)  TempSrc:    Oral  SpO2:    98%  Weight:      Height:       Filed Weights   10/01/17 1353  Weight: 82.6 kg (182 lb)   Body mass index is 31.24 kg/m.   General appearance :Awake, alert, not in any distress.  Eyes:, pupils equally reactive to light and accomodation,no scleral icterus. HEENT: Atraumatic and Normocephalic Neck: supple, no JVD. No cervical lymphadenopathy. Resp:Good air entry bilaterally, no added sounds  CVS: S1 S2 regular, no murmurs.  GI: Bowel sounds present, Non tender and not distended with no gaurding, rigidity or rebound.No organomegaly Extremities: B/L Lower Ext shows no edema, both legs are warm to touch Neurology:  speech clear, slight left-sided facial droop, mild left-sided weakness. Psychiatric: Normal judgment and insight. Alert and oriented x 3. Normal mood. Musculoskeletal:No digital cyanosis Skin:No Rash, warm and dry Wounds:N/A  I have personally reviewed following labs and imaging studies  LABORATORY DATA: CBC: Recent Labs  Lab 10/01/17 1415 10/02/17 0458  WBC 9.1  8.8  NEUTROABS 5.3  --   HGB 14.9 14.7  HCT 42.5 43.0  MCV 73.5* 76.8*  PLT 228 211    Basic Metabolic Panel: Recent Labs  Lab 10/01/17 1415  NA 133*  K 3.5  CL 100*  CO2 22  GLUCOSE 104*  BUN 15  CREATININE 1.04  CALCIUM 8.8*    GFR: Estimated Creatinine Clearance: 78.8 mL/min (by C-G formula based on SCr of 1.04 mg/dL).  Liver Function Tests: Recent Labs  Lab 10/01/17 1415  AST 32  ALT 29  ALKPHOS 71    BILITOT 0.7  PROT 8.6*  ALBUMIN 4.2   No results for input(s): LIPASE, AMYLASE in the last 168 hours. No results for input(s): AMMONIA in the last 168 hours.  Coagulation Profile: Recent Labs  Lab 10/01/17 1415  INR 0.93    Cardiac Enzymes: Recent Labs  Lab 10/01/17 1415  TROPONINI <0.03    BNP (last 3 results) No results for input(s): PROBNP in the last 8760 hours.  HbA1C: Recent Labs    10/02/17 0458  HGBA1C 6.2*    CBG: Recent Labs  Lab 10/01/17 1354 10/01/17 1416  GLUCAP 97 100*    Lipid Profile: Recent Labs    10/02/17 0458  CHOL 232*  HDL 69  LDLCALC 142*  TRIG 105  CHOLHDL 3.4    Thyroid Function Tests: No results for input(s): TSH, T4TOTAL, FREET4, T3FREE, THYROIDAB in the last 72 hours.  Anemia Panel: No results for input(s): VITAMINB12, FOLATE, FERRITIN, TIBC, IRON, RETICCTPCT in the last 72 hours.  Urine analysis:    Component Value Date/Time   COLORURINE YELLOW 10/01/2017 1600   APPEARANCEUR CLEAR 10/01/2017 1600   LABSPEC 1.010 10/01/2017 1600   PHURINE 7.0 10/01/2017 1600   GLUCOSEU NEGATIVE 10/01/2017 1600   HGBUR NEGATIVE 10/01/2017 1600   BILIRUBINUR NEGATIVE 10/01/2017 1600   KETONESUR NEGATIVE 10/01/2017 1600   PROTEINUR 30 (A) 10/01/2017 1600   NITRITE NEGATIVE 10/01/2017 1600   LEUKOCYTESUR NEGATIVE 10/01/2017 1600    Sepsis Labs: Lactic Acid, Venous No results found for: LATICACIDVEN  MICROBIOLOGY: No results found for this or any previous visit (from the past 240 hour(s)).  RADIOLOGY STUDIES/RESULTS: Ct Angio Head W Or Wo Contrast  Result Date: 10/01/2017 CLINICAL DATA:  57 year old male with left extremity weakness beginning at 0200 hours today. EXAM: CT ANGIOGRAPHY HEAD AND NECK TECHNIQUE: Multidetector CT imaging of the head and neck was performed using the standard protocol during bolus administration of intravenous contrast. Multiplanar CT image reconstructions and MIPs were obtained to evaluate the  vascular anatomy. Carotid stenosis measurements (when applicable) are obtained utilizing NASCET criteria, using the distal internal carotid diameter as the denominator. CONTRAST:  ISOVUE-370 IOPAMIDOL (ISOVUE-370) INJECTION 76% COMPARISON:  Head CT without contrast 1437 hours today. FINDINGS: CTA NECK Skeleton: No acute osseous abnormality identified. Cervical spine disc, endplate, and facet degeneration. Upper chest: Bilateral upper lung atelectasis. No superior mediastinal lymphadenopathy. Other neck: Negative; partially retropharyngeal course of both carotids-more so the left. No neck mass or lymphadenopathy. Aortic arch: 3 vessel arch configuration with mild arch atherosclerosis distal to the left subclavian origin. No great vessel origin atherosclerosis or stenosis. Right carotid system: Negative aside from mild tortuosity. Widely patent right carotid bifurcation. Left carotid system: Negative aside from tortuosity. Widely patent left carotid bifurcation. Vertebral arteries: Normal proximal right subclavian artery. The right vertebral artery is non dominant and diminutive but patent from its origin to the skull base without stenosis identified. Normal proximal left subclavian artery.  The left vertebral artery origin is normal (series 8, image 171). The left vertebral is dominant. The left V1 segment is tortuous with a kinked appearance, but there is otherwise no cervical left vertebral artery stenosis. CTA HEAD Posterior circulation: Dominant distal left vertebral artery primarily supplies the basilar. The distal right vertebral artery functionally terminates in PICA. Normal left PICA origin. Patent basilar artery without stenosis. Normal SCA origins (duplicated on the right). There are fetal type bilateral PCA origins. Bilateral PCA branches are within normal limits. Anterior circulation: Both ICA siphons are patent. There is mild to moderate bilateral ICA siphon calcified plaque. No hemodynamically  significant stenosis occurs on the left. Mild if any right ICA siphon stenosis in the cavernous and supraclinoid segments. The bilateral ophthalmic and posterior communicating artery origins are normal. Patent carotid termini. Normal MCA and ACA origins. Anterior communicating artery is diminutive or absent. Bilateral ACA branches are within normal limits. Left MCA M1 segment and bifurcation are patent. There is mild irregularity and stenosis at the dominant posterior left M2 origin best seen on series 11, image 19. Left MCA branches otherwise are within normal limits. The right MCA M1 segment and bifurcation are patent and appear normal. Right MCA branches are within normal limits. Venous sinuses: Patent. Anatomic variants: Dominant left vertebral artery, the right terminates in PICA. Fetal type bilateral PCA origins. Delayed phase: Age indeterminate hypodensity re-demonstrated at the head of the caudate nucleus (series 13, image 15). Hypodensity also along the anterior limb of the left external capsule appears stable. No cortically based acute infarct identified. No abnormal enhancement identified. Review of the MIP images confirms the above findings IMPRESSION: 1. Negative for emergent large vessel occlusion. No hemodynamically significant arterial stenosis in the head or neck. 2. No significant extracranial atherosclerosis. Mild to moderate intracranial atherosclerosis most apparent at the ICA siphons and left MCA bifurcation. 3. Stable CT appearance of the brain from 1437 hours today. Age indeterminate small vessel ischemia in the right basal ganglia and left deep white matter capsules. Electronically Signed   By: Odessa Fleming M.D.   On: 10/01/2017 16:09   Dg Chest 2 View  Result Date: 10/01/2017 CLINICAL DATA:  57 year old male with left side weakness beginning at 0200 hours. EXAM: CHEST  2 VIEW COMPARISON:  Chest radiographs 05/12/2015. FINDINGS: AP and lateral views of the chest. Lung volumes are stable and  within normal limits. Mediastinal contours remain normal. Visualized tracheal air column is within normal limits. No pneumothorax, pleural effusion or confluent pulmonary opacity. There is bilateral pulmonary vascular congestion, but no overt edema. No acute osseous abnormality identified. Negative visible bowel gas pattern. IMPRESSION: Mild pulmonary vascular congestion without overt edema. No other acute findings. Electronically Signed   By: Odessa Fleming M.D.   On: 10/01/2017 15:16   Ct Angio Neck W And/or Wo Contrast  Result Date: 10/01/2017 CLINICAL DATA:  57 year old male with left extremity weakness beginning at 0200 hours today. EXAM: CT ANGIOGRAPHY HEAD AND NECK TECHNIQUE: Multidetector CT imaging of the head and neck was performed using the standard protocol during bolus administration of intravenous contrast. Multiplanar CT image reconstructions and MIPs were obtained to evaluate the vascular anatomy. Carotid stenosis measurements (when applicable) are obtained utilizing NASCET criteria, using the distal internal carotid diameter as the denominator. CONTRAST:  ISOVUE-370 IOPAMIDOL (ISOVUE-370) INJECTION 76% COMPARISON:  Head CT without contrast 1437 hours today. FINDINGS: CTA NECK Skeleton: No acute osseous abnormality identified. Cervical spine disc, endplate, and facet degeneration. Upper chest: Bilateral upper  lung atelectasis. No superior mediastinal lymphadenopathy. Other neck: Negative; partially retropharyngeal course of both carotids-more so the left. No neck mass or lymphadenopathy. Aortic arch: 3 vessel arch configuration with mild arch atherosclerosis distal to the left subclavian origin. No great vessel origin atherosclerosis or stenosis. Right carotid system: Negative aside from mild tortuosity. Widely patent right carotid bifurcation. Left carotid system: Negative aside from tortuosity. Widely patent left carotid bifurcation. Vertebral arteries: Normal proximal right subclavian artery.  The right vertebral artery is non dominant and diminutive but patent from its origin to the skull base without stenosis identified. Normal proximal left subclavian artery. The left vertebral artery origin is normal (series 8, image 171). The left vertebral is dominant. The left V1 segment is tortuous with a kinked appearance, but there is otherwise no cervical left vertebral artery stenosis. CTA HEAD Posterior circulation: Dominant distal left vertebral artery primarily supplies the basilar. The distal right vertebral artery functionally terminates in PICA. Normal left PICA origin. Patent basilar artery without stenosis. Normal SCA origins (duplicated on the right). There are fetal type bilateral PCA origins. Bilateral PCA branches are within normal limits. Anterior circulation: Both ICA siphons are patent. There is mild to moderate bilateral ICA siphon calcified plaque. No hemodynamically significant stenosis occurs on the left. Mild if any right ICA siphon stenosis in the cavernous and supraclinoid segments. The bilateral ophthalmic and posterior communicating artery origins are normal. Patent carotid termini. Normal MCA and ACA origins. Anterior communicating artery is diminutive or absent. Bilateral ACA branches are within normal limits. Left MCA M1 segment and bifurcation are patent. There is mild irregularity and stenosis at the dominant posterior left M2 origin best seen on series 11, image 19. Left MCA branches otherwise are within normal limits. The right MCA M1 segment and bifurcation are patent and appear normal. Right MCA branches are within normal limits. Venous sinuses: Patent. Anatomic variants: Dominant left vertebral artery, the right terminates in PICA. Fetal type bilateral PCA origins. Delayed phase: Age indeterminate hypodensity re-demonstrated at the head of the caudate nucleus (series 13, image 15). Hypodensity also along the anterior limb of the left external capsule appears stable. No  cortically based acute infarct identified. No abnormal enhancement identified. Review of the MIP images confirms the above findings IMPRESSION: 1. Negative for emergent large vessel occlusion. No hemodynamically significant arterial stenosis in the head or neck. 2. No significant extracranial atherosclerosis. Mild to moderate intracranial atherosclerosis most apparent at the ICA siphons and left MCA bifurcation. 3. Stable CT appearance of the brain from 1437 hours today. Age indeterminate small vessel ischemia in the right basal ganglia and left deep white matter capsules. Electronically Signed   By: Odessa Fleming M.D.   On: 10/01/2017 16:09   Mr Brain Wo Contrast  Result Date: 10/02/2017 CLINICAL DATA:  57 y/o  M; weakness involving left leg and left arm. EXAM: MRI HEAD WITHOUT CONTRAST MRA HEAD WITHOUT CONTRAST TECHNIQUE: Multiplanar, multiecho pulse sequences of the brain and surrounding structures were obtained without intravenous contrast. Angiographic images of the head were obtained using MRA technique without contrast. COMPARISON:  10/01/2017 CT head and CT angiogram head. FINDINGS: MRI HEAD FINDINGS Brain: 1.7 x 0.7 x 0.6 cm (volume = 0.4 cm^3) focus of reduced diffusion in the right posterior limb of internal capsule compatible with acute/early subacute infarction. No hemorrhage or mass effect. Small chronic lacunar infarcts are present within bilateral putamen and right caudate head. Fewnonspecific foci of T2 FLAIR hyperintense signal abnormality in subcortical and periventricular white matter  are compatible withmildchronic microvascular ischemic changes for age. Mildbrain parenchymal volume loss. No hydrocephalus, extra-axial collection, or effacement of basilar cisterns. Vascular: As below. Skull and upper cervical spine: Normal marrow signal. Sinuses/Orbits: Negative. Other: None. MRA HEAD FINDINGS Internal carotid arteries: Patent. Irregularity of bilateral carotid siphons compatible with  atherosclerosis. Mild proximal right cavernous stenosis. Anterior cerebral arteries:  Patent. Middle cerebral arteries: Patent. Left M2 inferior division origin mild stenosis. Anterior communicating artery: Not identified, likely hypoplastic or absent. Posterior communicating arteries:  Patent.  Bilateral fetal PCA. Posterior cerebral arteries:  Patent. Basilar artery:  Patent. Vertebral arteries: Patent. Diminutive right vertebral artery terminates in right PICA. No evidence of high-grade stenosis, large vessel occlusion, or aneurysm identified. IMPRESSION: 1. Small acute/early subacute infarction within right posterior limb of internal capsule. No hemorrhage or mass effect. 2. Mild chronic microvascular ischemic changes and parenchymal volume loss of the brain. Small chronic lacunar infarctions within the basal ganglia. 3. Patent anterior and posterior intracranial circulation. No large vessel occlusion, aneurysm, or significant stenosis. These results will be called to the ordering clinician or representative by the Radiologist Assistant, and communication documented in the PACS or zVision Dashboard. Electronically Signed   By: Mitzi Hansen M.D.   On: 10/02/2017 03:15   Mr Maxine Glenn Head Wo Contrast  Result Date: 10/02/2017 CLINICAL DATA:  57 y/o  M; weakness involving left leg and left arm. EXAM: MRI HEAD WITHOUT CONTRAST MRA HEAD WITHOUT CONTRAST TECHNIQUE: Multiplanar, multiecho pulse sequences of the brain and surrounding structures were obtained without intravenous contrast. Angiographic images of the head were obtained using MRA technique without contrast. COMPARISON:  10/01/2017 CT head and CT angiogram head. FINDINGS: MRI HEAD FINDINGS Brain: 1.7 x 0.7 x 0.6 cm (volume = 0.4 cm^3) focus of reduced diffusion in the right posterior limb of internal capsule compatible with acute/early subacute infarction. No hemorrhage or mass effect. Small chronic lacunar infarcts are present within bilateral  putamen and right caudate head. Fewnonspecific foci of T2 FLAIR hyperintense signal abnormality in subcortical and periventricular white matter are compatible withmildchronic microvascular ischemic changes for age. Mildbrain parenchymal volume loss. No hydrocephalus, extra-axial collection, or effacement of basilar cisterns. Vascular: As below. Skull and upper cervical spine: Normal marrow signal. Sinuses/Orbits: Negative. Other: None. MRA HEAD FINDINGS Internal carotid arteries: Patent. Irregularity of bilateral carotid siphons compatible with atherosclerosis. Mild proximal right cavernous stenosis. Anterior cerebral arteries:  Patent. Middle cerebral arteries: Patent. Left M2 inferior division origin mild stenosis. Anterior communicating artery: Not identified, likely hypoplastic or absent. Posterior communicating arteries:  Patent.  Bilateral fetal PCA. Posterior cerebral arteries:  Patent. Basilar artery:  Patent. Vertebral arteries: Patent. Diminutive right vertebral artery terminates in right PICA. No evidence of high-grade stenosis, large vessel occlusion, or aneurysm identified. IMPRESSION: 1. Small acute/early subacute infarction within right posterior limb of internal capsule. No hemorrhage or mass effect. 2. Mild chronic microvascular ischemic changes and parenchymal volume loss of the brain. Small chronic lacunar infarctions within the basal ganglia. 3. Patent anterior and posterior intracranial circulation. No large vessel occlusion, aneurysm, or significant stenosis. These results will be called to the ordering clinician or representative by the Radiologist Assistant, and communication documented in the PACS or zVision Dashboard. Electronically Signed   By: Mitzi Hansen M.D.   On: 10/02/2017 03:15   Ct Head Code Stroke Wo Contrast  Result Date: 10/01/2017 CLINICAL DATA:  Code stroke. Acute onset of left upper and lower extremity weakness beginning at 2 a.m. today, 12 hours ago. EXAM: CT  HEAD WITHOUT CONTRAST TECHNIQUE: Contiguous axial images were obtained from the base of the skull through the vertex without intravenous contrast. COMPARISON:  None. FINDINGS: Brain: Subtle hypoattenuation is present in the right caudate head and anterior right insular cortex. Asymmetric white matter hypoattenuation is present in the left external capsule. Other scattered subcortical white matter hypoattenuation is present. No other focal cortical abnormality is present. No acute hemorrhage or mass lesion is present. The ventricles are of normal size. No significant extra-axial fluid collection is present. Vascular: Atherosclerotic calcifications are present within the cavernous internal carotid arteries bilaterally. There is no hyperdense vessel. : Calvarium is intact. No focal lytic or blastic lesions are present. Sinuses/Orbits: The paranasal sinuses and mastoid air cells are clear. Visualized globes and orbits are within normal limits. Other: ASPECTS ([place] Stroke Program Early CT Score) - Ganglionic level infarction (caudate, lentiform nuclei, internal capsule, insula, M1-M3 cortex): 5/7 - Supraganglionic infarction (M4-M6 cortex): 3/3 Total score (0-10 with 10 being normal): 10/10 IMPRESSION: 1. Subtle hypoattenuation involving the right caudate head and anterior right insular cortex is concerning for acute/subacute ischemia. 2. Mild white matter disease otherwise. This likely reflects the sequela of chronic microvascular ischemia. 3. ASPECTS is 8/10 These results were called by telephone at the time of interpretation on 10/01/2017 at 2:40 pm to Dr. Doug Sou , who verbally acknowledged these results. Electronically Signed   By: Marin Roberts M.D.   On: 10/01/2017 14:42     LOS: 1 day   Jeoffrey Massed, MD  [facility] Pager:[phone]  If 7PM-7AM, please contact night-coverage [email or web address] Password [email or web address] 10/02/2017, 1:30 PM

## 2017-10-02 NOTE — Progress Notes (Signed)
Cristina Gong, RN  Rehab Admission Coordinator  Physical Medicine and Rehabilitation  PMR Pre-admission  Signed  Date of Service:  10/02/2017 4:41 PM       Related encounter: ED to Hosp-Admission (Discharged) from 10/01/2017 in [facility]      Signed           [] Hide copied text  [] Hover for details   PMR Admission Coordinator Pre-Admission Assessment  Patient: Willie Herrera is an 57 y.o., male MRN: [id number] DOB: [birth year] Height: 5' 4"  (162.6 cm) Weight: 82.6 kg (182 lb)                                                                                                                                                  Insurance Information  PRIMARY: uninsured       I spoke with Development worker, community, Teacher, music at [phone]. She met with pt and family at bedside per my request  For possible medicaid application. She also discussed a 55 % discounted rate if not eligible for Medicaid.  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 He has not seen a doctor in 2-3 years. Presented 2/27/2019with left-sided weakness/fall and slurred speech. Patient hypertensive 200/120. EKG sinus rhythm with diffuse ST-ST abnormalities and no prior comparison. Troponin negative. Cranial CT scan showed subtle hypo attenuation involving the right caudate head and anterior right insular cortex concerning for acute subacute ischemia. CT a of the head and neck negative  for emergent large vessel occlusion. UDS negative. Patient did not receive TPA. MRI/MRAshowed small acute early subacute infarct within right posterior limb of internal capsule. No hemorrhage or mass effect.NoLarge vessel occlusion or aneurysm without stenosis.Currently on aspirin for CVA prophylaxis. Subcutaneous Lovenox for DVT prophylaxis. Echocardiogramshowed LVH no valvular disease, no Subia.[name] a regular diet   Total: 4 NIHSS  Past Medical History      Past Medical History:  Diagnosis Date  . Alcohol abuse   . HTN (hypertension) 08/25/2014  . Hyperlipidemia     Family History  family history includes Hypertension in his mother; Stroke in his maternal grandfather.  Prior Rehab/Hospitalizations:  Has the patient had major surgery during 100 days prior to admission? No  Current Medications   Current Facility-Administered Medications:  .  acetaminophen (TYLENOL) tablet 650 mg,  650 mg, Oral, Q4H PRN, 650 mg at 10/01/17 2204 **OR** acetaminophen (TYLENOL) solution 650 mg, 650 mg, Per Tube, Q4H PRN **OR** acetaminophen (TYLENOL) suppository 650 mg, 650 mg, Rectal, Q4H PRN, Opyd, Timothy S, MD .  aspirin suppository 300 mg, 300 mg, Rectal, Daily **OR** aspirin tablet 325 mg, 325 mg, Oral, Daily, Opyd, Ilene Qua, MD, 325 mg at 10/02/17 1048 .  atorvastatin (LIPITOR) tablet 40 mg, 40 mg, Oral, q1800, Costello, Mary A, NP .  enoxaparin (LOVENOX) injection 40 mg, 40 mg, Subcutaneous, Q24H, Opyd, Timothy S, MD .  HYDROcodone-acetaminophen (NORCO/VICODIN) 5-325 MG per tablet 1-2 tablet, 1-2 tablet, Oral, Q4H PRN, Schorr, Rhetta Mura, NP, 1 tablet at 10/02/17 0026 .  labetalol (NORMODYNE,TRANDATE) injection 5-10 mg, 5-10 mg, Intravenous, Q2H PRN, Opyd, Ilene Qua, MD, 5 mg at 10/02/17 0836 .  nicotine (NICODERM CQ - dosed in mg/24 hours) patch 21 mg, 21 mg, Transdermal, Daily, Costello, Mary A, NP, 21 mg at 10/02/17 1141 .  senna-docusate (Senokot-S) tablet 1 tablet, 1 tablet,  Oral, QHS PRN, Opyd, Ilene Qua, MD  Patients Current Diet: Fall precautions Diet Heart Room service appropriate? 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 None of the above  Prior Functional Level Prior Function Level of Independence: Independent Comments: (works through [employer] out of state and staying in)  Self Care: Did the patient need help bathing, dressing, using the toilet or eating?  Independent  Indoor Mobility: Did the patient need assistance with walking from room to room (with or without device)? Independent  Stairs: Did the patient need assistance with internal or external stairs (with or without device)? Independent  Functional Cognition: Did the patient need help planning regular tasks such as shopping or remembering to take medications? Independent  Current Functional Level Cognition  Overall Cognitive Status: Within Functional Limits for tasks assessed Orientation Level: Oriented X4 General Comments: WFL for basic info     Extremity Assessment (includes Sensation/Coordination)  Upper Extremity Assessment: LUE deficits/detail RUE Deficits / Details: Weaker grip than left but somewhat functional. 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  He demonstrates decreased control of Lt UE.  He reports he attempted to feed self with Lt  UE  LUE Coordination: decreased fine motor, decreased gross motor  Lower Extremity Assessment: Defer to PT evaluation LLE Deficits / Details: Grossly ~4/5 throughout.  LLE Sensation: WNL LLE Coordination: decreased fine motor, decreased gross motor    ADLs  Overall ADL's : Needs assistance/impaired Eating/Feeding: Set up, Sitting Grooming: Wash/dry hands, Wash/dry face, Oral care, Brushing hair, Set up, Supervision/safety, Sitting Upper Body Bathing: Moderate assistance, Sitting Lower Body Bathing: Moderate assistance, Sit to/from stand Upper Body Dressing : Moderate assistance, Sitting Lower Body Dressing: Maximal assistance, Sit to/from stand Toilet Transfer: Stand-pivot, BSC, Minimal assistance Toileting- Clothing Manipulation and Hygiene: Maximal assistance, Sit to/from stand Functional mobility during ADLs: Moderate assistance    Mobility  Overal bed mobility: Needs Assistance Bed Mobility: Supine to  Sit, Sit to Supine Supine to sit: Supervision, HOB elevated Sit to supine: Min assist General bed mobility comments: assist     Transfers  Overall transfer level: Needs assistance Equipment used: None Transfers: Sit to/from Stand, Stand Pivot Transfers Sit to Stand: Min guard, Min assist Stand pivot transfers: Min assist General transfer comment: Min guard to steady in standing. Stood from [employer].     Ambulation / Gait / Stairs / Wheelchair Mobility  Ambulation/Gait Ambulation/Gait assistance: Mod assist Ambulation Distance (Feet): 50 Feet Assistive device: 1 person hand held assist Gait Pattern/deviations: Step-to pattern, Step-through pattern, Narrow base of support, Decreased dorsiflexion - left, Steppage General Gait Details: Pt with decreased foot clearance LLE, drags it when fatigued; demonstrates steppage gait with cues to pick up LLE. Leans left. Some coordination difficulties noted as well. Cues to slow down. Gait velocity: decreased Gait velocity  interpretation: Below normal speed for age/gender    Posture / Balance Dynamic Sitting Balance Sitting balance - Comments: Good static sitting balance but LOB in all directions when trying to donn socks. Balance Overall balance assessment: Needs assistance Sitting-balance support: Feet supported, No upper extremity supported Sitting balance-Leahy Scale: Fair Sitting balance - Comments: Good static sitting balance but LOB in all directions when trying to donn socks. Standing balance support: Single extremity supported Standing balance-Leahy Scale: Fair Standing balance comment: Able to perform static standing with close Min guard, requires external support for dynamic standing.     Special needs/care consideration BiPAP/CPAP  N/a CPM  N/a Continuous Drip IV  N/a Dialysis  N/a Life Vest  N/a Oxygen  N/a Special Bed  N/a Trach Size  N/a Wound Vac n/a Skin intact Bowel mgmt: continent bladder mgmt: continent Diabetic mgmt n/a Burmese interpreter needed; pt limited [place] but understands more than he speaks per son; Son fluent English   Previous Home Environment Living Arrangements: (wife and son)  Lives With: Spouse, Son Available Help at Discharge: (wife and son works days) Type of Home: [place]: One level, Laundry or work area in [place]: Stairs to enter Entrance Stairs-Rails: Building surveyor of Steps: 3 Bathroom Shower/Tub: Chiropodist: Programmer, systems: Yes How Accessible: Accessible via [place]: No  Discharge [place] for Discharge Living Setting: Patient's home, Lives with (comment)(wife and adult son) Type of Home at Discharge: House Discharge [place]: One level, Laundry or work area in basement Discharge Home Access: Stairs to enter Entrance Stairs-Rails: Right Entrance Stairs-Number of Steps: 3 Discharge Bathroom Shower/Tub: Tub/shower unit Discharge  Bathroom Toilet: Standard Discharge Bathroom Accessibility: Yes How Accessible: Accessible via walker Does the patient have any problems obtaining your medications?: Yes (Describe)(uninsured)  Social/Family/Support Systems Patient Roles: Spouse, Parent(part time chef) Contact Information: son, Araceli Bouche, speaks fluent English Anticipated Caregiver: wife and son when not at work Anticipated Ambulance person Information: [phone] Ability/Limitations of Caregiver: wife and son work days Caregiver Availability: Evenings only Discharge Plan Discussed with Primary Caregiver: Yes Is Caregiver In Agreement with Plan?: Yes Does Caregiver/Family have Issues with Lodging/Transportation while Pt is in Rehab?: No(wife or son stay with pt in hospital when not at work)  Goals/Additional Needs Patient/Family Goal for Rehab: Mod I with PT and OT Expected length of stay: ELOS 10 to 14 days Cultural Considerations: Bur[place]ese; legal citizens now and been in [place] for 15 years Pt/Family Agrees to Admission and willing to participate: Yes Program Orientation Provided & Reviewed with Pt/Caregiver Including Roles  & Responsibilities: Yes  Decrease burden of Care through IP rehab admission: n/a  Possible need for SNF placement upon discharge:not anticipated  Patient Condition: This patient's condition remains as documented in the consult dated 10/02/2017, in which the Rehabilitation Physician determined and documented that the patient's condition is appropriate for intensive rehabilitative care in an inpatient rehabilitation facility. Will admit to inpatient rehab today.  Preadmission Screen Completed By:  Cleatrice Burke, 10/02/2017 4:41 PM ______________________________________________________________________   Discussed status with Dr. Letta Pate on 10/02/2017 at  1650 and received telephone approval for admission today.  Admission Coordinator:  Cleatrice Burke, time 0712 Date 10/02/2017              Cosigned by: Charlett Blake, MD at 10/02/2017 4:54 PM  Revision History

## 2017-10-02 NOTE — Care Management Note (Signed)
Case Management Note  Patient Details  Name: Clevon Khader MRN: [id number] Date of Birth: 09/16/1960  Subjective/Objective:                    Action/Plan: Pt discharging to CIR. CM met with the patient and his family about PCP. They would like to be set up with one of the [facility] when pt d/ced from CIR. No further needs per CM.  Expected Discharge Date:  10/02/17               Expected Discharge Plan:  [place]  In-House Referral:     Discharge planning Services  CM Consult  Post Acute Care Choice:    Choice offered to:     DME Arranged:    DME Agency:     HH Arranged:    HH Agency:     Status of Service:  Completed, signed off  If discussed at [employer], dates discussed:    Additional Comments:  Pollie Friar, RN 10/02/2017, 5:04 PM

## 2017-10-02 NOTE — Progress Notes (Signed)
[facility].  Agnes LawrenceE. Lindzen, MD notified regarding results.  No orders received.

## 2017-10-02 NOTE — Progress Notes (Signed)
Blood pressure noted to be 182/112 on manual check on admission. PA notified. No order given, communicated that target systolic BP is below 220. Reported to oncoming RN.

## 2017-10-02 NOTE — Progress Notes (Signed)
I met with pt, his wife, and son at bedside. Son speaks fluent [place] and interpreted. We discussed a possible inpt rehab admit as well as goals and expectations/ Son requesting to speak to financial counselor concerning Medicaid before pursuing admit for he is currently uninsured. I contacted Crystal at [phone], and she will speak to son today. I will  Follow up after that conversation. [phone]

## 2017-10-02 NOTE — Evaluation (Signed)
Physical Therapy Evaluation Patient Details Name: Willie Herrera MRN: [id number] DOB: 09/08/1960 Today's Date: 10/02/2017   History of Present Illness  Patient is a 57 y/o male who presents with left sided weakness and dysarthria. Head CT- acute/subacute infarct right caudate and anterior right insular cortex. MRI [facility]- acute infarct right posterior limb of internal capsule. PMH includes HTN.  Clinical Impression  Patient presents with left sided weakness, impaired balance and impaired mobility s/p above. Tolerated gait training with Mod A for balance/safety secondary to decreased foot clearance LLE, poor sequencing and left lateral lean. Difficulty using LUE to perform functional tasks- donning sock. Son interpreted during session as pt from [place]. 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 Lives with son. Will follow acutely to maximize independence and mobility prior to return home.     Follow Up Recommendations CIR    Equipment Recommendations  None recommended by PT    Recommendations for Other Services Rehab consult     Precautions / Restrictions Precautions Precautions: Fall Restrictions Weight Bearing Restrictions: No      Mobility  Bed Mobility Overal bed mobility: Needs Assistance Bed Mobility: Supine to Sit;Sit to Supine     Supine to sit: Supervision;HOB elevated Sit to supine: Supervision;HOB elevated   General bed mobility comments: Increased effort to get to EOB with impaired balance in all directions initially. Able to return to supine without difficulty.   Transfers Overall transfer level: Needs assistance Equipment used: None Transfers: Sit to/from Stand Sit to Stand: Min guard         General transfer comment: Min guard to steady in standing. Stood from [employer]state[employer]OB x1.   Ambulation/Gait Ambulation/Gait assistance: Mod assist Ambulation Distance (Feet): 50 Feet Assistive device: 1 person hand held assist Gait Pattern/deviations: Step-to  pattern;Step-through pattern;Narrow base of support;Decreased dorsiflexion - left;Steppage Gait velocity: decreased Gait velocity interpretation: Below normal speed for age/gender General Gait Details: Pt with decreased foot clearance LLE, drags it when fatigued; demonstrates steppage gait with cues to pick up LLE. Leans left. Some coordination difficulties noted as well. Cues to slow down.  Stairs            Wheelchair Mobility    Modified Rankin (Stroke Patients Only) Modified Rankin (Stroke Patients Only) Pre-Morbid Rankin Score: No symptoms Modified Rankin: Moderately severe disability     Balance Overall balance assessment: Needs assistance Sitting-balance support: Feet supported;No upper extremity supported Sitting balance-Leahy Scale: Poor Sitting balance - Comments: Good static sitting balance but LOB in all directions when trying to donn socks.   Standing balance support: During functional activity Standing balance-Leahy Scale: Fair Standing balance comment: Able to perform static standing with close Min guard, requires external support for dynamic standing.                              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      Hand Dominance   Dominant Hand: Right  Extremity/Trunk Assessment   Upper Extremity Assessment Upper Extremity Assessment: Defer to OT evaluation RUE Deficits / Details: Weaker grip than left but somewhat  functional. RUE Sensation: (WFL) RUE Coordination: decreased fine motor;decreased gross motor    Lower Extremity Assessment Lower Extremity Assessment: LLE deficits/detail LLE Deficits / Details: Grossly ~4/5 throughout.  LLE Sensation: WNL LLE Coordination: decreased fine motor;decreased gross motor    Cervical / Trunk Assessment Cervical / Trunk Assessment: Normal  Communication   Communication: Prefers language other than English(Son interpreted. )  Cognition Arousal/Alertness: Awake/alert Behavior During Therapy: WFL for tasks assessed/performed Overall Cognitive Status: Within Functional Limits for tasks assessed                                 General Comments: A&Ox4 and able to follow simple 1-2 step commands. Difficult to fully assess due to language.      General Comments General comments (skin integrity, edema, etc.): Family present during session. Son works during the day.    Exercises     Assessment/Plan    PT Assessment Patient needs continued PT services  PT Problem List Decreased strength;Decreased mobility;Decreased coordination;Decreased balance       PT Treatment Interventions Functional mobility training;Balance training;Patient/family education;Gait training;Therapeutic activities;Therapeutic exercise;Stair training;Neuromuscular re-education;DME instruction    PT Goals (Current goals can be found in the Care Plan section)  Acute Rehab PT Goals Patient Stated Goal: to get back to independence PT Goal Formulation: With patient/family Time For Goal Achievement: 10/16/17 Potential to Achieve Goals: Good    Frequency Min 4X/week   Barriers to discharge Decreased caregiver support      Co-evaluation               AM-PAC PT "6 Clicks" Daily Activity  Outcome Measure Difficulty turning over in bed (including adjusting bedclothes, sheets and blankets)?: None Difficulty moving from lying on back to sitting on the side of the bed?  : None Difficulty sitting down on and standing up from a chair with arms (e.g., wheelchair, bedside commode, etc,.)?: A Little Help needed moving to and from a bed to chair (including a wheelchair)?: A Little Help needed walking in hospital room?: A Lot Help needed climbing 3-5 steps with a railing? : A Little 6 Click Score: 19    End of Session Equipment Utilized During Treatment: Gait belt Activity Tolerance: Patient tolerated treatment well Patient left: in bed;with call bell/phone within reach;with bed alarm set;with family/visitor present Nurse Communication: Mobility status PT Visit Diagnosis: Hemiplegia and hemiparesis;Difficulty in walking, not elsewhere classified (R26.2);Unsteadiness on feet (R26.81) Hemiplegia - Right/Left: Left Hemiplegia - dominant/non-dominant: Non-dominant Hemiplegia - caused by: Cerebral infarction    Time: 1010-1033 PT Time Calculation (min) (ACUTE ONLY): 23 min   Charges:   PT Evaluation $PT Eval Moderate Complexity: 1 Mod PT Treatments $Gait Training: 8-22 mins   PT G Codes:        Willie Herrera, PT, DPT [phone]    Willie Herrera 10/02/2017, 10:45 AM

## 2017-10-02 NOTE — Plan of Care (Signed)
  Education: Knowledge of disease or condition will improve 10/02/2017 0215 - Progressing by Estrella DeedsBloodworth, Yusuke Beza L, RN Knowledge of secondary prevention will improve 10/02/2017 0215 - Progressing by Estrella DeedsBloodworth, Shanena Pellegrino L, RN Knowledge of patient specific risk factors addressed and post discharge goals established will improve 10/02/2017 0215 - Progressing by Estrella DeedsBloodworth, Daiki Dicostanzo L, RN   Self-Care: Ability to participate in self-care as condition permits will improve 10/02/2017 0215 - Progressing by Estrella DeedsBloodworth, Reynolds Kittel L, RN   Ischemic Stroke/TIA Tissue Perfusion: Complications of ischemic stroke/TIA will be minimized 10/02/2017 0215 - Progressing by Moani Weipert, Candyce ChurnKatie L, RN

## 2017-10-02 NOTE — Progress Notes (Addendum)
Patient arrived to unit and oriented. Translation by son. Visiting with family. No complaints of pain, no distress. Patient and family verbalized understanding to call staff for needs. Eating dinner.

## 2017-10-02 NOTE — Consult Note (Signed)
Physical Medicine and Rehabilitation Consult Reason for Consult: Left-sided weakness and dysarthria Referring Physician: [facility]   HPI: Willie Herrera is a 57 y.o. right handed male with history of hypertension and tobacco abuse on no prescription medications. He has not seen a doctor in 2-3 years. Per chart review patient lives with his family. 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 Family works during the day. One level home 3 steps to entry. Presented 10/01/2017 with left-sided weakness/fall and slurred speech. Patient hypertensive 200/120. EKG sinus rhythm with diffuse ST-ST abnormalities and no prior comparison. Troponin negative. Cranial CT scan showed subtle hypoattenuation involving the right caudate head and anterior right insular cortex concerning for acute subacute ischemia. CT a of the head and neck negative for emergent large vessel occlusion. UDS negative. Patient did not receive TPA. MRI showed small acute early subacute infarct within right posterior limb of internal capsule. No hemorrhage or mass effect. Currently on aspirin for CVA prophylaxis. Subcutaneous Lovenox for DVT prophylaxis. Echocardiogram showed LVH no valvular disease, no Premo.   Review of Systems  Constitutional: Negative for chills and fever.  HENT: Negative for hearing loss.   Eyes: Negative for blurred vision and double vision.  Respiratory: Negative for shortness of breath.   Cardiovascular: Negative for chest pain, palpitations and leg swelling.  Gastrointestinal: Positive for constipation and nausea. Negative for vomiting.  Genitourinary: Negative for dysuria, flank pain and hematuria.  Musculoskeletal: Positive for myalgias.  Skin: Negative for rash.  Neurological: Positive for speech change, focal weakness and headaches.  All other systems reviewed and are negative.  Past Medical History:  Diagnosis Date  . HTN (hypertension) 08/25/2014   History  reviewed. No pertinent surgical history. Family History  Problem Relation Age of Onset  . Hypertension Mother   . Stroke Maternal Grandfather    Social History:  reports that he has been smoking.  he has never used smokeless tobacco. He reports that he drinks alcohol. He reports that he does not use drugs. Allergies: No Known Allergies No medications prior to admission.    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  Functional Status:  Mobility: Bed Mobility Overal bed mobility: Needs Assistance Bed Mobility: Supine to Sit, Sit to Supine Supine to sit: Supervision, HOB elevated Sit to supine: Supervision, HOB elevated General bed mobility comments: Increased effort to get to EOB with impaired balance in all directions initially. Able to return to supine without difficulty.  Transfers Overall transfer level: Needs assistance Equipment used: None Transfers: Sit to/from Stand Sit to Stand: Min guard General transfer comment: Min guard to steady in standing. Stood from [employer].  Ambulation/Gait Ambulation/Gait assistance: Mod assist Ambulation Distance (Feet): 50 Feet Assistive device: 1 person hand held assist Gait Pattern/deviations: Step-to pattern, Step-through pattern, Narrow base of support, Decreased dorsiflexion - left, Steppage General Gait Details: Pt with decreased foot clearance LLE, drags it when fatigued; demonstrates steppage gait with cues to pick up LLE. Leans left. Some  coordination difficulties noted as well. Cues to slow down. Gait velocity:  decreased Gait velocity interpretation: Below normal speed for age/gender    ADL:    Cognition: Cognition Overall Cognitive Status: Within Functional Limits for tasks assessed Orientation Level: Oriented X4 Cognition Arousal/Alertness: Awake/alert Behavior During Therapy: WFL for tasks assessed/performed Overall Cognitive Status: Within Functional Limits for tasks assessed General Comments: A&Ox4 and able to follow simple 1-2 step commands. Difficult to fully assess due to language.  Blood pressure (!) 171/106, pulse 74, temperature 98.1 F (36.7 C), temperature source Oral, resp. rate 16, height 5\' 4"  (1.626 m), weight 82.6 kg (182 lb), SpO2 96 %. Physical Exam  Constitutional:  57 year old right-handed male sitting up in bed  HENT:  Mild left facial droop  Eyes:  Pupils reactive to light  Neck: Normal range of motion. Neck supple. No thyromegaly present.  Cardiovascular: Normal rate, regular rhythm and normal heart sounds.  Respiratory: Effort normal and breath sounds normal. No respiratory distress.  GI: Soft. Bowel sounds are normal. He exhibits no distension.  Neurological: He is alert.  Patient speaks English but on a limited basis. He could provide his name and age. Provides date of birth. Follows simple commands  Motor strength is 5/5 in the right deltoid, bicep, tricep, grip, hip flexor, knee extensor, ankle dorsiflexor 3+ left deltoid, bicep, tricep, grip 4- left hip flexor knee extensor ankle dorsiflexor Sensation intact light touch bilateral upper and lower limbs  Results for orders placed or performed during the hospital encounter of 10/01/17 (from the past 24 hour(s))  CBG monitoring, ED     Status: None   Collection Time: 10/01/17  1:54 PM  Result Value Ref Range   Glucose-Capillary 97 65 - 99 mg/dL  Ethanol     Status: None   Collection Time: 10/01/17  2:15 PM  Result Value Ref Range    Alcohol, Ethyl (B) <10 <10 mg/dL  Protime-INR     Status: None   Collection Time: 10/01/17  2:15 PM  Result Value Ref Range   Prothrombin Time 12.4 11.4 - 15.2 seconds   INR 0.93   APTT     Status: None   Collection Time: 10/01/17  2:15 PM  Result Value Ref Range   aPTT 30 24 - 36 seconds  CBC     Status: Abnormal   Collection Time: 10/01/17  2:15 PM  Result Value Ref Range   WBC 9.1 4.0 - 10.5 K/uL   RBC 5.78 4.22 - 5.81 MIL/uL   Hemoglobin 14.9 13.0 - 17.0 g/dL   HCT 16.1 09.6 - 04.5 %   MCV 73.5 (L) 78.0 - 100.0 fL   MCH 25.8 (L) 26.0 - 34.0 pg   MCHC 35.1 30.0 - 36.0 g/dL   RDW 40.9 81.1 - 91.4 %   Platelets 228 150 - 400 K/uL  Differential     Status: None   Collection Time: 10/01/17  2:15 PM  Result Value Ref Range   Neutrophils Relative % 58 %   Lymphocytes Relative 29 %   Monocytes Relative 11 %   Eosinophils Relative 2 %   Basophils Relative 0 %   Neutro Abs 5.3 1.7 - 7.7 K/uL   Lymphs Abs 2.6 0.7 - 4.0 K/uL   Monocytes Absolute 1.0 0.1 - 1.0 K/uL   Eosinophils Absolute 0.2 0.0 - 0.7 K/uL   Basophils Absolute 0.0 0.0 - 0.1 K/uL   RBC Morphology TARGET CELLS   Comprehensive metabolic panel     Status: Abnormal   Collection Time: 10/01/17  2:15 PM  Result Value  Ref Range   Sodium 133 (L) 135 - 145 mmol/L   Potassium 3.5 3.5 - 5.1 mmol/L   Chloride 100 (L) 101 - 111 mmol/L   CO2 22 22 - 32 mmol/L   Glucose, Bld 104 (H) 65 - 99 mg/dL   BUN 15 6 - 20 mg/dL   Creatinine, Ser 1.61 0.61 - 1.24 mg/dL   Calcium 8.8 (L) 8.9 - 10.3 mg/dL   Total Protein 8.6 (H) 6.5 - 8.1 g/dL   Albumin 4.2 3.5 - 5.0 g/dL   AST 32 15 - 41 U/L   ALT 29 17 - 63 U/L   Alkaline Phosphatase 71 38 - 126 U/L   Total Bilirubin 0.7 0.3 - 1.2 mg/dL   GFR calc non Af Amer >60 >60 mL/min   GFR calc Af Amer >60 >60 mL/min   Anion gap 11 5 - 15  Troponin I     Status: None   Collection Time: 10/01/17  2:15 PM  Result Value Ref Range   Troponin I <0.03 <0.03 ng/mL  CBG monitoring, ED      Status: Abnormal   Collection Time: 10/01/17  2:16 PM  Result Value Ref Range   Glucose-Capillary 100 (H) 65 - 99 mg/dL   Comment 1 Notify RN    Comment 2 Document in Chart   Urine rapid drug screen (hosp performed)     Status: None   Collection Time: 10/01/17  4:00 PM  Result Value Ref Range   Opiates NONE DETECTED NONE DETECTED   Cocaine NONE DETECTED NONE DETECTED   Benzodiazepines NONE DETECTED NONE DETECTED   Amphetamines NONE DETECTED NONE DETECTED   Tetrahydrocannabinol NONE DETECTED NONE DETECTED   Barbiturates NONE DETECTED NONE DETECTED  Urinalysis, Routine w reflex microscopic     Status: Abnormal   Collection Time: 10/01/17  4:00 PM  Result Value Ref Range   Color, Urine YELLOW YELLOW   APPearance CLEAR CLEAR   Specific Gravity, Urine 1.010 1.005 - 1.030   pH 7.0 5.0 - 8.0   Glucose, UA NEGATIVE NEGATIVE mg/dL   Hgb urine dipstick NEGATIVE NEGATIVE   Bilirubin Urine NEGATIVE NEGATIVE   Ketones, ur NEGATIVE NEGATIVE mg/dL   Protein, ur 30 (A) NEGATIVE mg/dL   Nitrite NEGATIVE NEGATIVE   Leukocytes, UA NEGATIVE NEGATIVE  Urinalysis, Microscopic (reflex)     Status: Abnormal   Collection Time: 10/01/17  4:00 PM  Result Value Ref Range   RBC / HPF NONE SEEN 0 - 5 RBC/hpf   WBC, UA NONE SEEN 0 - 5 WBC/hpf   Bacteria, UA RARE (A) NONE SEEN   Squamous Epithelial / LPF 0-5 (A) NONE SEEN  Hemoglobin A1c     Status: Abnormal   Collection Time: 10/02/17  4:58 AM  Result Value Ref Range   Hgb A1c MFr Bld 6.2 (H) 4.8 - 5.6 %   Mean Plasma Glucose 131.24 mg/dL  Lipid panel     Status: Abnormal   Collection Time: 10/02/17  4:58 AM  Result Value Ref Range   Cholesterol 232 (H) 0 - 200 mg/dL   Triglycerides 096 <045 mg/dL   HDL 69 >40 mg/dL   Total CHOL/HDL Ratio 3.4 RATIO   VLDL 21 0 - 40 mg/dL   LDL Cholesterol 981 (H) 0 - 99 mg/dL  CBC     Status: Abnormal   Collection Time: 10/02/17  4:58 AM  Result Value Ref Range   WBC 8.8 4.0 - 10.5 K/uL   RBC 5.60 4.22 -  5.81  MIL/uL   Hemoglobin 14.7 13.0 - 17.0 g/dL   HCT 16.1 09.6 - 04.5 %   MCV 76.8 (L) 78.0 - 100.0 fL   MCH 26.3 26.0 - 34.0 pg   MCHC 34.2 30.0 - 36.0 g/dL   RDW 40.9 81.1 - 91.4 %   Platelets 211 150 - 400 K/uL   Ct Angio Head W Or Wo Contrast  Result Date: 10/01/2017 CLINICAL DATA:  57 year old male with left extremity weakness beginning at 0200 hours today. EXAM: CT ANGIOGRAPHY HEAD AND NECK TECHNIQUE: Multidetector CT imaging of the head and neck was performed using the standard protocol during bolus administration of intravenous contrast. Multiplanar CT image reconstructions and MIPs were obtained to evaluate the vascular anatomy. Carotid stenosis measurements (when applicable) are obtained utilizing NASCET criteria, using the distal internal carotid diameter as the denominator. CONTRAST:  ISOVUE-370 IOPAMIDOL (ISOVUE-370) INJECTION 76% COMPARISON:  Head CT without contrast 1437 hours today. FINDINGS: CTA NECK Skeleton: No acute osseous abnormality identified. Cervical spine disc, endplate, and facet degeneration. Upper chest: Bilateral upper lung atelectasis. No superior mediastinal lymphadenopathy. Other neck: Negative; partially retropharyngeal course of both carotids-more so the left. No neck mass or lymphadenopathy. Aortic arch: 3 vessel arch configuration with mild arch atherosclerosis distal to the left subclavian origin. No great vessel origin atherosclerosis or stenosis. Right carotid system: Negative aside from mild tortuosity. Widely patent right carotid bifurcation. Left carotid system: Negative aside from tortuosity. Widely patent left carotid bifurcation. Vertebral arteries: Normal proximal right subclavian artery. The right vertebral artery is non dominant and diminutive but patent from its origin to the skull base without stenosis identified. Normal proximal left subclavian artery. The left vertebral artery origin is normal (series 8, image 171). The left vertebral is dominant. The  left V1 segment is tortuous with a kinked appearance, but there is otherwise no cervical left vertebral artery stenosis. CTA HEAD Posterior circulation: Dominant distal left vertebral artery primarily supplies the basilar. The distal right vertebral artery functionally terminates in PICA. Normal left PICA origin. Patent basilar artery without stenosis. Normal SCA origins (duplicated on the right). There are fetal type bilateral PCA origins. Bilateral PCA branches are within normal limits. Anterior circulation: Both ICA siphons are patent. There is mild to moderate bilateral ICA siphon calcified plaque. No hemodynamically significant stenosis occurs on the left. Mild if any right ICA siphon stenosis in the cavernous and supraclinoid segments. The bilateral ophthalmic and posterior communicating artery origins are normal. Patent carotid termini. Normal MCA and ACA origins. Anterior communicating artery is diminutive or absent. Bilateral ACA branches are within normal limits. Left MCA M1 segment and bifurcation are patent. There is mild irregularity and stenosis at the dominant posterior left M2 origin best seen on series 11, image 19. Left MCA branches otherwise are within normal limits. The right MCA M1 segment and bifurcation are patent and appear normal. Right MCA branches are within normal limits. Venous sinuses: Patent. Anatomic variants: Dominant left vertebral artery, the right terminates in PICA. Fetal type bilateral PCA origins. Delayed phase: Age indeterminate hypodensity re-demonstrated at the head of the caudate nucleus (series 13, image 15). Hypodensity also along the anterior limb of the left external capsule appears stable. No cortically based acute infarct identified. No abnormal enhancement identified. Review of the MIP images confirms the above findings IMPRESSION: 1. Negative for emergent large vessel occlusion. No hemodynamically significant arterial stenosis in the head or neck. 2. No significant  extracranial atherosclerosis. Mild to moderate intracranial atherosclerosis most apparent  at the ICA siphons and left MCA bifurcation. 3. Stable CT appearance of the brain from 1437 hours today. Age indeterminate small vessel ischemia in the right basal ganglia and left deep white matter capsules. Electronically Signed   By: Odessa Fleming M.D.   On: 10/01/2017 16:09   Dg Chest 2 View  Result Date: 10/01/2017 CLINICAL DATA:  57 year old male with left side weakness beginning at 0200 hours. EXAM: CHEST  2 VIEW COMPARISON:  Chest radiographs 05/12/2015. FINDINGS: AP and lateral views of the chest. Lung volumes are stable and within normal limits. Mediastinal contours remain normal. Visualized tracheal air column is within normal limits. No pneumothorax, pleural effusion or confluent pulmonary opacity. There is bilateral pulmonary vascular congestion, but no overt edema. No acute osseous abnormality identified. Negative visible bowel gas pattern. IMPRESSION: Mild pulmonary vascular congestion without overt edema. No other acute findings. Electronically Signed   By: Odessa Fleming M.D.   On: 10/01/2017 15:16   Ct Angio Neck W And/or Wo Contrast  Result Date: 10/01/2017 CLINICAL DATA:  57 year old male with left extremity weakness beginning at 0200 hours today. EXAM: CT ANGIOGRAPHY HEAD AND NECK TECHNIQUE: Multidetector CT imaging of the head and neck was performed using the standard protocol during bolus administration of intravenous contrast. Multiplanar CT image reconstructions and MIPs were obtained to evaluate the vascular anatomy. Carotid stenosis measurements (when applicable) are obtained utilizing NASCET criteria, using the distal internal carotid diameter as the denominator. CONTRAST:  ISOVUE-370 IOPAMIDOL (ISOVUE-370) INJECTION 76% COMPARISON:  Head CT without contrast 1437 hours today. FINDINGS: CTA NECK Skeleton: No acute osseous abnormality identified. Cervical spine disc, endplate, and facet degeneration.  Upper chest: Bilateral upper lung atelectasis. No superior mediastinal lymphadenopathy. Other neck: Negative; partially retropharyngeal course of both carotids-more so the left. No neck mass or lymphadenopathy. Aortic arch: 3 vessel arch configuration with mild arch atherosclerosis distal to the left subclavian origin. No great vessel origin atherosclerosis or stenosis. Right carotid system: Negative aside from mild tortuosity. Widely patent right carotid bifurcation. Left carotid system: Negative aside from tortuosity. Widely patent left carotid bifurcation. Vertebral arteries: Normal proximal right subclavian artery. The right vertebral artery is non dominant and diminutive but patent from its origin to the skull base without stenosis identified. Normal proximal left subclavian artery. The left vertebral artery origin is normal (series 8, image 171). The left vertebral is dominant. The left V1 segment is tortuous with a kinked appearance, but there is otherwise no cervical left vertebral artery stenosis. CTA HEAD Posterior circulation: Dominant distal left vertebral artery primarily supplies the basilar. The distal right vertebral artery functionally terminates in PICA. Normal left PICA origin. Patent basilar artery without stenosis. Normal SCA origins (duplicated on the right). There are fetal type bilateral PCA origins. Bilateral PCA branches are within normal limits. Anterior circulation: Both ICA siphons are patent. There is mild to moderate bilateral ICA siphon calcified plaque. No hemodynamically significant stenosis occurs on the left. Mild if any right ICA siphon stenosis in the cavernous and supraclinoid segments. The bilateral ophthalmic and posterior communicating artery origins are normal. Patent carotid termini. Normal MCA and ACA origins. Anterior communicating artery is diminutive or absent. Bilateral ACA branches are within normal limits. Left MCA M1 segment and bifurcation are patent. There is  mild irregularity and stenosis at the dominant posterior left M2 origin best seen on series 11, image 19. Left MCA branches otherwise are within normal limits. The right MCA M1 segment and bifurcation are patent and appear normal. Right  MCA branches are within normal limits. Venous sinuses: Patent. Anatomic variants: Dominant left vertebral artery, the right terminates in PICA. Fetal type bilateral PCA origins. Delayed phase: Age indeterminate hypodensity re-demonstrated at the head of the caudate nucleus (series 13, image 15). Hypodensity also along the anterior limb of the left external capsule appears stable. No cortically based acute infarct identified. No abnormal enhancement identified. Review of the MIP images confirms the above findings IMPRESSION: 1. Negative for emergent large vessel occlusion. No hemodynamically significant arterial stenosis in the head or neck. 2. No significant extracranial atherosclerosis. Mild to moderate intracranial atherosclerosis most apparent at the ICA siphons and left MCA bifurcation. 3. Stable CT appearance of the brain from 1437 hours today. Age indeterminate small vessel ischemia in the right basal ganglia and left deep white matter capsules. Electronically Signed   By: Odessa FlemingH  Hall M.D.   On: 10/01/2017 16:09   Mr Brain Wo Contrast  Result Date: 10/02/2017 CLINICAL DATA:  57 y/o  M; weakness involving left leg and left arm. EXAM: MRI HEAD WITHOUT CONTRAST MRA HEAD WITHOUT CONTRAST TECHNIQUE: Multiplanar, multiecho pulse sequences of the brain and surrounding structures were obtained without intravenous contrast. Angiographic images of the head were obtained using MRA technique without contrast. COMPARISON:  10/01/2017 CT head and CT angiogram head. FINDINGS: MRI HEAD FINDINGS Brain: 1.7 x 0.7 x 0.6 cm (volume = 0.4 cm^3) focus of reduced diffusion in the right posterior limb of internal capsule compatible with acute/early subacute infarction. No hemorrhage or mass effect.  Small chronic lacunar infarcts are present within bilateral putamen and right caudate head. Fewnonspecific foci of T2 FLAIR hyperintense signal abnormality in subcortical and periventricular white matter are compatible withmildchronic microvascular ischemic changes for age. Mildbrain parenchymal volume loss. No hydrocephalus, extra-axial collection, or effacement of basilar cisterns. Vascular: As below. Skull and upper cervical spine: Normal marrow signal. Sinuses/Orbits: Negative. Other: None. MRA HEAD FINDINGS Internal carotid arteries: Patent. Irregularity of bilateral carotid siphons compatible with atherosclerosis. Mild proximal right cavernous stenosis. Anterior cerebral arteries:  Patent. Middle cerebral arteries: Patent. Left M2 inferior division origin mild stenosis. Anterior communicating artery: Not identified, likely hypoplastic or absent. Posterior communicating arteries:  Patent.  Bilateral fetal PCA. Posterior cerebral arteries:  Patent. Basilar artery:  Patent. Vertebral arteries: Patent. Diminutive right vertebral artery terminates in right PICA. No evidence of high-grade stenosis, large vessel occlusion, or aneurysm identified. IMPRESSION: 1. Small acute/early subacute infarction within right posterior limb of internal capsule. No hemorrhage or mass effect. 2. Mild chronic microvascular ischemic changes and parenchymal volume loss of the brain. Small chronic lacunar infarctions within the basal ganglia. 3. Patent anterior and posterior intracranial circulation. No large vessel occlusion, aneurysm, or significant stenosis. These results will be called to the ordering clinician or representative by the Radiologist Assistant, and communication documented in the PACS or zVision Dashboard. Electronically Signed   By: Mitzi HansenLance  Furusawa-Stratton M.D.   On: 10/02/2017 03:15   Mr Maxine GlennMra Head Wo Contrast  Result Date: 10/02/2017 CLINICAL DATA:  57 y/o  M; weakness involving left leg and left arm. EXAM: MRI  HEAD WITHOUT CONTRAST MRA HEAD WITHOUT CONTRAST TECHNIQUE: Multiplanar, multiecho pulse sequences of the brain and surrounding structures were obtained without intravenous contrast. Angiographic images of the head were obtained using MRA technique without contrast. COMPARISON:  10/01/2017 CT head and CT angiogram head. FINDINGS: MRI HEAD FINDINGS Brain: 1.7 x 0.7 x 0.6 cm (volume = 0.4 cm^3) focus of reduced diffusion in the right posterior limb of internal capsule  compatible with acute/early subacute infarction. No hemorrhage or mass effect. Small chronic lacunar infarcts are present within bilateral putamen and right caudate head. Fewnonspecific foci of T2 FLAIR hyperintense signal abnormality in subcortical and periventricular white matter are compatible withmildchronic microvascular ischemic changes for age. Mildbrain parenchymal volume loss. No hydrocephalus, extra-axial collection, or effacement of basilar cisterns. Vascular: As below. Skull and upper cervical spine: Normal marrow signal. Sinuses/Orbits: Negative. Other: None. MRA HEAD FINDINGS Internal carotid arteries: Patent. Irregularity of bilateral carotid siphons compatible with atherosclerosis. Mild proximal right cavernous stenosis. Anterior cerebral arteries:  Patent. Middle cerebral arteries: Patent. Left M2 inferior division origin mild stenosis. Anterior communicating artery: Not identified, likely hypoplastic or absent. Posterior communicating arteries:  Patent.  Bilateral fetal PCA. Posterior cerebral arteries:  Patent. Basilar artery:  Patent. Vertebral arteries: Patent. Diminutive right vertebral artery terminates in right PICA. No evidence of high-grade stenosis, large vessel occlusion, or aneurysm identified. IMPRESSION: 1. Small acute/early subacute infarction within right posterior limb of internal capsule. No hemorrhage or mass effect. 2. Mild chronic microvascular ischemic changes and parenchymal volume loss of the brain. Small chronic  lacunar infarctions within the basal ganglia. 3. Patent anterior and posterior intracranial circulation. No large vessel occlusion, aneurysm, or significant stenosis. These results will be called to the ordering clinician or representative by the Radiologist Assistant, and communication documented in the PACS or zVision Dashboard. Electronically Signed   By: Mitzi Hansen M.D.   On: 10/02/2017 03:15   Ct Head Code Stroke Wo Contrast  Result Date: 10/01/2017 CLINICAL DATA:  Code stroke. Acute onset of left upper and lower extremity weakness beginning at 2 a.m. today, 12 hours ago. EXAM: CT HEAD WITHOUT CONTRAST TECHNIQUE: Contiguous axial images were obtained from the base of the skull through the vertex without intravenous contrast. COMPARISON:  None. FINDINGS: Brain: Subtle hypoattenuation is present in the right caudate head and anterior right insular cortex. Asymmetric white matter hypoattenuation is present in the left external capsule. Other scattered subcortical white matter hypoattenuation is present. No other focal cortical abnormality is present. No acute hemorrhage or mass lesion is present. The ventricles are of normal size. No significant extra-axial fluid collection is present. Vascular: Atherosclerotic calcifications are present within the cavernous internal carotid arteries bilaterally. There is no hyperdense vessel. : Calvarium is intact. No focal lytic or blastic lesions are present. Sinuses/Orbits: The paranasal sinuses and mastoid air cells are clear. Visualized globes and orbits are within normal limits. Other: ASPECTS (Alber[place] Stroke Program Early CT Score) - Ganglionic level infarction (caudate, lentiform nuclei, internal capsule, insula, M1-M3 cortex): 5/7 - Supraganglionic infarction (M4-M6 cortex): 3/3 Total score (0-10 with 10 being normal): 10/10 IMPRESSION: 1. Subtle hypoattenuation involving the right caudate head and anterior right insular cortex is concerning for  acute/subacute ischemia. 2. Mild white matter disease otherwise. This likely reflects the sequela of chronic microvascular ischemia. 3. ASPECTS is 8/10 These results were called by telephone at the time of interpretation on 10/01/2017 at 2:40 pm to Dr. Doug Sou , who verbally acknowledged these results. Electronically Signed   By: Marin Roberts M.D.   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 Does the need for close, 24 hr/day medical supervision in concert with the patient's rehab needs make it unreasonable for this patient to be served in a less intensive setting? Yes 2. Co-Morbidities requiring supervision/potential complications: Hypertension, history of tobacco abuse 3. Due to bladder management, bowel management, safety,  skin/wound care, disease management, medication administration, pain management and patient education, does the patient require 24 hr/day rehab nursing? Yes 4. Does the patient require coordinated care of a physician, rehab nurse, PT (1-2 hrs/day, 5 days/week) and OT (1-2 hrs/day, 5 days/week) to address physical and functional deficits in the context of the above medical diagnosis(es)? Yes Addressing deficits in the following areas: balance, endurance, locomotion, strength, transferring, bowel/bladder control, bathing, dressing, feeding, grooming, toileting and psychosocial support 5. Can the patient actively participate in an intensive therapy program of at least 3 hrs of therapy per day at least 5 days per week? Yes 6. The potential for patient to make measurable gains while on inpatient rehab is excellent 7. Anticipated functional outcomes upon discharge from inpatient rehab are modified independent  with PT, modified independent with OT, n/a with SLP. 8. Estimated rehab length of stay to reach the above functional goals is: 10-14d 9. Anticipated D/C setting:  Home 10. Anticipated post D/C treatments: HH therapy 11. Overall Rehab/Functional Prognosis: excellent  RECOMMENDATIONS: This patient's condition is appropriate for continued rehabilitative care in the following setting: CIR Patient has agreed to participate in recommended program. Family checking into options given no insurance Note that insurance prior authorization may be required for reimbursement for recommended care.  Comment:   Erick Colace M.D. [facility] FAAPM&R (Sports Med, Neuromuscular Med) Diplomate Am Board of Electrodiagnostic Med  Lynnae Prude 10/02/2017

## 2017-10-02 NOTE — Progress Notes (Signed)
BP 183/125; labetalol prn given.  Pt verbalizes no complaints of headache or other pain. MD notified.

## 2017-10-02 NOTE — Plan of Care (Signed)
  Progressing Consults RH STROKE PATIENT EDUCATION Description See Patient Education module for education specifics  10/02/2017 2346 - Progressing by Martina SinnerMurray, Aren Cherne A, RN RH SAFETY RH STG ADHERE TO SAFETY PRECAUTIONS W/ASSISTANCE/DEVICE Description STG Adhere to Safety Precautions With Mod I Assistance/Device.  10/02/2017 2346 - Progressing by Martina SinnerMurray, Lovinia Snare A, RN RH COGNITION-NURSING RH STG ANTICIPATES NEEDS/CALLS FOR ASSIST W/ASSIST/CUES Description STG Anticipates Needs/Calls for Assist With Mod I Assistance/Cues.  10/02/2017 2346 - Progressing by Martina SinnerMurray, Alvis Pulcini A, RN RH KNOWLEDGE DEFICIT RH STG INCREASE KNOWLEDGE OF HYPERTENSION Description Pt will be able to verbalize 2 ways to manage blood pressure at the time of discharge with min cues.   10/02/2017 2346 - Progressing by Martina SinnerMurray, Layni Kreamer A, RN

## 2017-10-02 NOTE — Progress Notes (Signed)
Rehab Admissions Coordinator Note:  Patient was screened by Clois DupesBoyette, [place] Mennen Godwin for appropriateness for an Inpatient Acute Rehab Consult per PT recommendation.,   At this time, we are recommending Inpatient Rehab consult.  Clois DupesBoyette, [place] Robello Godwin 10/02/2017, 11:05 AM  I can be reached at [phone].

## 2017-10-02 NOTE — Discharge Summary (Signed)
PATIENT DETAILS Name: Gradyn Shein Age: 57 y.o. Sex: male Date of Birth: Oct 22, 1960 MRN: [id number]. Admitting Physician: Briscoe Deutscher, MD NWG:[id number], Kateri Mc  Admit Date: 10/01/2017 Discharge date: 10/02/2017  Recommendations for Outpatient Follow-up:  1. Follow up with PCP in 1-2 weeks 2. Initiate antihypertensive in the next 1-2 days. 3. Ensure outpatient follow-up with neurology. 4. Repeat lipid panel in 3 months  Admitted From:  Home  Disposition: CIR   Home Health: No  Equipment/Devices: None  Discharge Condition: Stable  CODE STATUS: FULL CODE  Diet recommendation:  Heart Healthy   Brief Summary: See H&P, Labs, Consult and Test reports for all details in brief, Patient is a 57 y.o. male with no prior significant past medical history-presented to the hospital after sustaining a mechanical fall at night, the next morning when he woke up he was found to have left-sided weakness.  He was admitted to the hospitalist service for further workup, MRI of the brain confirms acute CVA.  See below for further details  Brief Hospital Course: Acute CVA: Very mild left-sided deficits-MRI brain confirms a small acute/early subacute infarction in the posterior limb of the right internal capsule.  Suspec small vessel disease as the culprit etiology for CVA.  Echocardiogram with preserved EF, with no evidence of embolic CVA.  CT angiogram of the neck did not show any major stenosis.   LDL 142-has been started on high intensity statin.  A1c 6.2-we will advise diet and exercise regimen.  Continue aspirin.    Current recommendations from rehab services are to discharge to CIR.  No further recommendations from stroke MD.  Hypertension: Allow permissive hypertension- slowly initiate antihypertensives in the next day or so.  Procedures/Studies: None  Discharge Diagnoses:  Principal Problem:   Ischemic stroke [facility]) Active Problems:   Smoker   Hypertensive urgency   Alcohol  abuse   Hyperlipidemia   Discharge Instructions:  Activity:  As tolerated  Discharge Instructions    Ambulatory referral to Neurology   Complete by:  As directed    An appointment is requested in approximately: 4 weeks Follow up with stroke clinic NP (Jessica Vanschaick or Darrol Angel, if both not available, consider Manson Allan, or Ahern) at [facility] in about 4 weeks. Thanks.   Diet - low sodium heart healthy   Complete by:  As directed    Increase activity slowly   Complete by:  As directed      Allergies as of 10/02/2017   No Known Allergies     Medication List    STOP taking these medications   acetaminophen 325 MG tablet Commonly known as:  TYLENOL   aspirin EC 325 MG tablet Replaced by:  aspirin 325 MG tablet   PROBIOTIC-10 PO     TAKE these medications   aspirin 325 MG tablet Take 1 tablet (325 mg total) by mouth daily. Start taking on:  10/03/2017 Replaces:  aspirin EC 325 MG tablet   atorvastatin 40 MG tablet Commonly known as:  LIPITOR Take 1 tablet (40 mg total) by mouth daily at 6 PM.      Follow-up Information    George Hugh, NP. Schedule an appointment as soon as possible for a visit in 4 week(s).   Specialty:  Nurse Practitioner Contact information: [address] [place] [address] [phone]          No Known Allergies  Consultations:   neurology and CIR   Other Procedures/Studies: Ct Angio Head W Or Wo Contrast  Result Date: 10/01/2017 CLINICAL DATA:  57 year old male with left extremity weakness beginning at 0200 hours today. EXAM: CT ANGIOGRAPHY HEAD AND NECK TECHNIQUE: Multidetector CT imaging of the head and neck was performed using the standard protocol during bolus administration of intravenous contrast. Multiplanar CT image reconstructions and MIPs were obtained to evaluate the vascular anatomy. Carotid stenosis measurements (when applicable) are obtained utilizing NASCET criteria, using the distal internal  carotid diameter as the denominator. CONTRAST:  100mL ISOVUE-370 IOPAMIDOL (ISOVUE-370) INJECTION 76% COMPARISON:  Head CT without contrast 1437 hours today. FINDINGS: CTA NECK Skeleton: No acute osseous abnormality identified. Cervical spine disc, endplate, and facet degeneration. Upper chest: Bilateral upper lung atelectasis. No superior mediastinal lymphadenopathy. Other neck: Negative; partially retropharyngeal course of both carotids-more so the left. No neck mass or lymphadenopathy. Aortic arch: 3 vessel arch configuration with mild arch atherosclerosis distal to the left subclavian origin. No great vessel origin atherosclerosis or stenosis. Right carotid system: Negative aside from mild tortuosity. Widely patent right carotid bifurcation. Left carotid system: Negative aside from tortuosity. Widely patent left carotid bifurcation. Vertebral arteries: Normal proximal right subclavian artery. The right vertebral artery is non dominant and diminutive but patent from its origin to the skull base without stenosis identified. Normal proximal left subclavian artery. The left vertebral artery origin is normal (series 8, image 171). The left vertebral is dominant. The left V1 segment is tortuous with a kinked appearance, but there is otherwise no cervical left vertebral artery stenosis. CTA HEAD Posterior circulation: Dominant distal left vertebral artery primarily supplies the basilar. The distal right vertebral artery functionally terminates in PICA. Normal left PICA origin. Patent basilar artery without stenosis. Normal SCA origins (duplicated on the right). There are fetal type bilateral PCA origins. Bilateral PCA branches are within normal limits. Anterior circulation: Both ICA siphons are patent. There is mild to moderate bilateral ICA siphon calcified plaque. No hemodynamically significant stenosis occurs on the left. Mild if any right ICA siphon stenosis in the cavernous and supraclinoid segments. The bilateral  ophthalmic and posterior communicating artery origins are normal. Patent carotid termini. Normal MCA and ACA origins. Anterior communicating artery is diminutive or absent. Bilateral ACA branches are within normal limits. Left MCA M1 segment and bifurcation are patent. There is mild irregularity and stenosis at the dominant posterior left M2 origin best seen on series 11, image 19. Left MCA branches otherwise are within normal limits. The right MCA M1 segment and bifurcation are patent and appear normal. Right MCA branches are within normal limits. Venous sinuses: Patent. Anatomic variants: Dominant left vertebral artery, the right terminates in PICA. Fetal type bilateral PCA origins. Delayed phase: Age indeterminate hypodensity re-demonstrated at the head of the caudate nucleus (series 13, image 15). Hypodensity also along the anterior limb of the left external capsule appears stable. No cortically based acute infarct identified. No abnormal enhancement identified. Review of the MIP images confirms the above findings IMPRESSION: 1. Negative for emergent large vessel occlusion. No hemodynamically significant arterial stenosis in the head or neck. 2. No significant extracranial atherosclerosis. Mild to moderate intracranial atherosclerosis most apparent at the ICA siphons and left MCA bifurcation. 3. Stable CT appearance of the brain from 1437 hours today. Age indeterminate small vessel ischemia in the right basal ganglia and left deep white matter capsules. Electronically Signed   By: Odessa FlemingH  Hall M.D.   On: 10/01/2017 16:09   Dg Chest 2 View  Result Date: 10/01/2017 CLINICAL DATA:  57 year old male with left side weakness beginning at  0200 hours. EXAM: CHEST  2 VIEW COMPARISON:  Chest radiographs 05/12/2015. FINDINGS: AP and lateral views of the chest. Lung volumes are stable and within normal limits. Mediastinal contours remain normal. Visualized tracheal air column is within normal limits. No pneumothorax, pleural  effusion or confluent pulmonary opacity. There is bilateral pulmonary vascular congestion, but no overt edema. No acute osseous abnormality identified. Negative visible bowel gas pattern. IMPRESSION: Mild pulmonary vascular congestion without overt edema. No other acute findings. Electronically Signed   By: Odessa Fleming M.D.   On: 10/01/2017 15:16   Ct Angio Neck W And/or Wo Contrast  Result Date: 10/01/2017 CLINICAL DATA:  57 year old male with left extremity weakness beginning at 0200 hours today. EXAM: CT ANGIOGRAPHY HEAD AND NECK TECHNIQUE: Multidetector CT imaging of the head and neck was performed using the standard protocol during bolus administration of intravenous contrast. Multiplanar CT image reconstructions and MIPs were obtained to evaluate the vascular anatomy. Carotid stenosis measurements (when applicable) are obtained utilizing NASCET criteria, using the distal internal carotid diameter as the denominator. CONTRAST:  ISOVUE-370 IOPAMIDOL (ISOVUE-370) INJECTION 76% COMPARISON:  Head CT without contrast 1437 hours today. FINDINGS: CTA NECK Skeleton: No acute osseous abnormality identified. Cervical spine disc, endplate, and facet degeneration. Upper chest: Bilateral upper lung atelectasis. No superior mediastinal lymphadenopathy. Other neck: Negative; partially retropharyngeal course of both carotids-more so the left. No neck mass or lymphadenopathy. Aortic arch: 3 vessel arch configuration with mild arch atherosclerosis distal to the left subclavian origin. No great vessel origin atherosclerosis or stenosis. Right carotid system: Negative aside from mild tortuosity. Widely patent right carotid bifurcation. Left carotid system: Negative aside from tortuosity. Widely patent left carotid bifurcation. Vertebral arteries: Normal proximal right subclavian artery. The right vertebral artery is non dominant and diminutive but patent from its origin to the skull base without stenosis identified. Normal  proximal left subclavian artery. The left vertebral artery origin is normal (series 8, image 171). The left vertebral is dominant. The left V1 segment is tortuous with a kinked appearance, but there is otherwise no cervical left vertebral artery stenosis. CTA HEAD Posterior circulation: Dominant distal left vertebral artery primarily supplies the basilar. The distal right vertebral artery functionally terminates in PICA. Normal left PICA origin. Patent basilar artery without stenosis. Normal SCA origins (duplicated on the right). There are fetal type bilateral PCA origins. Bilateral PCA branches are within normal limits. Anterior circulation: Both ICA siphons are patent. There is mild to moderate bilateral ICA siphon calcified plaque. No hemodynamically significant stenosis occurs on the left. Mild if any right ICA siphon stenosis in the cavernous and supraclinoid segments. The bilateral ophthalmic and posterior communicating artery origins are normal. Patent carotid termini. Normal MCA and ACA origins. Anterior communicating artery is diminutive or absent. Bilateral ACA branches are within normal limits. Left MCA M1 segment and bifurcation are patent. There is mild irregularity and stenosis at the dominant posterior left M2 origin best seen on series 11, image 19. Left MCA branches otherwise are within normal limits. The right MCA M1 segment and bifurcation are patent and appear normal. Right MCA branches are within normal limits. Venous sinuses: Patent. Anatomic variants: Dominant left vertebral artery, the right terminates in PICA. Fetal type bilateral PCA origins. Delayed phase: Age indeterminate hypodensity re-demonstrated at the head of the caudate nucleus (series 13, image 15). Hypodensity also along the anterior limb of the left external capsule appears stable. No cortically based acute infarct identified. No abnormal enhancement identified. Review of the MIP images  confirms the above findings IMPRESSION: 1.  Negative for emergent large vessel occlusion. No hemodynamically significant arterial stenosis in the head or neck. 2. No significant extracranial atherosclerosis. Mild to moderate intracranial atherosclerosis most apparent at the ICA siphons and left MCA bifurcation. 3. Stable CT appearance of the brain from 1437 hours today. Age indeterminate small vessel ischemia in the right basal ganglia and left deep white matter capsules. Electronically Signed   By: Odessa Fleming M.D.   On: 10/01/2017 16:09   Mr Brain Wo Contrast  Result Date: 10/02/2017 CLINICAL DATA:  57 y/o  M; weakness involving left leg and left arm. EXAM: MRI HEAD WITHOUT CONTRAST MRA HEAD WITHOUT CONTRAST TECHNIQUE: Multiplanar, multiecho pulse sequences of the brain and surrounding structures were obtained without intravenous contrast. Angiographic images of the head were obtained using MRA technique without contrast. COMPARISON:  10/01/2017 CT head and CT angiogram head. FINDINGS: MRI HEAD FINDINGS Brain: 1.7 x 0.7 x 0.6 cm (volume = 0.4 cm^3) focus of reduced diffusion in the right posterior limb of internal capsule compatible with acute/early subacute infarction. No hemorrhage or mass effect. Small chronic lacunar infarcts are present within bilateral putamen and right caudate head. Fewnonspecific foci of T2 FLAIR hyperintense signal abnormality in subcortical and periventricular white matter are compatible withmildchronic microvascular ischemic changes for age. Mildbrain parenchymal volume loss. No hydrocephalus, extra-axial collection, or effacement of basilar cisterns. Vascular: As below. Skull and upper cervical spine: Normal marrow signal. Sinuses/Orbits: Negative. Other: None. MRA HEAD FINDINGS Internal carotid arteries: Patent. Irregularity of bilateral carotid siphons compatible with atherosclerosis. Mild proximal right cavernous stenosis. Anterior cerebral arteries:  Patent. Middle cerebral arteries: Patent. Left M2 inferior division origin  mild stenosis. Anterior communicating artery: Not identified, likely hypoplastic or absent. Posterior communicating arteries:  Patent.  Bilateral fetal PCA. Posterior cerebral arteries:  Patent. Basilar artery:  Patent. Vertebral arteries: Patent. Diminutive right vertebral artery terminates in right PICA. No evidence of high-grade stenosis, large vessel occlusion, or aneurysm identified. IMPRESSION: 1. Small acute/early subacute infarction within right posterior limb of internal capsule. No hemorrhage or mass effect. 2. Mild chronic microvascular ischemic changes and parenchymal volume loss of the brain. Small chronic lacunar infarctions within the basal ganglia. 3. Patent anterior and posterior intracranial circulation. No large vessel occlusion, aneurysm, or significant stenosis. These results will be called to the ordering clinician or representative by the Radiologist Assistant, and communication documented in the PACS or zVision Dashboard. Electronically Signed   By: Mitzi Hansen M.D.   On: 10/02/2017 03:15   Mr Maxine Glenn Head Wo Contrast  Result Date: 10/02/2017 CLINICAL DATA:  57 y/o  M; weakness involving left leg and left arm. EXAM: MRI HEAD WITHOUT CONTRAST MRA HEAD WITHOUT CONTRAST TECHNIQUE: Multiplanar, multiecho pulse sequences of the brain and surrounding structures were obtained without intravenous contrast. Angiographic images of the head were obtained using MRA technique without contrast. COMPARISON:  10/01/2017 CT head and CT angiogram head. FINDINGS: MRI HEAD FINDINGS Brain: 1.7 x 0.7 x 0.6 cm (volume = 0.4 cm^3) focus of reduced diffusion in the right posterior limb of internal capsule compatible with acute/early subacute infarction. No hemorrhage or mass effect. Small chronic lacunar infarcts are present within bilateral putamen and right caudate head. Fewnonspecific foci of T2 FLAIR hyperintense signal abnormality in subcortical and periventricular white matter are compatible  withmildchronic microvascular ischemic changes for age. Mildbrain parenchymal volume loss. No hydrocephalus, extra-axial collection, or effacement of basilar cisterns. Vascular: As below. Skull and upper cervical spine: Normal marrow signal.  Sinuses/Orbits: Negative. Other: None. MRA HEAD FINDINGS Internal carotid arteries: Patent. Irregularity of bilateral carotid siphons compatible with atherosclerosis. Mild proximal right cavernous stenosis. Anterior cerebral arteries:  Patent. Middle cerebral arteries: Patent. Left M2 inferior division origin mild stenosis. Anterior communicating artery: Not identified, likely hypoplastic or absent. Posterior communicating arteries:  Patent.  Bilateral fetal PCA. Posterior cerebral arteries:  Patent. Basilar artery:  Patent. Vertebral arteries: Patent. Diminutive right vertebral artery terminates in right PICA. No evidence of high-grade stenosis, large vessel occlusion, or aneurysm identified. IMPRESSION: 1. Small acute/early subacute infarction within right posterior limb of internal capsule. No hemorrhage or mass effect. 2. Mild chronic microvascular ischemic changes and parenchymal volume loss of the brain. Small chronic lacunar infarctions within the basal ganglia. 3. Patent anterior and posterior intracranial circulation. No large vessel occlusion, aneurysm, or significant stenosis. These results will be called to the ordering clinician or representative by the Radiologist Assistant, and communication documented in the PACS or zVision Dashboard. Electronically Signed   By: Mitzi Hansen M.D.   On: 10/02/2017 03:15   Ct Head Code Stroke Wo Contrast  Result Date: 10/01/2017 CLINICAL DATA:  Code stroke. Acute onset of left upper and lower extremity weakness beginning at 2 a.m. today, 12 hours ago. EXAM: CT HEAD WITHOUT CONTRAST TECHNIQUE: Contiguous axial images were obtained from the base of the skull through the vertex without intravenous contrast. COMPARISON:   None. FINDINGS: Brain: Subtle hypoattenuation is present in the right caudate head and anterior right insular cortex. Asymmetric white matter hypoattenuation is present in the left external capsule. Other scattered subcortical white matter hypoattenuation is present. No other focal cortical abnormality is present. No acute hemorrhage or mass lesion is present. The ventricles are of normal size. No significant extra-axial fluid collection is present. Vascular: Atherosclerotic calcifications are present within the cavernous internal carotid arteries bilaterally. There is no hyperdense vessel. : Calvarium is intact. No focal lytic or blastic lesions are present. Sinuses/Orbits: The paranasal sinuses and mastoid air cells are clear. Visualized globes and orbits are within normal limits. Other: ASPECTS ([place] Stroke Program Early CT Score) - Ganglionic level infarction (caudate, lentiform nuclei, internal capsule, insula, M1-M3 cortex): 5/7 - Supraganglionic infarction (M4-M6 cortex): 3/3 Total score (0-10 with 10 being normal): 10/10 IMPRESSION: 1. Subtle hypoattenuation involving the right caudate head and anterior right insular cortex is concerning for acute/subacute ischemia. 2. Mild white matter disease otherwise. This likely reflects the sequela of chronic microvascular ischemia. 3. ASPECTS is 8/10 These results were called by telephone at the time of interpretation on 10/01/2017 at 2:40 pm to Dr. Doug Sou , who verbally acknowledged these results. Electronically Signed   By: Marin Roberts M.D.   On: 10/01/2017 14:42      TODAY-DAY OF DISCHARGE:  Subjective:   Nazario Ritsema today has no headache,no chest abdominal pain,no new weakness tingling or numbness, feels much better wants to go home today.   Objective:   Blood pressure (!) 164/99, pulse 76, temperature 98.7 F (37.1 C), temperature source Oral, resp. rate 20, height 5\' 4"  (1.626 m), weight 82.6 kg (182 lb), SpO2 98 %. No intake or  output data in the 24 hours ending 10/02/17 1634 Filed Weights   10/01/17 1353  Weight: 82.6 kg (182 lb)    Exam: Awake Alert, Oriented *3, No new F.N deficits, Normal affect [place].AT,PERRAL Supple Neck,No JVD, No cervical lymphadenopathy appriciated.  Symmetrical Chest wall movement, Good air movement bilaterally, CTAB RRR,No Gallops,Rubs or new Murmurs, No Parasternal Heave +ve  B.Sounds, Abd Soft, Non tender, No organomegaly appriciated, No rebound -guarding or rigidity. No Cyanosis, Clubbing or edema, No new Rash or bruise   PERTINENT RADIOLOGIC STUDIES: Ct Angio Head W Or Wo Contrast  Result Date: 10/01/2017 CLINICAL DATA:  57 year old male with left extremity weakness beginning at 0200 hours today. EXAM: CT ANGIOGRAPHY HEAD AND NECK TECHNIQUE: Multidetector CT imaging of the head and neck was performed using the standard protocol during bolus administration of intravenous contrast. Multiplanar CT image reconstructions and MIPs were obtained to evaluate the vascular anatomy. Carotid stenosis measurements (when applicable) are obtained utilizing NASCET criteria, using the distal internal carotid diameter as the denominator. CONTRAST:  ISOVUE-370 IOPAMIDOL (ISOVUE-370) INJECTION 76% COMPARISON:  Head CT without contrast 1437 hours today. FINDINGS: CTA NECK Skeleton: No acute osseous abnormality identified. Cervical spine disc, endplate, and facet degeneration. Upper chest: Bilateral upper lung atelectasis. No superior mediastinal lymphadenopathy. Other neck: Negative; partially retropharyngeal course of both carotids-more so the left. No neck mass or lymphadenopathy. Aortic arch: 3 vessel arch configuration with mild arch atherosclerosis distal to the left subclavian origin. No great vessel origin atherosclerosis or stenosis. Right carotid system: Negative aside from mild tortuosity. Widely patent right carotid bifurcation. Left carotid system: Negative aside from tortuosity. Widely patent  left carotid bifurcation. Vertebral arteries: Normal proximal right subclavian artery. The right vertebral artery is non dominant and diminutive but patent from its origin to the skull base without stenosis identified. Normal proximal left subclavian artery. The left vertebral artery origin is normal (series 8, image 171). The left vertebral is dominant. The left V1 segment is tortuous with a kinked appearance, but there is otherwise no cervical left vertebral artery stenosis. CTA HEAD Posterior circulation: Dominant distal left vertebral artery primarily supplies the basilar. The distal right vertebral artery functionally terminates in PICA. Normal left PICA origin. Patent basilar artery without stenosis. Normal SCA origins (duplicated on the right). There are fetal type bilateral PCA origins. Bilateral PCA branches are within normal limits. Anterior circulation: Both ICA siphons are patent. There is mild to moderate bilateral ICA siphon calcified plaque. No hemodynamically significant stenosis occurs on the left. Mild if any right ICA siphon stenosis in the cavernous and supraclinoid segments. The bilateral ophthalmic and posterior communicating artery origins are normal. Patent carotid termini. Normal MCA and ACA origins. Anterior communicating artery is diminutive or absent. Bilateral ACA branches are within normal limits. Left MCA M1 segment and bifurcation are patent. There is mild irregularity and stenosis at the dominant posterior left M2 origin best seen on series 11, image 19. Left MCA branches otherwise are within normal limits. The right MCA M1 segment and bifurcation are patent and appear normal. Right MCA branches are within normal limits. Venous sinuses: Patent. Anatomic variants: Dominant left vertebral artery, the right terminates in PICA. Fetal type bilateral PCA origins. Delayed phase: Age indeterminate hypodensity re-demonstrated at the head of the caudate nucleus (series 13, image 15).  Hypodensity also along the anterior limb of the left external capsule appears stable. No cortically based acute infarct identified. No abnormal enhancement identified. Review of the MIP images confirms the above findings IMPRESSION: 1. Negative for emergent large vessel occlusion. No hemodynamically significant arterial stenosis in the head or neck. 2. No significant extracranial atherosclerosis. Mild to moderate intracranial atherosclerosis most apparent at the ICA siphons and left MCA bifurcation. 3. Stable CT appearance of the brain from 1437 hours today. Age indeterminate small vessel ischemia in the right basal ganglia and left deep white matter  capsules. Electronically Signed   By: Odessa Fleming M.D.   On: 10/01/2017 16:09   Dg Chest 2 View  Result Date: 10/01/2017 CLINICAL DATA:  57 year old male with left side weakness beginning at 0200 hours. EXAM: CHEST  2 VIEW COMPARISON:  Chest radiographs 05/12/2015. FINDINGS: AP and lateral views of the chest. Lung volumes are stable and within normal limits. Mediastinal contours remain normal. Visualized tracheal air column is within normal limits. No pneumothorax, pleural effusion or confluent pulmonary opacity. There is bilateral pulmonary vascular congestion, but no overt edema. No acute osseous abnormality identified. Negative visible bowel gas pattern. IMPRESSION: Mild pulmonary vascular congestion without overt edema. No other acute findings. Electronically Signed   By: Odessa Fleming M.D.   On: 10/01/2017 15:16   Ct Angio Neck W And/or Wo Contrast  Result Date: 10/01/2017 CLINICAL DATA:  57 year old male with left extremity weakness beginning at 0200 hours today. EXAM: CT ANGIOGRAPHY HEAD AND NECK TECHNIQUE: Multidetector CT imaging of the head and neck was performed using the standard protocol during bolus administration of intravenous contrast. Multiplanar CT image reconstructions and MIPs were obtained to evaluate the vascular anatomy. Carotid stenosis  measurements (when applicable) are obtained utilizing NASCET criteria, using the distal internal carotid diameter as the denominator. CONTRAST:  ISOVUE-370 IOPAMIDOL (ISOVUE-370) INJECTION 76% COMPARISON:  Head CT without contrast 1437 hours today. FINDINGS: CTA NECK Skeleton: No acute osseous abnormality identified. Cervical spine disc, endplate, and facet degeneration. Upper chest: Bilateral upper lung atelectasis. No superior mediastinal lymphadenopathy. Other neck: Negative; partially retropharyngeal course of both carotids-more so the left. No neck mass or lymphadenopathy. Aortic arch: 3 vessel arch configuration with mild arch atherosclerosis distal to the left subclavian origin. No great vessel origin atherosclerosis or stenosis. Right carotid system: Negative aside from mild tortuosity. Widely patent right carotid bifurcation. Left carotid system: Negative aside from tortuosity. Widely patent left carotid bifurcation. Vertebral arteries: Normal proximal right subclavian artery. The right vertebral artery is non dominant and diminutive but patent from its origin to the skull base without stenosis identified. Normal proximal left subclavian artery. The left vertebral artery origin is normal (series 8, image 171). The left vertebral is dominant. The left V1 segment is tortuous with a kinked appearance, but there is otherwise no cervical left vertebral artery stenosis. CTA HEAD Posterior circulation: Dominant distal left vertebral artery primarily supplies the basilar. The distal right vertebral artery functionally terminates in PICA. Normal left PICA origin. Patent basilar artery without stenosis. Normal SCA origins (duplicated on the right). There are fetal type bilateral PCA origins. Bilateral PCA branches are within normal limits. Anterior circulation: Both ICA siphons are patent. There is mild to moderate bilateral ICA siphon calcified plaque. No hemodynamically significant stenosis occurs on the  left. Mild if any right ICA siphon stenosis in the cavernous and supraclinoid segments. The bilateral ophthalmic and posterior communicating artery origins are normal. Patent carotid termini. Normal MCA and ACA origins. Anterior communicating artery is diminutive or absent. Bilateral ACA branches are within normal limits. Left MCA M1 segment and bifurcation are patent. There is mild irregularity and stenosis at the dominant posterior left M2 origin best seen on series 11, image 19. Left MCA branches otherwise are within normal limits. The right MCA M1 segment and bifurcation are patent and appear normal. Right MCA branches are within normal limits. Venous sinuses: Patent. Anatomic variants: Dominant left vertebral artery, the right terminates in PICA. Fetal type bilateral PCA origins. Delayed phase: Age indeterminate hypodensity re-demonstrated at the head  of the caudate nucleus (series 13, image 15). Hypodensity also along the anterior limb of the left external capsule appears stable. No cortically based acute infarct identified. No abnormal enhancement identified. Review of the MIP images confirms the above findings IMPRESSION: 1. Negative for emergent large vessel occlusion. No hemodynamically significant arterial stenosis in the head or neck. 2. No significant extracranial atherosclerosis. Mild to moderate intracranial atherosclerosis most apparent at the ICA siphons and left MCA bifurcation. 3. Stable CT appearance of the brain from 1437 hours today. Age indeterminate small vessel ischemia in the right basal ganglia and left deep white matter capsules. Electronically Signed   By: Odessa Fleming M.D.   On: 10/01/2017 16:09   Mr Brain Wo Contrast  Result Date: 10/02/2017 CLINICAL DATA:  57 y/o  M; weakness involving left leg and left arm. EXAM: MRI HEAD WITHOUT CONTRAST MRA HEAD WITHOUT CONTRAST TECHNIQUE: Multiplanar, multiecho pulse sequences of the brain and surrounding structures were obtained without  intravenous contrast. Angiographic images of the head were obtained using MRA technique without contrast. COMPARISON:  10/01/2017 CT head and CT angiogram head. FINDINGS: MRI HEAD FINDINGS Brain: 1.7 x 0.7 x 0.6 cm (volume = 0.4 cm^3) focus of reduced diffusion in the right posterior limb of internal capsule compatible with acute/early subacute infarction. No hemorrhage or mass effect. Small chronic lacunar infarcts are present within bilateral putamen and right caudate head. Fewnonspecific foci of T2 FLAIR hyperintense signal abnormality in subcortical and periventricular white matter are compatible withmildchronic microvascular ischemic changes for age. Mildbrain parenchymal volume loss. No hydrocephalus, extra-axial collection, or effacement of basilar cisterns. Vascular: As below. Skull and upper cervical spine: Normal marrow signal. Sinuses/Orbits: Negative. Other: None. MRA HEAD FINDINGS Internal carotid arteries: Patent. Irregularity of bilateral carotid siphons compatible with atherosclerosis. Mild proximal right cavernous stenosis. Anterior cerebral arteries:  Patent. Middle cerebral arteries: Patent. Left M2 inferior division origin mild stenosis. Anterior communicating artery: Not identified, likely hypoplastic or absent. Posterior communicating arteries:  Patent.  Bilateral fetal PCA. Posterior cerebral arteries:  Patent. Basilar artery:  Patent. Vertebral arteries: Patent. Diminutive right vertebral artery terminates in right PICA. No evidence of high-grade stenosis, large vessel occlusion, or aneurysm identified. IMPRESSION: 1. Small acute/early subacute infarction within right posterior limb of internal capsule. No hemorrhage or mass effect. 2. Mild chronic microvascular ischemic changes and parenchymal volume loss of the brain. Small chronic lacunar infarctions within the basal ganglia. 3. Patent anterior and posterior intracranial circulation. No large vessel occlusion, aneurysm, or significant  stenosis. These results will be called to the ordering clinician or representative by the Radiologist Assistant, and communication documented in the PACS or zVision Dashboard. Electronically Signed   By: Mitzi Hansen M.D.   On: 10/02/2017 03:15   Mr Maxine Glenn Head Wo Contrast  Result Date: 10/02/2017 CLINICAL DATA:  57 y/o  M; weakness involving left leg and left arm. EXAM: MRI HEAD WITHOUT CONTRAST MRA HEAD WITHOUT CONTRAST TECHNIQUE: Multiplanar, multiecho pulse sequences of the brain and surrounding structures were obtained without intravenous contrast. Angiographic images of the head were obtained using MRA technique without contrast. COMPARISON:  10/01/2017 CT head and CT angiogram head. FINDINGS: MRI HEAD FINDINGS Brain: 1.7 x 0.7 x 0.6 cm (volume = 0.4 cm^3) focus of reduced diffusion in the right posterior limb of internal capsule compatible with acute/early subacute infarction. No hemorrhage or mass effect. Small chronic lacunar infarcts are present within bilateral putamen and right caudate head. Fewnonspecific foci of T2 FLAIR hyperintense signal abnormality in subcortical and  periventricular white matter are compatible withmildchronic microvascular ischemic changes for age. Mildbrain parenchymal volume loss. No hydrocephalus, extra-axial collection, or effacement of basilar cisterns. Vascular: As below. Skull and upper cervical spine: Normal marrow signal. Sinuses/Orbits: Negative. Other: None. MRA HEAD FINDINGS Internal carotid arteries: Patent. Irregularity of bilateral carotid siphons compatible with atherosclerosis. Mild proximal right cavernous stenosis. Anterior cerebral arteries:  Patent. Middle cerebral arteries: Patent. Left M2 inferior division origin mild stenosis. Anterior communicating artery: Not identified, likely hypoplastic or absent. Posterior communicating arteries:  Patent.  Bilateral fetal PCA. Posterior cerebral arteries:  Patent. Basilar artery:  Patent. Vertebral  arteries: Patent. Diminutive right vertebral artery terminates in right PICA. No evidence of high-grade stenosis, large vessel occlusion, or aneurysm identified. IMPRESSION: 1. Small acute/early subacute infarction within right posterior limb of internal capsule. No hemorrhage or mass effect. 2. Mild chronic microvascular ischemic changes and parenchymal volume loss of the brain. Small chronic lacunar infarctions within the basal ganglia. 3. Patent anterior and posterior intracranial circulation. No large vessel occlusion, aneurysm, or significant stenosis. These results will be called to the ordering clinician or representative by the Radiologist Assistant, and communication documented in the PACS or zVision Dashboard. Electronically Signed   By: Mitzi Hansen M.D.   On: 10/02/2017 03:15   Ct Head Code Stroke Wo Contrast  Result Date: 10/01/2017 CLINICAL DATA:  Code stroke. Acute onset of left upper and lower extremity weakness beginning at 2 a.m. today, 12 hours ago. EXAM: CT HEAD WITHOUT CONTRAST TECHNIQUE: Contiguous axial images were obtained from the base of the skull through the vertex without intravenous contrast. COMPARISON:  None. FINDINGS: Brain: Subtle hypoattenuation is present in the right caudate head and anterior right insular cortex. Asymmetric white matter hypoattenuation is present in the left external capsule. Other scattered subcortical white matter hypoattenuation is present. No other focal cortical abnormality is present. No acute hemorrhage or mass lesion is present. The ventricles are of normal size. No significant extra-axial fluid collection is present. Vascular: Atherosclerotic calcifications are present within the cavernous internal carotid arteries bilaterally. There is no hyperdense vessel. : Calvarium is intact. No focal lytic or blastic lesions are present. Sinuses/Orbits: The paranasal sinuses and mastoid air cells are clear. Visualized globes and orbits are within  normal limits. Other: ASPECTS ([place] Stroke Program Early CT Score) - Ganglionic level infarction (caudate, lentiform nuclei, internal capsule, insula, M1-M3 cortex): 5/7 - Supraganglionic infarction (M4-M6 cortex): 3/3 Total score (0-10 with 10 being normal): 10/10 IMPRESSION: 1. Subtle hypoattenuation involving the right caudate head and anterior right insular cortex is concerning for acute/subacute ischemia. 2. Mild white matter disease otherwise. This likely reflects the sequela of chronic microvascular ischemia. 3. ASPECTS is 8/10 These results were called by telephone at the time of interpretation on 10/01/2017 at 2:40 pm to Dr. Doug Sou , who verbally acknowledged these results. Electronically Signed   By: Marin Roberts M.D.   On: 10/01/2017 14:42     PERTINENT LAB RESULTS: CBC: Recent Labs    10/01/17 1415 10/02/17 0458  WBC 9.1 8.8  HGB 14.9 14.7  HCT 42.5 43.0  PLT 228 211   CMET CMP     Component Value Date/Time   NA 133 (L) 10/01/2017 1415   K 3.5 10/01/2017 1415   CL 100 (L) 10/01/2017 1415   CO2 22 10/01/2017 1415   GLUCOSE 104 (H) 10/01/2017 1415   BUN 15 10/01/2017 1415   CREATININE 1.04 10/01/2017 1415   CALCIUM 8.8 (L) 10/01/2017 1415   PROT  8.6 (H) 10/01/2017 1415   ALBUMIN 4.2 10/01/2017 1415   AST 32 10/01/2017 1415   ALT 29 10/01/2017 1415   ALKPHOS 71 10/01/2017 1415   BILITOT 0.7 10/01/2017 1415   GFRNONAA >60 10/01/2017 1415   GFRAA >60 10/01/2017 1415    GFR Estimated Creatinine Clearance: 78.8 mL/min (by C-G formula based on SCr of 1.04 mg/dL). No results for input(s): LIPASE, AMYLASE in the last 72 hours. Recent Labs    10/01/17 1415  TROPONINI <0.03   Invalid input(s): POCBNP No results for input(s): DDIMER in the last 72 hours. Recent Labs    10/02/17 0458  HGBA1C 6.2*   Recent Labs    10/02/17 0458  CHOL 232*  HDL 69  LDLCALC 142*  TRIG 105  CHOLHDL 3.4   No results for input(s): TSH, T4TOTAL, T3FREE, THYROIDAB  in the last 72 hours.  Invalid input(s): FREET3 No results for input(s): VITAMINB12, FOLATE, FERRITIN, TIBC, IRON, RETICCTPCT in the last 72 hours. Coags: Recent Labs    10/01/17 1415  INR 0.93   Microbiology: No results found for this or any previous visit (from the past 240 hour(s)).  FURTHER DISCHARGE INSTRUCTIONS:  Get Medicines reviewed and adjusted: Please take all your medications with you for your next visit with your Primary MD  Laboratory/radiological data: Please request your Primary MD to go over all hospital tests and procedure/radiological results at the follow up, please ask your Primary MD to get all Hospital records sent to his/her office.  In some cases, they will be blood work, cultures and biopsy results pending at the time of your discharge. Please request that your primary care M.D. goes through all the records of your hospital data and follows up on these results.  Also Note the following: If you experience worsening of your admission symptoms, develop shortness of breath, life threatening emergency, suicidal or homicidal thoughts you must seek medical attention immediately by calling 911 or calling your MD immediately  if symptoms less severe.  You must read complete instructions/literature along with all the possible adverse reactions/side effects for all the Medicines you take and that have been prescribed to you. Take any new Medicines after you have completely understood and accpet all the possible adverse reactions/side effects.   Do not drive when taking Pain medications or sleeping medications (Benzodaizepines)  Do not take more than prescribed Pain, Sleep and Anxiety Medications. It is not advisable to combine anxiety,sleep and pain medications without talking with your primary care practitioner  Special Instructions: If you have smoked or chewed Tobacco  in the last 2 yrs please stop smoking, stop any regular Alcohol  and or any Recreational drug  use.  Wear Seat belts while driving.  Please note: You were cared for by a hospitalist during your hospital stay. Once you are discharged, your primary care physician will handle any further medical issues. Please note that NO REFILLS for any discharge medications will be authorized once you are discharged, as it is imperative that you return to your primary care physician (or establish a relationship with a primary care physician if you do not have one) for your post hospital discharge needs so that they can reassess your need for medications and monitor your lab values.  Total Time spent coordinating discharge including counseling, education and face to face time equals  35 minutes.  SignedJeoffrey Massed 10/02/2017 4:34 PM

## 2017-10-02 NOTE — PMR Pre-admission (Signed)
PMR Admission Coordinator Pre-Admission Assessment  Patient: Willie Herrera is an 56 y.o., male MRN: [id number] DOB: 12/21/1960 Height: 5' 4" (162.6 cm) Weight: 82.6 kg (182 lb)              Insurance Information  PRIMARY: uninsured       I spoke with financial counselor, Crystal at [phone]. She met with pt and family at bedside per my request  For possible medicaid application. She also discussed a 55 % discounted rate if not eligible for Medicaid.  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 He has not seen a doctor in 2-3 years. Presented 2/27/2019with left-sided weakness/fall and slurred speech. Patient hypertensive 200/120. EKG sinus rhythm with diffuse ST-ST abnormalities and no prior comparison. Troponin negative. Cranial CT scan showed subtle hypo attenuation involving the right caudate head and anterior right insular cortex concerning for acute subacute ischemia. CT a of the head and neck negative for emergent large vessel occlusion. UDS negative. Patient did not receive TPA. 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 No hemorrhage or mass effect.No Large vessel occlusion or aneurysm without stenosis. Currently on aspirin for CVA prophylaxis. Subcutaneous Lovenox for DVT prophylaxis. Echocardiogramshowed LVH no valvular disease, no Jann.. Tolerating a regular diet   Total: 4 NIHSS    Past Medical History  Past Medical History:   Diagnosis Date  . Alcohol abuse   . HTN (hypertension) 08/25/2014  . Hyperlipidemia     Family History  family history includes Hypertension in his mother; Stroke in his maternal grandfather.  Prior Rehab/Hospitalizations:  Has the patient had major surgery during 100 days prior to admission? No  Current Medications   Current Facility-Administered Medications:  .  acetaminophen (TYLENOL) tablet 650 mg, 650 mg, Oral, Q4H PRN, 650 mg at 10/01/17 2204 **OR** acetaminophen (TYLENOL) solution 650 mg, 650 mg, Per Tube, Q4H PRN **OR** acetaminophen (TYLENOL) suppository 650 mg, 650 mg, Rectal, Q4H PRN, Opyd, Timothy S, MD .  aspirin suppository 300 mg, 300 mg, Rectal, Daily **OR** aspirin tablet 325 mg, 325 mg, Oral, Daily, Opyd, Timothy S, MD, 325 mg at 10/02/17 1048 .  atorvastatin (LIPITOR) tablet 40 mg, 40 mg, Oral, q1800, Costello, Mary A, NP .  enoxaparin (LOVENOX) injection 40 mg, 40 mg, Subcutaneous, Q24H, Opyd, Timothy S, MD .  HYDROcodone-acetaminophen (NORCO/VICODIN) 5-325 MG per tablet 1-2 tablet, 1-2 tablet, Oral, Q4H PRN, Schorr, Katherine P, NP, 1 tablet at 10/02/17 0026 .  labetalol (NORMODYNE,TRANDATE) injection 5-10 mg, 5-10 mg, Intravenous, Q2H PRN, Opyd, Timothy S, MD, 5 mg at 10/02/17 0836 .  nicotine (NICODERM CQ - dosed in mg/24 hours) patch 21 mg, 21 mg, Transdermal, Daily, Costello, Mary A, NP, 21 mg at 10/02/17 1141 .  senna-docusate (Senokot-S) tablet 1 tablet, 1 tablet, Oral, QHS PRN, Opyd, Timothy S, MD  Patients Current Diet: Fall precautions Diet Heart Room service appropriate? 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 None of the above  Prior Functional Level Prior Function Level of Independence: Independent Comments: (works through [employer] out of state and staying in)  Self Care: Did the patient need help bathing, dressing, using the toilet or eating?  Independent  Indoor Mobility: Did the patient need assistance with walking from room to room (with or without device)? Independent  Stairs: Did the patient need assistance with internal or external stairs (with or without device)? Independent  Functional Cognition: Did the patient need help planning regular tasks such as shopping or remembering to take medications? Independent  Current Functional Level Cognition  Overall Cognitive Status: Within Functional Limits for tasks assessed Orientation Level: Oriented X4 General Comments: WFL for basic info     Extremity Assessment (includes Sensation/Coordination)  Upper Extremity Assessment: LUE deficits/detail RUE Deficits / Details: Weaker grip than left but somewhat functional. 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  He demonstrates decreased control of Lt UE.  He reports he attempted to feed self with Lt UE  LUE Coordination: decreased fine motor, decreased gross motor  Lower Extremity Assessment: Defer to PT evaluation LLE Deficits / Details: Grossly ~4/5 throughout.  LLE Sensation: WNL LLE Coordination: decreased fine motor, decreased gross motor    ADLs  Overall ADL's : Needs assistance/impaired Eating/Feeding: Set up, Sitting Grooming: Wash/dry hands, Wash/dry face, Oral care, Brushing hair, Set up, Supervision/safety, Sitting Upper Body Bathing: Moderate assistance, Sitting Lower Body Bathing:  Moderate assistance, Sit to/from stand Upper Body Dressing : Moderate assistance, Sitting Lower Body Dressing: Maximal assistance, Sit to/from stand Toilet Transfer: Stand-pivot, BSC, Minimal assistance Toileting- Clothing Manipulation and Hygiene: Maximal assistance, Sit to/from stand Functional mobility during ADLs: Moderate assistance    Mobility  Overal bed mobility: Needs Assistance Bed Mobility: Supine to Sit, Sit to Supine Supine to sit: Supervision, HOB elevated Sit to supine: Min assist General bed mobility comments: assist     Transfers  Overall transfer level: Needs assistance Equipment used: None Transfers: Sit to/from Stand, Stand Pivot Transfers Sit to Stand: Min guard, Min assist Stand pivot transfers: Min assist General transfer comment: Min guard to steady in standing. Stood from [employer].     Ambulation / Gait / Stairs / Wheelchair Mobility  Ambulation/Gait Ambulation/Gait assistance: Mod assist Ambulation Distance (Feet): 50 Feet Assistive device: 1 person hand held assist Gait Pattern/deviations: Step-to pattern, Step-through pattern, Narrow base of support, Decreased dorsiflexion - left, Steppage General Gait Details: Pt with decreased foot clearance LLE, drags it when fatigued; demonstrates steppage gait with cues to pick up LLE. Leans left. Some coordination difficulties noted as well. Cues to slow down. Gait velocity: decreased Gait velocity interpretation: Below normal speed for age/gender    Posture / Balance Dynamic Sitting Balance Sitting balance - Comments: Good static sitting balance but LOB in all directions when trying to donn socks. Balance Overall balance assessment: Needs assistance Sitting-balance support: Feet supported, No upper extremity supported Sitting balance-Leahy Scale: Fair Sitting balance - Comments: Good  static sitting balance but LOB in all directions when trying to donn socks. Standing balance support: Single extremity  supported Standing balance-Leahy Scale: Fair Standing balance comment: Able to perform static standing with close Min guard, requires external support for dynamic standing.     Special needs/care consideration BiPAP/CPAP  N/a CPM  N/a Continuous Drip IV  N/a Dialysis  N/a Life Vest  N/a Oxygen  N/a Special Bed  N/a Trach Size  N/a Wound Vac n/a Skin intact Bowel mgmt: continent bladder mgmt: continent Diabetic mgmt n/a [place]ese interpreter needed; pt limited [place] but understands more than he speaks per son; Son fluent English   Previous Home Environment Living Arrangements: (wife and son)  Lives With: Spouse, Son Available Help at Discharge: (wife and son works days) Type of Home: [place]: One level, Laundry or work area in [place]: Stairs to enter Entrance Stairs-Rails: Building surveyor of Steps: 3 Bathroom Shower/Tub: Chiropodist: Programmer, systems: Yes How Accessible: Accessible via [place]: No  Discharge [place] for Discharge Living Setting: Patient's home, Lives with (comment)(wife and adult son) Type of Home at Discharge: House Discharge [place]: One level, Laundry or work area in basement Discharge Home Access: Stairs to enter Entrance Stairs-Rails: Right Entrance Stairs-Number of Steps: 3 Discharge Bathroom Shower/Tub: Tub/shower unit Discharge Bathroom Toilet: Standard Discharge Bathroom Accessibility: Yes How Accessible: Accessible via walker Does the patient have any problems obtaining your medications?: Yes (Describe)(uninsured)  Social/Family/Support Systems Patient Roles: Spouse, Parent(part time chef) Contact Information: son, Araceli Bouche, speaks fluent English Anticipated Caregiver: wife and son when not at work Anticipated Ambulance person Information: [phone] Ability/Limitations of Caregiver: wife and son work days Caregiver Availability:  Evenings only Discharge Plan Discussed with Primary Caregiver: Yes Is Caregiver In Agreement with Plan?: Yes Does Caregiver/Family have Issues with Lodging/Transportation while Pt is in Rehab?: No(wife or son stay with pt in hospital when not at work)  Goals/Additional Needs Patient/Family Goal for Rehab: Mod I with PT and OT Expected length of stay: [facility] 10 to 14 days Cultural Considerations: [place]ese; legal citizens now and been in [place] for 15 years Pt/Family Agrees to Admission and willing to participate: Yes Program Orientation Provided & Reviewed with Pt/Caregiver Including Roles  & Responsibilities: Yes  Decrease burden of Care through IP rehab admission: n/a  Possible need for SNF placement upon discharge:not anticipated  Patient Condition: This patient's condition remains as documented in the consult dated 10/02/2017, in which the Rehabilitation Physician determined and documented that the patient's condition is appropriate for intensive rehabilitative care in an inpatient rehabilitation facility. Will admit to inpatient rehab today.  Preadmission Screen Completed By:  Cleatrice Burke, 10/02/2017 4:41 PM ______________________________________________________________________   Discussed status with Dr. Letta Pate on 10/02/2017 at  1650 and received telephone approval for admission today.  Admission Coordinator:  Cleatrice Burke, time 8185 Date 10/02/2017

## 2017-10-02 NOTE — Progress Notes (Signed)
Follow-up BP 169/89;  Resting quietly.

## 2017-10-02 NOTE — Progress Notes (Signed)
Patient and family have met with financial counselor and are now in agreement to admit to CIR today. I contacted Dr. Nigel Bridgeman to make the arrangements to d/c to CIR today. RN CM made aware. I will make the arrangements to admit today. [phone]

## 2017-10-02 NOTE — Evaluation (Signed)
Occupational Therapy Evaluation Patient Details Name: Rolene CourseLwin Courser MRN: [id number] DOB: 06/29/1961 Today's Date: 10/02/2017    History of Present Illness Patient is a 57 y/o male who presents with left sided weakness and dysarthria. Head CT- acute/subacute infarct right caudate and anterior right insular cortex. MRI [facility]- acute infarct right posterior limb of internal capsule. PMH includes HTN.   Clinical Impression   the patient admitted with above, and demonstrates the below deficits.  He demonstrates Lt hemiparesis, impaired balance.  He currently requires min - mod a for ADLs.  He was fully independent PTA.  Family very supportive.  Agree with CIR.  Pt for probable discharge to CIR today, will defer further OT to CIR, and acute OT will sign off at this time.      Follow Up Recommendations  CIR;Supervision/Assistance - 24 hour    Equipment Recommendations  None recommended by OT    Recommendations for Other Services       Precautions / Restrictions Precautions Precautions: Fall      Mobility Bed Mobility Overal bed mobility: Needs Assistance         Sit to supine: Min assist   General bed mobility comments: assist   Transfers Overall transfer level: Needs assistance   Transfers: Sit to/from Stand;Stand Pivot Transfers Sit to Stand: Min guard;Min assist Stand pivot transfers: Min assist            Balance Overall balance assessment: Needs assistance Sitting-balance support: Feet supported;No upper extremity supported Sitting balance-Leahy Scale: Fair     Standing balance support: Single extremity supported Standing balance-Leahy Scale: Fair                             ADL either performed or assessed with clinical judgement   ADL Overall ADL's : Needs assistance/impaired Eating/Feeding: Set up;Sitting   Grooming: Wash/dry hands;Wash/dry face;Oral care;Brushing hair;Set up;Supervision/safety;Sitting   Upper Body Bathing: Moderate  assistance;Sitting   Lower Body Bathing: Moderate assistance;Sit to/from stand   Upper Body Dressing : Moderate assistance;Sitting   Lower Body Dressing: Maximal assistance;Sit to/from stand   Toilet Transfer: Stand-pivot;BSC;Minimal assistance   Toileting- Clothing Manipulation and Hygiene: Maximal assistance;Sit to/from stand       Functional mobility during ADLs: Moderate assistance       Vision Baseline Vision/History: No visual deficits Patient Visual Report: No change from baseline Vision Assessment?: No apparent visual deficits     Perception Perception Perception Tested?: Yes   Praxis Praxis Praxis tested?: Within functional limits    Pertinent Vitals/Pain Pain Assessment: No/denies pain     Hand Dominance Right   Extremity/Trunk Assessment Upper Extremity Assessment Upper Extremity Assessment: LUE deficits/detail LUE Deficits / Details: Pt demonstrates 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  He demonstrates decreased control of Lt UE.  He reports he attempted to feed self with Lt UE  LUE Coordination: decreased fine motor;decreased gross motor   Lower Extremity Assessment Lower Extremity Assessment: Defer to PT evaluation   Cervical / Trunk Assessment Cervical / Trunk Assessment: Normal   Communication Communication Communication: Prefers language other than English(able to communicate on basic level )   Cognition Arousal/Alertness: Awake/alert Behavior During Therapy: WFL for tasks assessed/performed Overall Cognitive Status: Within Functional Limits for tasks assessed                                 General  Comments: WFL for basic info    General Comments  family present.  Instructed them to call for assist prior to moving pt     Exercises     Shoulder Instructions      Home Living Family/patient expects to be discharged to:: Private residence Living Arrangements: Children;Spouse/significant  other Available Help at Discharge: Family;Available PRN/intermittently Type of Home: House Home Access: Stairs to enter [employer] of Steps: 3 Entrance Stairs-Rails: Right Home Layout: One level;Laundry or work area in basement     [employer] Shower/Tub: Chief Strategy Officer: [employer]: None          Prior Functioning/Environment Level of Independence: Independent        Comments: Works as Engineer, 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         OT Problem List: Decreased strength;Decreased range of motion;Decreased activity tolerance;Impaired balance (sitting and/or standing);Decreased coordination;Decreased safety awareness;Decreased knowledge of use of DME or AE;Impaired UE functional use      OT Treatment/Interventions:      OT Goals(Current goals can be found in the care plan section) Acute Rehab OT Goals Patient Stated Goal: regain independence  OT Goal Formulation: All assessment and education complete, DC therapy  OT Frequency:     Barriers to D/C:            Co-evaluation              AM-PAC PT "6 Clicks" Daily Activity     Outcome Measure Help from another person eating meals?: A Little Help from another person taking care of personal grooming?: A Little Help from another person toileting, which includes using toliet, bedpan, or urinal?: A Lot Help from another person bathing (including washing, rinsing, drying)?: A Lot Help from another person to put on and taking off regular upper body clothing?: A Lot Help from another person to put on and taking off regular lower body clothing?: A Lot 6 Click Score: 14   End of Session Nurse Communication: Mobility status  Activity Tolerance: Patient tolerated treatment well Patient left: in bed;with call bell/phone within reach;with family/visitor present  OT Visit Diagnosis: Hemiplegia and hemiparesis Hemiplegia - Right/Left: Left Hemiplegia -  dominant/non-dominant: Non-Dominant Hemiplegia - caused by: Cerebral infarction                Time: 1610-9604 OT Time Calculation (min): 8 min Charges:  OT General Charges $OT Visit: 1 Visit OT Evaluation $OT Eval Moderate Complexity: 1 Mod G-Codes:     [employer], OTR/L [phone]   Jeani Hawking M 10/02/2017, 4:32 PM

## 2017-10-02 NOTE — Progress Notes (Signed)
Kirsteins, Victorino Sparrow, MD      Wynn Banker Victorino Sparrow, MD  Physician  Physical Medicine and Rehabilitation      Consult Note  Signed     Date of Service:  10/02/2017 11:15 AM         Related encounter: ED to Hosp-Admission (Discharged) from 10/01/2017 in [place] [facility]           Signed          Expand All Collapse All            Expand widget buttonCollapse widget button     Hide copied text   Hover for detailscustomization button                                                                                                               untitled image              Physical Medicine and Rehabilitation Consult  Reason for Consult: Left-sided weakness and dysarthria  Referring Physician: [facility]        HPI: Willie Herrera is a 57 y.o. right handed male with history of hypertension and tobacco abuse on no prescription medications. He has not seen a doctor in 2-3 years. Per chart review patient lives with his family. 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 Family works during the day. One level home 3 steps to entry. Presented 10/01/2017 with left-sided weakness/fall and slurred speech. Patient hypertensive 200/120. EKG sinus rhythm with diffuse ST-ST abnormalities and no prior comparison. Troponin negative. Cranial CT scan showed subtle hypoattenuation involving the right caudate head and anterior right insular cortex concerning for acute subacute ischemia. CT a of the head and neck negative for emergent large vessel occlusion. UDS negative. Patient did not receive TPA. MRI showed small acute early subacute infarct within right posterior limb of internal capsule. No hemorrhage or mass effect. Currently on aspirin for CVA prophylaxis. Subcutaneous Lovenox for  DVT prophylaxis. Echocardiogram showed LVH no valvular disease, no Moseley.        Review of Systems   Constitutional: Negative for chills and fever.   HENT: Negative for hearing loss.    Eyes: Negative for blurred vision and double vision.   Respiratory: Negative for shortness of breath.    Cardiovascular: Negative for chest pain, palpitations and leg swelling.   Gastrointestinal: Positive for constipation and nausea. Negative for vomiting.   Genitourinary: Negative for dysuria, flank pain and hematuria.   Musculoskeletal: Positive for myalgias.   Skin: Negative for rash.   Neurological: Positive for speech change, focal weakness and headaches.   All other systems reviewed and are negative.          Past Medical History:    Diagnosis   Date    .   HTN (hypertension)   08/25/2014       History reviewed. No pertinent surgical  history.        Family History    Problem   Relation   Age of Onset    .   Hypertension   Mother        .   Stroke   Maternal Grandfather           Social History:  reports that he has been smoking.  he has never used smokeless tobacco. He reports that he drinks alcohol. He reports that he does not use drugs.  Allergies: No Known Allergies   No medications prior to admission.          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   Functional Status:   Mobility:  Bed  Mobility  Overal bed mobility: Needs Assistance  Bed Mobility: Supine to Sit, Sit to Supine  Supine to sit: Supervision, HOB elevated  Sit to supine: Supervision, HOB elevated  General bed mobility comments: Increased effort to get to EOB with impaired balance in all directions initially. Able to return to supine without difficulty.   Transfers  Overall transfer level: Needs assistance  Equipment used: None  Transfers: Sit to/from Stand  Sit to Stand: Min guard  General transfer comment: Min guard to steady in standing. Stood from AllstateEOB x1.   Ambulation/Gait  Ambulation/Gait assistance: Mod assist  Ambulation Distance (Feet): 50 Feet  Assistive device: 1 person hand held assist  Gait Pattern/deviations: Step-to pattern, Step-through pattern, Narrow base of support, Decreased dorsiflexion - left, Steppage  General Gait Details: Pt with decreased foot clearance LLE, drags it when fatigued; demonstrates steppage gait with cues to pick up LLE. Leans left. Some coordination difficulties noted as well. Cues to slow down.  Gait velocity: decreased  Gait velocity interpretation: Below normal speed for age/gender       ADL:       Cognition:  Cognition  Overall Cognitive Status: Within Functional Limits for tasks assessed  Orientation Level: Oriented X4  Cognition  Arousal/Alertness: Awake/alert  Behavior During Therapy: WFL for tasks assessed/performed  Overall Cognitive Status: Within Functional Limits for tasks assessed  General Comments: A&Ox4 and able to follow simple 1-2 step commands. Difficult to fully assess due to language.     Blood pressure (!) 171/106, pulse 74, temperature 98.1 F (36.7 C), temperature source Oral, resp. rate 16, height 5\' 4"  (1.626 m), weight 82.6 kg (182 lb), SpO2 96 %.  Physical Exam   Constitutional:  57 year old right-handed male sitting up in bed   HENT:  Mild left facial droop   Eyes:  Pupils reactive to light    Neck: Normal range of motion. Neck supple. No thyromegaly present.   Cardiovascular: Normal rate, regular rhythm and normal heart sounds.   Respiratory: Effort normal and breath sounds normal. No respiratory distress.   GI: Soft. Bowel sounds are normal. He exhibits no distension.  Neurological: He is alert.  Patient speaks English but on a limited basis. He could provide his name and age. Provides date of birth. 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 Yes   2.Co-Morbidities requiring supervision/potential complications: Hypertension, history of tobacco abuse   3.Due to bladder management, bowel management, safety, skin/wound care, disease management, medication administration, pain management and patient  education, does the patient require 24 hr/day rehab nursing? Yes   4.Does the patient require coordinated care of a physician, rehab nurse, PT (1-2 hrs/day, 5 days/week) and OT (1-2 hrs/day, 5 days/week) to address physical and functional deficits in the context of the above medical diagnosis(es)? Yes Addressing deficits in the following areas: balance, endurance, locomotion, strength, transferring, bowel/bladder control, bathing, dressing, feeding, grooming, toileting and psychosocial support   5.Can the patient actively participate in an intensive therapy program of at least 3 hrs of therapy per day at least 5 days per week? Yes   6.The potential for patient to make measurable gains while on inpatient rehab is excellent   7.Anticipated functional outcomes upon discharge from inpatient rehab are modified independent  with PT, modified independent with OT, n/a with SLP.   8.Estimated rehab length of stay to reach the above functional goals is: 10-14d   9.Anticipated D/C setting: Home   10.Anticipated post D/C treatments: HH therapy   11.Overall Rehab/Functional Prognosis: excellent      RECOMMENDATIONS:  This patient's condition is appropriate for continued rehabilitative care in the following setting: CIR  Patient has agreed to participate in recommended program. Family checking into options given no insurance  Note that insurance prior authorization may be required for reimbursement for recommended care.     Comment:      Erick Colace M.D.  [facility]  FAAPM&R (Sports Med, Neuromuscular Med)  Diplomate Am Board of Electrodiagnostic Med     Lynnae Prude  10/02/2017                Revision History                                        Routing History

## 2017-10-02 NOTE — Progress Notes (Signed)
  Echocardiogram 2D Echocardiogram has been performed.  Willie Herrera Willie Herrera 10/02/2017, 9:26 AM

## 2017-10-03 ENCOUNTER — Inpatient Hospital Stay (HOSPITAL_COMMUNITY): Payer: Self-pay | Admitting: Occupational Therapy

## 2017-10-03 ENCOUNTER — Inpatient Hospital Stay (HOSPITAL_COMMUNITY): Payer: Self-pay

## 2017-10-03 DIAGNOSIS — I6309 Cerebral infarction due to thrombosis of other precerebral artery: Secondary | ICD-10-CM

## 2017-10-03 DIAGNOSIS — I639 Cerebral infarction, unspecified: Secondary | ICD-10-CM

## 2017-10-03 DIAGNOSIS — I10 Essential (primary) hypertension: Secondary | ICD-10-CM

## 2017-10-03 DIAGNOSIS — G8114 Spastic hemiplegia affecting left nondominant side: Secondary | ICD-10-CM

## 2017-10-03 LAB — COMPREHENSIVE METABOLIC PANEL
ALBUMIN: 3.9 g/dL (ref 3.5–5.0)
ALT: 31 U/L (ref 17–63)
ANION GAP: 11 (ref 5–15)
AST: 33 U/L (ref 15–41)
Alkaline Phosphatase: 69 U/L (ref 38–126)
BILIRUBIN TOTAL: 0.7 mg/dL (ref 0.3–1.2)
BUN: 12 mg/dL (ref 6–20)
CHLORIDE: 102 mmol/L (ref 101–111)
CO2: 23 mmol/L (ref 22–32)
Calcium: 9.1 mg/dL (ref 8.9–10.3)
Creatinine, Ser: 1.14 mg/dL (ref 0.61–1.24)
GFR calc Af Amer: >60 mL/min (ref >60)
GFR calc non Af Amer: >60 mL/min (ref >60)
GLUCOSE: 110 mg/dL — AB (ref 65–99)
POTASSIUM: 3.6 mmol/L (ref 3.5–5.1)
Sodium: 136 mmol/L (ref 135–145)
Total Protein: 8.2 g/dL — ABNORMAL HIGH (ref 6.5–8.1)

## 2017-10-03 LAB — CBC WITH DIFFERENTIAL/PLATELET
BASOS ABS: 0 10*3/uL (ref 0.0–0.1)
BASOS PCT: 1 %
EOS ABS: 0.3 10*3/uL (ref 0.0–0.7)
EOS PCT: 3 %
HCT: 46.1 % (ref 39.0–52.0)
Hemoglobin: 15.5 g/dL (ref 13.0–17.0)
Lymphocytes Relative: 38 %
Lymphs Abs: 3.3 10*3/uL (ref 0.7–4.0)
MCH: 26.1 pg (ref 26.0–34.0)
MCHC: 33.6 g/dL (ref 30.0–36.0)
MCV: 77.5 fL — ABNORMAL LOW (ref 78.0–100.0)
MONO ABS: 0.7 10*3/uL (ref 0.1–1.0)
MONOS PCT: 8 %
Neutro Abs: 4.3 10*3/uL (ref 1.7–7.7)
Neutrophils Relative %: 50 %
PLATELETS: 194 10*3/uL (ref 150–400)
RBC: 5.95 MIL/uL — ABNORMAL HIGH (ref 4.22–5.81)
RDW: 15 % (ref 11.5–15.5)
WBC: 8.6 10*3/uL (ref 4.0–10.5)

## 2017-10-03 MED ORDER — LISINOPRIL 2.5 MG PO TABS
2.5000 mg | ORAL_TABLET | Freq: Two times a day (BID) | ORAL | Status: DC
  Administered 2017-10-03 – 2017-10-08 (×11): 2.5 mg via ORAL
  Filled 2017-10-03 (×12): qty 1

## 2017-10-03 NOTE — Progress Notes (Signed)
Social Work  Social Work Assessment and Plan  Patient Details  Name: Willie Herrera MRN: [id number] Date of Birth: 04/11/1961  Today's Date: 10/03/2017  Problem List:  Patient Active Problem List   Diagnosis Date Noted  . CVA (cerebral vascular accident) (HCC) 10/02/2017  . Alcohol abuse   . Hyperlipidemia   . Ischemic stroke (HCC) 10/01/2017  . Hypertensive urgency 10/01/2017  . Cough 09/13/2014  . HTN (hypertension) 08/25/2014  . Hyperglycemia 08/25/2014  . Allergic rhinitis 08/25/2014  . Routine general medical examination at a health care facility 05/03/2014  . Insomnia 04/18/2014  . Obesity, unspecified 04/18/2014  . Smoker 05/29/2010  . SHORTNESS OF BREATH 05/15/2010  . ABDOMINAL DISTENSION 05/15/2010   Past Medical History:  Past Medical History:  Diagnosis Date  . Alcohol abuse   . HTN (hypertension) 08/25/2014  . Hyperlipidemia    Past Surgical History: History reviewed. No pertinent surgical history. Social History:  reports that he has been smoking.  he has never used smokeless tobacco. He reports that he drinks alcohol. He reports that he does not use drugs.  Family / Support Systems Marital Status: Married Patient Roles: Spouse, Parent, Other (Comment)(Sue Chef) Spouse/Significant Other: Hmwe-wife  [phone] Children: Minmin-son [phone] Other Supports: Extended family and freinds Anticipated Caregiver: Wife and son Ability/Limitations of Caregiver: Both work days Caregiver Availability: Evenings only Family Dynamics: Close knit family all are supportive of one another, many freinds and extended family who have visited and brought food to pt. He feels he has good supports but does not want to rely upon them.  Social History Preferred language: English Religion:  Cultural Background: From [place] Education: had some education in [place] Read: Yes(Burmese) Write: Yes(Burmese) Employment Status: Unemployed Date Retired/Disabled/Unemployed: one  month Fish farm managerLegal Hisotry/Current Legal Issues: No issues Guardian/Conservator: None-according to MD pt is capable of making his own decisions while here, needs an interpreter to fully understand, he does speak limited English   Abuse/Neglect Abuse/Neglect Assessment Can Be Completed: Yes Physical Abuse: Denies Verbal Abuse: Denies Sexual Abuse: Denies Exploitation of patient/patient's resources: Denies Self-Neglect: Denies  Emotional Status Pt's affect, behavior adn adjustment status: Pt is wanting to get independent and go home not needing assist. He has alwasy been able to take care of himself and plans to do this again at discharge. His family is supportive and will assist when they are there. Recent Psychosocial Issues: HTN was not seeing a MD due to uninsured Pyschiatric History: No issues-may benefit from seeing neuro-psych while here due to young age and for coping. Will ask input from team Substance Abuse History: ETOH and tobacco aware needs to quit unsure if he plans too when he leaves the hospital. Will continue to discuss while here  Patient / Family Perceptions, Expectations & Goals Pt/Family understanding of illness & functional limitations: Pt and son have a basic understanding of his stroke, deficits and the reason it happened. Both have spoken with the MD and feel they understand the treatment plan going forward. Son and pt feel he will not leave here until he is independent./ Premorbid pt/family roles/activities: Husband, father, employee, friend, etc Anticipated changes in roles/activities/participation: resume Pt/family expectations/goals: Pt states: " Need to do for myself, no one at home."  Son states: " We will do when we are home but need to work."  [employer] Community Agencies: None Premorbid Home Care/DME Agencies: None Transportation available at discharge: [employer] Resource referrals recommended: Neuropsychology, Support group (specify)  Discharge  Planning Living Arrangements: Spouse/significant other, Children Support Systems:  Spouse/significant other, Children, Other relatives, Friends/neighbors Type of Residence: Private residence Insurance Resources: Customer service manager Resources: Employment, Garment/textile technologist Screen Referred: Yes Living Expenses: Rent Money Management: Patient Does the patient have any problems obtaining your medications?: Yes (Describe)(uninsured) Home Management: Wife Patient/Family Preliminary Plans: Return home with family who are there in the evenings, work during the day. All hope he is independent so he can be safe at home alone while they are working. Made aware he will not stay here until he is, will make every effort to get him mod/i before going home. Await therapy evaluations. Sw Barriers to Discharge: Decreased caregiver support, Other (comments) Sw Barriers to Discharge Comments: Family works days and uninsured Social Work Anticipated Follow Up Needs: HH/OP, Support Group  Clinical Impression Pleasant burmese gentleman who is willing to work hard in therapies to regain his independence. His family is supportive and involved and will assist but need to work to keep the household running. They havespoken with financial counselor here and will apply for Medicaid unsure if eligible. Will await therapy evaluations and work on a safe plan for him. Also try to connect him with a clinic for PCP follow up at discharge. Would benefit from seeing neuro-psych while here, will make referral. Will make arrangements for interpreter to be present during therapies.  Lucy Chris 10/03/2017, 12:20 PM

## 2017-10-03 NOTE — Evaluation (Signed)
Physical Therapy Assessment and Plan  Patient Details  Name: Umer Harig MRN: [id number] Date of Birth: 06/28/1961  PT Diagnosis: Ataxia, Difficulty walking, Dizziness and giddiness, Edema, Hemiplegia non-dominant, Impaired sensation, Muscle weakness and Pain in joint Rehab Potential: Good ELOS: 12-14 days   Today's Date: 10/03/2017 PT Individual Time: 0175-1025 PT Individual Time Calculation (min): 75 min    Problem List:  Patient Active Problem List   Diagnosis Date Noted  . CVA (cerebral vascular accident) ([place]) 10/02/2017  . Alcohol abuse   . Hyperlipidemia   . Ischemic stroke ([place]) 10/01/2017  . Hypertensive urgency 10/01/2017  . Cough 09/13/2014  . HTN (hypertension) 08/25/2014  . Hyperglycemia 08/25/2014  . Allergic rhinitis 08/25/2014  . Routine general medical examination at a health care facility 05/03/2014  . Insomnia 04/18/2014  . Obesity, unspecified 04/18/2014  . Smoker 05/29/2010  . SHORTNESS OF BREATH 05/15/2010  . ABDOMINAL DISTENSION 05/15/2010    Past Medical History:  Past Medical History:  Diagnosis Date  . Alcohol abuse   . HTN (hypertension) 08/25/2014  . Hyperlipidemia    Past Surgical History: History reviewed. No pertinent surgical history.  Assessment & Plan Clinical Impression: Patient is a 57 y.o. year old limited English speakingmale from [place] history of hypertension and tobacco abuse on no prescription medications. He has not seen a doctor in 2-3 years. Per chart review patient lives with hisfamily. 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 Presented 2/27/2019with left-sided weakness/fall and slurred speech. Patient hypertensive 200/120. EKG sinus rhythm with diffuse ST-ST abnormalities and no prior comparison. Troponin negative. Cranial CT scan showed subtle hypoattenuation involving the right caudate head and anterior right insular  cortex concerning for acute subacute ischemia. CT a of the head and neck negative for emergent large vessel occlusion. UDS negative. Patient did not receive TPA. MRI/MRAshowed small acute early subacute infarct within right posterior limb of internal capsule. No hemorrhage or mass effect.NoLarge vessel occlusion or aneurysm without stenosis.Currently on aspirin for CVA prophylaxis. Subcutaneous Lovenox for DVT prophylaxis. Echocardiogramshowed LVH no valvular disease, no [place].[name] a regular diet Physical therapy evaluation completed with recommendations of physical medicine rehabilitation consult. Patient was admitted for a comprehensive rehabilitation program.  Patient transferred to CIR on 10/02/2017 .   Patient currently requires mod to max with mobility secondary to muscle weakness and muscle joint tightness, decreased cardiorespiratoy endurance, impaired timing and sequencing, ataxia and decreased coordination, decreased attention to left, decreased safety awareness and decreased sitting balance, decreased standing balance, decreased postural control, hemiplegia and decreased balance strategies.  Prior to hospitalization, patient was independent  with mobility and lived with Spouse, Son in a House home.  Home access is 3Stairs to enter.  Patient will benefit from skilled PT intervention to maximize safe functional mobility, minimize fall risk and decrease caregiver burden for planned discharge home with intermittent assist.  Anticipate patient will benefit from follow up [facility] at discharge.  PT - End of Session Activity Tolerance: Decreased this session Endurance Deficit: Yes PT Assessment Rehab Potential (ACUTE/IP ONLY): Good PT Barriers to Discharge: Decreased caregiver support PT Barriers to Discharge Comments: need to confirm caregiver availablity PT Patient demonstrates impairments in the following area(s): Balance;Edema;Endurance;Motor;Perception;Safety;Sensory;Pain PT Transfers  Functional Problem(s): Bed Mobility;Bed to Chair;Car;Furniture PT Locomotion Functional Problem(s): Ambulation;Wheelchair Mobility;Stairs PT Plan PT Intensity: Minimum of 1-2 x/day ,45 to 90 minutes PT Frequency: 5 out of 7 days PT Duration Estimated  Length of Stay: 12-14 days PT Treatment/Interventions: Ambulation/gait training;Balance/vestibular training;Community reintegration;Discharge planning;Disease management/prevention;DME/adaptive equipment instruction;Functional mobility training;Neuromuscular re-education;Pain management;Patient/family education;Psychosocial support;Skin care/wound management;Splinting/orthotics;Stair training;Therapeutic Activities;Therapeutic Exercise;UE/LE Strength taining/ROM;UE/LE Coordination activities;Visual/perceptual remediation/compensation;Wheelchair propulsion/positioning PT Transfers Anticipated Outcome(s): supervision overall PT Locomotion Anticipated Outcome(s): supervision to min assist overall ambulatory level PT Recommendation Follow Up Recommendations: Home health PT Patient destination: Home Equipment Recommended: Rolling walker with 5" wheels  Skilled Therapeutic Intervention Evaluation completed (see details above and below) with education on PT POC and goals and individual treatment initiated with focus on functional transfers, initiating gait with and without AD, simulated car transfer, w/c propulsion with BUE for NMR and bimanual task (mod assist) and stair negotiation for home entry practice (mod assist to ascend but max for descending due to poor eccentric control and ability to place LLE due to ataxic movements). Pt demonstrates decreased awareness of LUE and LLE during mobility and demonstrates some impulsive behaviors requiring cues for safety. During gait without AD, required up to max assist due to poor awareness of LLE and impaired postural control. Utilized Geologist, engineering for visual feedback in parallel bars and pt able to perform with min to mod  assist and cues for maintaining wider BOS as LLE tendency for scissoring pattern forwards and backwards x 2 reps each direction. Introduced RW with L hand orthosis x 20' for trial with min to mod assist needed and increased assist needed for turning due to poor awareness of body in space and positioning of RW. Returned to bed end of session to rest with min assist and positioned with needs in reach.   PT Evaluation Precautions/Restrictions Precautions Precautions: Fall Precaution Comments: mild L inattention; L hemi; maintain systolic BP < 188 Restrictions Weight Bearing Restrictions: No    Vital Signs Therapy Vitals BP: (!) 184/110 Patient Position (if appropriate): Sitting  See DocFlow sheets for values throughout session. Monitored with systolic BP < 416 during session. Reports some dizziness at end of session and request to lay down.  Pain Reports pain in back of neck towards end of session. Positioned in bed for comfort.  Home Living/Prior Functioning Home Living Available Help at Discharge: Family;Available PRN/intermittently Type of Home: House Home Access: Stairs to enter [employer] of Steps: 3 Entrance Stairs-Rails: Right Home Layout: Two level Additional Comments: some information taken from chart though pt reports having 2 story home with bedroom on second floor (differs from chart).   Lives With: Spouse;Son Prior Function Level of Independence: Independent with basic ADLs;Independent with gait;Independent with transfers Vision/Perception  Perception Perception: Impaired Inattention/Neglect: (inattention to L side of body)  Cognition Orientation Level: Oriented X4 Behaviors: Impulsive Safety/Judgment: Impaired Comments: no interpreter present on evaluation; pt able to follow commands without difficulty but some impulsive behaviors noted with cues needed for safety Sensation Sensation Light Touch: Appears Intact Proprioception: Impaired  Detail Proprioception Impaired Details: Impaired LLE;Impaired LUE Coordination Gross Motor Movements are Fluid and Coordinated: No(L) Motor  Motor Motor: Hemiplegia;Abnormal postural alignment and control;Ataxia  Locomotion  Ambulation Ambulation: Yes Ambulation/Gait Assistance: 2: Max assist Ambulation Distance (Feet): 20 Feet Assistive device: None Ambulation/Gait Assistance Details: Tactile cues for weight shifting;Verbal cues for gait pattern;Manual facilitation for weight shifting;Verbal cues for precautions/safety;Verbal cues for sequencing Stairs / Additional Locomotion Stairs: Yes Stairs Assistance: 2: Max assist Stairs Assistance Details: Tactile cues for placement;Tactile cues for weight shifting;Tactile cues for sequencing;Verbal cues for precautions/safety;Verbal cues for gait pattern;Verbal cues for technique;Manual facilitation for placement;Manual facilitation for weight shifting Wheelchair Mobility Wheelchair Mobility: Yes Wheelchair Assistance: 3: Mod  assist Wheelchair Propulsion: Both upper extremities Distance: 50  Trunk/Postural Assessment  Cervical Assessment Cervical Assessment: Within Functional Limits Thoracic Assessment Thoracic Assessment: Within Functional Limits Lumbar Assessment Lumbar Assessment: Within Functional Limits Postural Control Postural Control: Deficits on evaluation  Balance Balance Balance Assessed: Yes Static Sitting Balance Static Sitting - Level of Assistance: 5: Stand by assistance Dynamic Sitting Balance Dynamic Sitting - Level of Assistance: 4: Min assist Static Standing Balance Static Standing - Level of Assistance: 4: Min assist Dynamic Standing Balance Dynamic Standing - Level of Assistance: 2: Max assist Extremity Assessment      RLE Assessment RLE Assessment: Within Functional Limits LLE Assessment LLE Assessment: Exceptions to WFL LLE Strength LLE Overall Strength Comments: grossly 3+ to 4-/5   See Function  Navigator for Current Functional Status.   Refer to Care Plan for Long Term Goals  Recommendations for other services: Therapeutic Recreation  Kitchen group and Outing/community reintegration  Discharge Criteria: Patient will be discharged from PT if patient refuses treatment 3 consecutive times without medical reason, if treatment goals not met, if there is a change in medical status, if patient makes no progress towards goals or if patient is discharged from hospital.  The above assessment, treatment plan, treatment alternatives and goals were discussed and mutually agreed upon: by patient  Juanna Cao, PT, DPT  10/03/2017, 11:20 AM

## 2017-10-03 NOTE — IPOC Note (Signed)
Overall Plan of Care [facility]) Patient Details Name: Willie Herrera MRN: [id number] DOB: 17-Aug-1960  Admitting Diagnosis: <principal problem not specified>  Hospital Problems: Active Problems:   CVA (cerebral vascular accident) [facility])     Functional Problem List: Nursing Safety, Medication Management, Endurance, Motor, Sensory  PT Balance, Edema, Endurance, Motor, Perception, Safety, Sensory, Pain  OT Balance, Perception, Safety, Sensory, Endurance, Motor  SLP    TR         Basic ADL's: OT Grooming, Bathing, Dressing, Toileting     Advanced  ADL's: OT Simple Meal Preparation     Transfers: PT Bed Mobility, Bed to Chair, Car, Occupational psychologist, Research scientist (life sciences): PT Ambulation, Psychologist, prison and probation services, Stairs     Additional Impairments: OT Fuctional Use of Upper Extremity  SLP        TR      Anticipated Outcomes Item Anticipated Outcome  Self Feeding n/a  Swallowing      Basic self-care  supervision   Toileting  supervision   Bathroom Transfers supervision   Bowel/Bladder  Pt will manage bowel and bladder with mod I assist at discharge.   Transfers  supervision overall  Locomotion  supervision to min assist overall ambulatory level  Communication     Cognition     Pain  Pt will manage pain at 3 or less on a scale of 0-10.   Safety/Judgment  Pt will remain free of falls with injury while in rehab with min assist/cues.   Therapy Plan: PT Intensity: Minimum of 1-2 x/day ,45 to 90 minutes PT Frequency: 5 out of 7 days PT Duration Estimated Length of Stay: 12-14 days OT Intensity: Minimum of 1-2 x/day, 45 to 90 minutes OT Frequency: 5 out of 7 days OT Duration/Estimated Length of Stay: 12-14 days      Team Interventions: Nursing Interventions Patient/Family Education, Disease Management/Prevention, Pain Management, Medication Management, Cognitive Remediation/Compensation, Discharge Planning  PT interventions Ambulation/gait training,  Balance/vestibular training, Community reintegration, Discharge planning, Disease management/prevention, DME/adaptive equipment instruction, Functional mobility training, Neuromuscular re-education, Pain management, Patient/family education, Psychosocial support, Skin care/wound management, Splinting/orthotics, Stair training, Therapeutic Activities, Therapeutic Exercise, UE/LE Strength taining/ROM, UE/LE Coordination activities, Visual/perceptual remediation/compensation, Wheelchair propulsion/positioning  OT Interventions Warden/ranger, Discharge planning, Functional electrical stimulation, Pain management, Self Care/advanced ADL retraining, Therapeutic Activities, UE/LE Coordination activities, Disease mangement/prevention, Functional mobility training, Patient/family education, Skin care/wound managment, Therapeutic Exercise, Community reintegration, DME/adaptive equipment instruction, Neuromuscular re-education, Psychosocial support, Splinting/orthotics, UE/LE Strength taining/ROM  SLP Interventions    TR Interventions    SW/CM Interventions Discharge Planning, Psychosocial Support, Patient/Family Education   Barriers to Discharge MD  Medical stability and Medication compliance  Nursing      PT Decreased caregiver support need to confirm caregiver availablity  OT      SLP      SW Decreased caregiver support, Other (comments) Family works days and uninsured   Team Discharge Planning: Destination: PT-Home ,OT- Home , SLP-  Projected Follow-up: PT-Home health PT, OT-  Outpatient OT, SLP-  Projected Equipment Needs: PT-Rolling walker with 5" wheels, OT- To be determined, SLP-  Equipment Details: PT- , OT-  Patient/family involved in discharge planning: PT- Patient,  OT-Patient, SLP-   MD ELOS: 10-14d Medical Rehab Prognosis:  Excellent Assessment:  57 y.o.right handedlimited English speakingmale from [place] [employer] [place] history of hypertension and tobacco abuse on no prescription  medications. He has not seen a doctor in 2-3 years. Per chart review patient lives with hisfamily. 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 Presented 2/27/2019with left-sided weakness/fall and slurred speech. Patient hypertensive 200/120. EKG sinus rhythm with diffuse ST-ST abnormalities and no prior comparison. Troponin negative. Cranial CT scan showed subtle hypoattenuation involving the right caudate head and anterior right insular cortex concerning for acute subacute ischemia. CT a of the head and neck negative for emergent large vessel occlusion. UDS negative. Patient did not receive TPA. MRI/MRAshowed small acute early subacute infarct within right posterior limb of internal capsule. No hemorrhage or mass effect.NoLarge vessel occlusion or aneurysm without stenosis.   Now requiring 24/7 Rehab RN,MD, as well as CIR level PT, OT and SLP.  Treatment team will focus on ADLs and mobility with goals set at supervision   See Team Conference Notes for weekly updates to the plan of care

## 2017-10-03 NOTE — Care Management Note (Signed)
[facility] Individual Statement of Services  Patient Name:  Willie Herrera  Date:  10/03/2017  Welcome to the [facility].  Our goal is to provide you with an individualized program based on your diagnosis and situation, designed to meet your specific needs.  With this comprehensive rehabilitation program, you will be expected to participate in at least 3 hours of rehabilitation therapies Monday-Friday, with modified therapy programming on the weekends.  Your rehabilitation program will include the following services:  Physical Therapy (PT), Occupational Therapy (OT), Speech Therapy (ST), 24 hour per day rehabilitation nursing, Case Management (Social Worker), Rehabilitation Medicine, Nutrition Services and Pharmacy Services  Weekly team conferences will be held on Wednesday to discuss your progress.  Your Social Worker will talk with you frequently to get your input and to update you on team discussions.  Team conferences with you and your family in attendance may also be held.  Expected length of stay: 12-14 days Overall anticipated outcome: supervision with cueing  Depending on your progress and recovery, your program may change. Your Social Worker will coordinate services and will keep you informed of any changes. Your Social Worker's name and contact numbers are listed  below.  The following services may also be recommended but are not provided by the [facility]patient [facility]:   Driving Evaluations  Home Health Rehabiltiation Services  Outpatient Rehabilitation Services  Vocational Rehabilitation   Arrangements will be made to provide these services after discharge if needed.  Arrangements include referral to agencies that provide these services.  Your insurance has been verified to be:  None Your primary doctor is:  None  Pertinent information will be shared with your doctor and your insurance company.  Social Worker:  Dossie DerBecky Zaia Carre, SW  [phone] or [phone]) [phone]  Information discussed with and copy given to patient by: Lucy Chrisupree, Chessa Barrasso G, 10/03/2017, 10:26 AM

## 2017-10-03 NOTE — H&P (Signed)
Physical Medicine and Rehabilitation Admission H&P        Chief Complaint  Patient presents with  . Weakness  : HPI: Willie Herrera is a 57 y.o. right handed limited English speaking male from [place] with history of hypertension and tobacco abuse on no prescription medications. He has not seen a doctor in 2-3 years. Per chart review patient lives with his family. 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 Family works during the day. One level home 3 steps to entry. Presented 10/01/2017 with left-sided weakness/fall and slurred speech. Patient hypertensive 200/120. EKG sinus rhythm with diffuse ST-ST abnormalities and no prior comparison. Troponin negative. Cranial CT scan showed subtle hypoattenuation involving the right caudate head and anterior right insular cortex concerning for acute subacute ischemia. CT a of the head and neck negative for emergent large vessel occlusion. UDS negative. Patient did not receive TPA. 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 No hemorrhage or mass effect.No Large vessel occlusion or aneurysm without stenosis. Currently on aspirin for CVA prophylaxis. Subcutaneous Lovenox for DVT prophylaxis. Echocardiogram showed LVH no valvular disease, no Fluckiger.[name] a regular diet Physical therapy evaluation completed with recommendations of physical medicine rehabilitation consult. Patient was admitted for a comprehensive rehabilitation program       Review of Systems  Constitutional: Negative for chills and fever.  HENT: Negative for hearing loss.   Eyes: Negative for blurred vision and double vision.  Respiratory: Negative for cough and shortness of breath.   Cardiovascular: Negative for chest pain, palpitations and leg swelling.  Gastrointestinal: Positive for constipation and nausea.  Musculoskeletal: Positive for myalgias.  Skin: Negative for rash.  Neurological:  Positive for speech change, focal weakness and headaches.  All other systems reviewed and are negative.       Past Medical History:  Diagnosis Date  . HTN (hypertension) 08/25/2014    History reviewed. No pertinent surgical history.      Family History  Problem Relation Age of Onset  . Hypertension Mother    . Stroke Maternal Grandfather      Social History:  reports that he has been smoking.  he has never used smokeless tobacco. He reports that he drinks alcohol. He reports that he does not use drugs. Allergies: No Known Allergies       Medications Prior to Admission  Medication Sig Dispense Refill  . acetaminophen (TYLENOL) 325 MG tablet Take 650 mg by mouth every 6 (six) hours as needed for mild pain or headache.      Marland Kitchen aspirin EC 325 MG tablet Take 325 mg by mouth as needed for mild pain.      . Probiotic Product (PROBIOTIC-10 PO) Take 1 tablet by mouth as needed (for stomach).          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    Functional Status:  Mobility: Bed Mobility Overal bed mobility: Needs Assistance Bed  Mobility: Supine to Sit, Sit to Supine Supine to sit: Supervision, HOB elevated Sit to supine: Supervision, HOB elevated General bed mobility comments: Increased effort to get to EOB with impaired balance in all directions initially. Able to return to supine  without difficulty.  Transfers Overall transfer level: Needs assistance Equipment used: None Transfers: Sit to/from Stand Sit to Stand: Min guard General transfer comment: Min guard to steady in standing. Stood from [employer].  Ambulation/Gait Ambulation/Gait assistance: Mod assist Ambulation Distance (Feet): 50 Feet Assistive device: 1 person hand held assist Gait Pattern/deviations: Step-to pattern, Step-through pattern, Narrow base of support, Decreased dorsiflexion - left, Steppage General Gait Details: Pt with decreased foot clearance LLE, drags it when fatigued; demonstrates steppage gait with cues to pick up LLE. Leans left. Some coordination difficulties noted as well. Cues to slow down. Gait velocity: decreased Gait velocity interpretation: Below normal speed for age/gender   ADL:   Cognition: Cognition Overall Cognitive Status: Within Functional Limits for tasks assessed Orientation Level: Oriented X4 Cognition Arousal/Alertness: Awake/alert Behavior During Therapy: WFL for tasks assessed/performed Overall Cognitive Status: Within Functional Limits for tasks assessed General Comments: A&Ox4 and able to follow simple 1-2 step commands. Difficult to fully assess due to language.   Physical Exam: Blood pressure (!) 164/99, pulse 76, temperature 98.7 F (37.1 C), temperature source Oral, resp. rate 20, height 5' 4"  (1.626 m), weight 82.6 kg (182 lb), SpO2 98 %. Physical Exam Constitutional:  57 year old right-handed male awakens easily from sleep HENT:  Mild left facial droop  Eyes:  Pupils reactive to light  Neck: Normal range of motion. Neck supple. No thyromegaly present.  Cardiovascular: Normal rate, regular rhythm and normal heart sounds.  Respiratory: Effort normal and breath sounds normal. No respiratory distress.  GI: Soft. Bowel sounds are normal. He exhibits no distension. Nontender Skin. Warm and dry Patient speaks English but on a limited basis. He could  provide his name and age. Provides date of birth. Follows simple commands  Motor strength is 5/5 in the right deltoid, bicep, tricep, grip, hip flexor, knee extensor, ankle dorsiflexor 3+ left deltoid, bicep, tricep, grip 4- left hip flexor knee extensor ankle dorsiflexor Sensation intact light touch bilateral upper and lower limbs         Lab Results Last 48 Hours        Results for orders placed or performed during the hospital encounter of 10/01/17 (from the past 48 hour(s))  CBG monitoring, ED     Status: None    Collection Time: 10/01/17  1:54 PM  Result Value Ref Range    Glucose-Capillary 97 65 - 99 mg/dL  Ethanol     Status: None    Collection Time: 10/01/17  2:15 PM  Result Value Ref Range    Alcohol, Ethyl (B) <10 <10 mg/dL      Comment:        LOWEST DETECTABLE LIMIT FOR SERUM ALCOHOL IS 10 mg/dL FOR MEDICAL PURPOSES ONLY Performed at [facility], [address] [place], [place] [address]    Protime-INR     Status: None    Collection Time: 10/01/17  2:15 PM  Result Value Ref Range    Prothrombin Time 12.4 11.4 - 15.2 seconds    INR 0.93        Comment: Performed at [facility], [address] [place], [place] [address]  APTT     Status: None    Collection Time: 10/01/17  2:15 PM  Result Value Ref Range    aPTT 30 24 - 36 seconds      Comment: Performed at [facility], [address] [place], [place] [address]  CBC     Status: Abnormal    Collection Time: 10/01/17  2:15 PM  Result Value Ref Range    WBC 9.1 4.0 - 10.5 K/uL    RBC 5.78 4.22 - 5.81 MIL/uL    Hemoglobin 14.9 13.0 - 17.0 g/dL    HCT 42.5 39.0 - 52.0 %    MCV 73.5 (L) 78.0 - 100.0 fL    MCH 25.8 (L) 26.0 - 34.0 pg    MCHC 35.1 30.0 - 36.0 g/dL    RDW 14.9 11.5 - 15.5 %    Platelets 228 150 - 400 K/uL      Comment: Performed at [facility], [address] [place], [place] [address]  Differential     Status: None    Collection Time: 10/01/17   2:15 PM  Result Value Ref Range    Neutrophils Relative % 58 %    Lymphocytes Relative 29 %    Monocytes Relative 11 %    Eosinophils Relative 2 %    Basophils Relative 0 %    Neutro Abs 5.3 1.7 - 7.7 K/uL    Lymphs Abs 2.6 0.7 - 4.0 K/uL    Monocytes Absolute 1.0 0.1 - 1.0 K/uL    Eosinophils Absolute 0.2 0.0 - 0.7 K/uL    Basophils Absolute 0.0 0.0 - 0.1 K/uL    RBC Morphology TARGET CELLS        Comment: Performed at [facility], [address] [place], [place] [address]  Comprehensive metabolic panel     Status: Abnormal    Collection Time: 10/01/17  2:15 PM  Result Value Ref Range    Sodium 133 (L) 135 - 145 mmol/L    Potassium 3.5 3.5 - 5.1 mmol/L    Chloride 100 (L) 101 - 111 mmol/L    CO2 22 22 - 32 mmol/L    Glucose, Bld 104 (H) 65 - 99 mg/dL    BUN 15 6 - 20 mg/dL    Creatinine, Ser 1.04 0.61 - 1.24 mg/dL    Calcium 8.8 (L) 8.9 - 10.3 mg/dL    Total Protein 8.6 (H) 6.5 - 8.1 g/dL    Albumin 4.2 3.5 - 5.0 g/dL    AST 32 15 - 41 U/L    ALT 29 17 - 63 U/L    Alkaline Phosphatase 71 38 - 126 U/L    Total Bilirubin 0.7 0.3 - 1.2 mg/dL    GFR calc non Af Amer >60 >60 mL/min    GFR calc Af Amer >60 >60 mL/min      Comment: (NOTE) The eGFR has been calculated using the CKD EPI equation. This calculation has not been validated in all clinical situations. eGFR's persistently <60 mL/min signify possible Chronic Kidney Disease.      Anion gap 11 5 - 15      Comment: Performed at [facility], [address] [place], [place] [address]  Troponin I     Status: None    Collection Time: 10/01/17  2:15 PM  Result Value Ref Range    Troponin I <0.03 <0.03 ng/mL      Comment: Performed at [facility], [address] [place], [place] [address]  CBG monitoring, ED  Status: Abnormal    Collection Time: 10/01/17  2:16 PM  Result Value Ref Range    Glucose-Capillary 100 (H) 65 - 99 mg/dL    Comment 1 Notify RN      Comment 2 Document in  Chart    Urine rapid drug screen (hosp performed)     Status: None    Collection Time: 10/01/17  4:00 PM  Result Value Ref Range    Opiates NONE DETECTED NONE DETECTED    Cocaine NONE DETECTED NONE DETECTED    Benzodiazepines NONE DETECTED NONE DETECTED    Amphetamines NONE DETECTED NONE DETECTED    Tetrahydrocannabinol NONE DETECTED NONE DETECTED    Barbiturates NONE DETECTED NONE DETECTED      Comment: (NOTE) DRUG SCREEN FOR MEDICAL PURPOSES ONLY.  IF CONFIRMATION IS NEEDED FOR ANY PURPOSE, NOTIFY LAB WITHIN 5 DAYS. LOWEST DETECTABLE LIMITS FOR URINE DRUG SCREEN Drug Class                     Cutoff (ng/mL) Amphetamine and metabolites    1000 Barbiturate and metabolites    200 Benzodiazepine                 027 Tricyclics and metabolites     300 Opiates and metabolites        300 Cocaine and metabolites        300 THC                            50 Performed at [facility], [address] [place], [place] [address]    Urinalysis, Routine w reflex microscopic     Status: Abnormal    Collection Time: 10/01/17  4:00 PM  Result Value Ref Range    Color, Urine YELLOW YELLOW    APPearance CLEAR CLEAR    Specific Gravity, Urine 1.010 1.005 - 1.030    pH 7.0 5.0 - 8.0    Glucose, UA NEGATIVE NEGATIVE mg/dL    Hgb urine dipstick NEGATIVE NEGATIVE    Bilirubin Urine NEGATIVE NEGATIVE    Ketones, ur NEGATIVE NEGATIVE mg/dL    Protein, ur 30 (A) NEGATIVE mg/dL    Nitrite NEGATIVE NEGATIVE    Leukocytes, UA NEGATIVE NEGATIVE      Comment: Performed at [facility], [address] [place], [place] [address]  Urinalysis, Microscopic (reflex)     Status: Abnormal    Collection Time: 10/01/17  4:00 PM  Result Value Ref Range    RBC / HPF NONE SEEN 0 - 5 RBC/hpf    WBC, UA NONE SEEN 0 - 5 WBC/hpf    Bacteria, UA RARE (A) NONE SEEN    Squamous Epithelial / LPF 0-5 (A) NONE SEEN      Comment: Performed at [facility], [address] [place], [place] [address]  Hemoglobin A1c     Status: Abnormal    Collection Time: 10/02/17  4:58 AM  Result Value Ref Range    Hgb A1c MFr Bld 6.2 (H) 4.8 - 5.6 %      Comment: (NOTE) Pre diabetes:          5.7%-6.4% Diabetes:              >6.4% Glycemic control for   <7.0% adults with diabetes      Mean Plasma Glucose 131.24 mg/dL      Comment: Performed at  [facility], [place] [address], [place], [place] [address]  Lipid panel     Status: Abnormal    Collection Time: 10/02/17  4:58 AM  Result Value Ref Range    Cholesterol 232 (H) 0 - 200 mg/dL    Triglycerides 105 <150 mg/dL    HDL 69 >40 mg/dL    Total CHOL/HDL Ratio 3.4 RATIO    VLDL 21 0 - 40 mg/dL    LDL Cholesterol 142 (H) 0 - 99 mg/dL      Comment:        Total Cholesterol/HDL:CHD Risk Coronary Heart Disease Risk Table                     Men   Women  1/2 Average Risk   3.4   3.3  Average Risk       5.0   4.4  2 X Average Risk   9.6   7.1  3 X Average Risk  23.4   11.0        Use the calculated Patient Ratio above and the CHD Risk Table to determine the patient's CHD Risk.        ATP III CLASSIFICATION (LDL):  <100     mg/dL   Optimal  100-129  mg/dL   Near or Above                    Optimal  130-159  mg/dL   Borderline  160-189  mg/dL   High  >190     mg/dL   Very High Performed at [facility] [address] [place], [place] [address]    CBC     Status: Abnormal    Collection Time: 10/02/17  4:58 AM  Result Value Ref Range    WBC 8.8 4.0 - 10.5 K/uL    RBC 5.60 4.22 - 5.81 MIL/uL    Hemoglobin 14.7 13.0 - 17.0 g/dL    HCT 43.0 39.0 - 52.0 %    MCV 76.8 (L) 78.0 - 100.0 fL    MCH 26.3 26.0 - 34.0 pg    MCHC 34.2 30.0 - 36.0 g/dL    RDW 14.6 11.5 - 15.5 %    Platelets 211 150 - 400 K/uL      Comment: Performed at [facility], [place] [address], [place], [place] [address]       Imaging Results (Last 48 hours)  Ct Angio Head W Or Wo Contrast   Result Date: 10/01/2017 CLINICAL DATA:   57 year old male with left extremity weakness beginning at 0200 hours today. EXAM: CT ANGIOGRAPHY HEAD AND NECK TECHNIQUE: Multidetector CT imaging of the head and neck was performed using the standard protocol during bolus administration of intravenous contrast. Multiplanar CT image reconstructions and MIPs were obtained to evaluate the vascular anatomy. Carotid stenosis measurements (when applicable) are obtained utilizing NASCET criteria, using the distal internal carotid diameter as the denominator. CONTRAST:  160m ISOVUE-370 IOPAMIDOL (ISOVUE-370) INJECTION 76% COMPARISON:  Head CT without contrast 1437 hours today. FINDINGS: CTA NECK Skeleton: No acute osseous abnormality identified. Cervical spine disc, endplate, and facet degeneration. Upper chest: Bilateral upper lung atelectasis. No superior mediastinal lymphadenopathy. Other neck: Negative; partially retropharyngeal course of both carotids-more so the left. No neck mass or lymphadenopathy. Aortic arch: 3 vessel arch configuration with mild arch atherosclerosis distal to the left subclavian origin. No great vessel origin atherosclerosis or stenosis. Right carotid system: Negative aside from mild  tortuosity. Widely patent right carotid bifurcation. Left carotid system: Negative aside from tortuosity. Widely patent left carotid bifurcation. Vertebral arteries: Normal proximal right subclavian artery. The right vertebral artery is non dominant and diminutive but patent from its origin to the skull base without stenosis identified. Normal proximal left subclavian artery. The left vertebral artery origin is normal (series 8, image 171). The left vertebral is dominant. The left V1 segment is tortuous with a kinked appearance, but there is otherwise no cervical left vertebral artery stenosis. CTA HEAD Posterior circulation: Dominant distal left vertebral artery primarily supplies the basilar. The distal right vertebral artery functionally terminates in PICA.  Normal left PICA origin. Patent basilar artery without stenosis. Normal SCA origins (duplicated on the right). There are fetal type bilateral PCA origins. Bilateral PCA branches are within normal limits. Anterior circulation: Both ICA siphons are patent. There is mild to moderate bilateral ICA siphon calcified plaque. No hemodynamically significant stenosis occurs on the left. Mild if any right ICA siphon stenosis in the cavernous and supraclinoid segments. The bilateral ophthalmic and posterior communicating artery origins are normal. Patent carotid termini. Normal MCA and ACA origins. Anterior communicating artery is diminutive or absent. Bilateral ACA branches are within normal limits. Left MCA M1 segment and bifurcation are patent. There is mild irregularity and stenosis at the dominant posterior left M2 origin best seen on series 11, image 19. Left MCA branches otherwise are within normal limits. The right MCA M1 segment and bifurcation are patent and appear normal. Right MCA branches are within normal limits. Venous sinuses: Patent. Anatomic variants: Dominant left vertebral artery, the right terminates in PICA. Fetal type bilateral PCA origins. Delayed phase: Age indeterminate hypodensity re-demonstrated at the head of the caudate nucleus (series 13, image 15). Hypodensity also along the anterior limb of the left external capsule appears stable. No cortically based acute infarct identified. No abnormal enhancement identified. Review of the MIP images confirms the above findings IMPRESSION: 1. Negative for emergent large vessel occlusion. No hemodynamically significant arterial stenosis in the head or neck. 2. No significant extracranial atherosclerosis. Mild to moderate intracranial atherosclerosis most apparent at the ICA siphons and left MCA bifurcation. 3. Stable CT appearance of the brain from 1437 hours today. Age indeterminate small vessel ischemia in the right basal ganglia and left deep white matter  capsules. Electronically Signed   By: Genevie Ann M.D.   On: 10/01/2017 16:09    Dg Chest 2 View   Result Date: 10/01/2017 CLINICAL DATA:  57 year old male with left side weakness beginning at 0200 hours. EXAM: CHEST  2 VIEW COMPARISON:  Chest radiographs 05/12/2015. FINDINGS: AP and lateral views of the chest. Lung volumes are stable and within normal limits. Mediastinal contours remain normal. Visualized tracheal air column is within normal limits. No pneumothorax, pleural effusion or confluent pulmonary opacity. There is bilateral pulmonary vascular congestion, but no overt edema. No acute osseous abnormality identified. Negative visible bowel gas pattern. IMPRESSION: Mild pulmonary vascular congestion without overt edema. No other acute findings. Electronically Signed   By: Genevie Ann M.D.   On: 10/01/2017 15:16    Ct Angio Neck W And/or Wo Contrast   Result Date: 10/01/2017 CLINICAL DATA:  57 year old male with left extremity weakness beginning at 0200 hours today. EXAM: CT ANGIOGRAPHY HEAD AND NECK TECHNIQUE: Multidetector CT imaging of the head and neck was performed using the standard protocol during bolus administration of intravenous contrast. Multiplanar CT image reconstructions and MIPs were obtained to evaluate the vascular anatomy. Carotid  stenosis measurements (when applicable) are obtained utilizing NASCET criteria, using the distal internal carotid diameter as the denominator. CONTRAST:  120m ISOVUE-370 IOPAMIDOL (ISOVUE-370) INJECTION 76% COMPARISON:  Head CT without contrast 1437 hours today. FINDINGS: CTA NECK Skeleton: No acute osseous abnormality identified. Cervical spine disc, endplate, and facet degeneration. Upper chest: Bilateral upper lung atelectasis. No superior mediastinal lymphadenopathy. Other neck: Negative; partially retropharyngeal course of both carotids-more so the left. No neck mass or lymphadenopathy. Aortic arch: 3 vessel arch configuration with mild arch atherosclerosis  distal to the left subclavian origin. No great vessel origin atherosclerosis or stenosis. Right carotid system: Negative aside from mild tortuosity. Widely patent right carotid bifurcation. Left carotid system: Negative aside from tortuosity. Widely patent left carotid bifurcation. Vertebral arteries: Normal proximal right subclavian artery. The right vertebral artery is non dominant and diminutive but patent from its origin to the skull base without stenosis identified. Normal proximal left subclavian artery. The left vertebral artery origin is normal (series 8, image 171). The left vertebral is dominant. The left V1 segment is tortuous with a kinked appearance, but there is otherwise no cervical left vertebral artery stenosis. CTA HEAD Posterior circulation: Dominant distal left vertebral artery primarily supplies the basilar. The distal right vertebral artery functionally terminates in PICA. Normal left PICA origin. Patent basilar artery without stenosis. Normal SCA origins (duplicated on the right). There are fetal type bilateral PCA origins. Bilateral PCA branches are within normal limits. Anterior circulation: Both ICA siphons are patent. There is mild to moderate bilateral ICA siphon calcified plaque. No hemodynamically significant stenosis occurs on the left. Mild if any right ICA siphon stenosis in the cavernous and supraclinoid segments. The bilateral ophthalmic and posterior communicating artery origins are normal. Patent carotid termini. Normal MCA and ACA origins. Anterior communicating artery is diminutive or absent. Bilateral ACA branches are within normal limits. Left MCA M1 segment and bifurcation are patent. There is mild irregularity and stenosis at the dominant posterior left M2 origin best seen on series 11, image 19. Left MCA branches otherwise are within normal limits. The right MCA M1 segment and bifurcation are patent and appear normal. Right MCA branches are within normal limits. Venous  sinuses: Patent. Anatomic variants: Dominant left vertebral artery, the right terminates in PICA. Fetal type bilateral PCA origins. Delayed phase: Age indeterminate hypodensity re-demonstrated at the head of the caudate nucleus (series 13, image 15). Hypodensity also along the anterior limb of the left external capsule appears stable. No cortically based acute infarct identified. No abnormal enhancement identified. Review of the MIP images confirms the above findings IMPRESSION: 1. Negative for emergent large vessel occlusion. No hemodynamically significant arterial stenosis in the head or neck. 2. No significant extracranial atherosclerosis. Mild to moderate intracranial atherosclerosis most apparent at the ICA siphons and left MCA bifurcation. 3. Stable CT appearance of the brain from 1437 hours today. Age indeterminate small vessel ischemia in the right basal ganglia and left deep white matter capsules. Electronically Signed   By: HGenevie AnnM.D.   On: 10/01/2017 16:09    Mr Brain Wo Contrast   Result Date: 10/02/2017 CLINICAL DATA:  57y/o  M; weakness involving left leg and left arm. EXAM: MRI HEAD WITHOUT CONTRAST MRA HEAD WITHOUT CONTRAST TECHNIQUE: Multiplanar, multiecho pulse sequences of the brain and surrounding structures were obtained without intravenous contrast. Angiographic images of the head were obtained using MRA technique without contrast. COMPARISON:  10/01/2017 CT head and CT angiogram head. FINDINGS: MRI HEAD FINDINGS Brain: 1.7 x  0.7 x 0.6 cm (volume = 0.4 cm^3) focus of reduced diffusion in the right posterior limb of internal capsule compatible with acute/early subacute infarction. No hemorrhage or mass effect. Small chronic lacunar infarcts are present within bilateral putamen and right caudate head. Fewnonspecific foci of T2 FLAIR hyperintense signal abnormality in subcortical and periventricular white matter are compatible withmildchronic microvascular ischemic changes for age.  Mildbrain parenchymal volume loss. No hydrocephalus, extra-axial collection, or effacement of basilar cisterns. Vascular: As below. Skull and upper cervical spine: Normal marrow signal. Sinuses/Orbits: Negative. Other: None. MRA HEAD FINDINGS Internal carotid arteries: Patent. Irregularity of bilateral carotid siphons compatible with atherosclerosis. Mild proximal right cavernous stenosis. Anterior cerebral arteries:  Patent. Middle cerebral arteries: Patent. Left M2 inferior division origin mild stenosis. Anterior communicating artery: Not identified, likely hypoplastic or absent. Posterior communicating arteries:  Patent.  Bilateral fetal PCA. Posterior cerebral arteries:  Patent. Basilar artery:  Patent. Vertebral arteries: Patent. Diminutive right vertebral artery terminates in right PICA. No evidence of high-grade stenosis, large vessel occlusion, or aneurysm identified. IMPRESSION: 1. Small acute/early subacute infarction within right posterior limb of internal capsule. No hemorrhage or mass effect. 2. Mild chronic microvascular ischemic changes and parenchymal volume loss of the brain. Small chronic lacunar infarctions within the basal ganglia. 3. Patent anterior and posterior intracranial circulation. No large vessel occlusion, aneurysm, or significant stenosis. These results will be called to the ordering clinician or representative by the Radiologist Assistant, and communication documented in the PACS or zVision Dashboard. Electronically Signed   By: Kristine Garbe M.D.   On: 10/02/2017 03:15    Mr Jodene Nam Head Wo Contrast   Result Date: 10/02/2017 CLINICAL DATA:  57 y/o  M; weakness involving left leg and left arm. EXAM: MRI HEAD WITHOUT CONTRAST MRA HEAD WITHOUT CONTRAST TECHNIQUE: Multiplanar, multiecho pulse sequences of the brain and surrounding structures were obtained without intravenous contrast. Angiographic images of the head were obtained using MRA technique without contrast.  COMPARISON:  10/01/2017 CT head and CT angiogram head. FINDINGS: MRI HEAD FINDINGS Brain: 1.7 x 0.7 x 0.6 cm (volume = 0.4 cm^3) focus of reduced diffusion in the right posterior limb of internal capsule compatible with acute/early subacute infarction. No hemorrhage or mass effect. Small chronic lacunar infarcts are present within bilateral putamen and right caudate head. Fewnonspecific foci of T2 FLAIR hyperintense signal abnormality in subcortical and periventricular white matter are compatible withmildchronic microvascular ischemic changes for age. Mildbrain parenchymal volume loss. No hydrocephalus, extra-axial collection, or effacement of basilar cisterns. Vascular: As below. Skull and upper cervical spine: Normal marrow signal. Sinuses/Orbits: Negative. Other: None. MRA HEAD FINDINGS Internal carotid arteries: Patent. Irregularity of bilateral carotid siphons compatible with atherosclerosis. Mild proximal right cavernous stenosis. Anterior cerebral arteries:  Patent. Middle cerebral arteries: Patent. Left M2 inferior division origin mild stenosis. Anterior communicating artery: Not identified, likely hypoplastic or absent. Posterior communicating arteries:  Patent.  Bilateral fetal PCA. Posterior cerebral arteries:  Patent. Basilar artery:  Patent. Vertebral arteries: Patent. Diminutive right vertebral artery terminates in right PICA. No evidence of high-grade stenosis, large vessel occlusion, or aneurysm identified. IMPRESSION: 1. Small acute/early subacute infarction within right posterior limb of internal capsule. No hemorrhage or mass effect. 2. Mild chronic microvascular ischemic changes and parenchymal volume loss of the brain. Small chronic lacunar infarctions within the basal ganglia. 3. Patent anterior and posterior intracranial circulation. No large vessel occlusion, aneurysm, or significant stenosis. These results will be called to the ordering clinician or representative by the Radiologist  Assistant,  and communication documented in the PACS or zVision Dashboard. Electronically Signed   By: Kristine Garbe M.D.   On: 10/02/2017 03:15    Ct Head Code Stroke Wo Contrast   Result Date: 10/01/2017 CLINICAL DATA:  Code stroke. Acute onset of left upper and lower extremity weakness beginning at 2 a.m. today, 12 hours ago. EXAM: CT HEAD WITHOUT CONTRAST TECHNIQUE: Contiguous axial images were obtained from the base of the skull through the vertex without intravenous contrast. COMPARISON:  None. FINDINGS: Brain: Subtle hypoattenuation is present in the right caudate head and anterior right insular cortex. Asymmetric white matter hypoattenuation is present in the left external capsule. Other scattered subcortical white matter hypoattenuation is present. No other focal cortical abnormality is present. No acute hemorrhage or mass lesion is present. The ventricles are of normal size. No significant extra-axial fluid collection is present. Vascular: Atherosclerotic calcifications are present within the cavernous internal carotid arteries bilaterally. There is no hyperdense vessel. : Calvarium is intact. No focal lytic or blastic lesions are present. Sinuses/Orbits: The paranasal sinuses and mastoid air cells are clear. Visualized globes and orbits are within normal limits. Other: ASPECTS ([place] Stroke Program Early CT Score) - Ganglionic level infarction (caudate, lentiform nuclei, internal capsule, insula, M1-M3 cortex): 5/7 - Supraganglionic infarction (M4-M6 cortex): 3/3 Total score (0-10 with 10 being normal): 10/10 IMPRESSION: 1. Subtle hypoattenuation involving the right caudate head and anterior right insular cortex is concerning for acute/subacute ischemia. 2. Mild white matter disease otherwise. This likely reflects the sequela of chronic microvascular ischemia. 3. ASPECTS is 8/10 These results were called by telephone at the time of interpretation on 10/01/2017 at 2:40 pm to Dr. Orlie Dakin , who verbally acknowledged these results. Electronically Signed   By: San Morelle M.D.   On: 10/01/2017 14:42             Medical Problem List and Plan: 1.  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  DVT Prophylaxis/Anticoagulation: Subcutaneous Lovenox. Monitor for bleeding episodes 3. Pain Management/headaches Hydrocodone as needed- no current pain 4. Mood: Provide emotional support 5. Neuropsych: This patient is capable of making decisions on his own behalf. 6. Skin/Wound Care: Routine skin checks 7. Fluids/Electrolytes/Nutrition: Routine I&O's with follow-up chemistries 8. Hypertension. Permissive hypertension.Goal < 220/135mHg, start antihypertensives in am  9. Tobacco abuse. NicoDerm patch. Provide counseling 10. Hyperlipidemia. Lipitor        Post Admission Physician Evaluation: 1. Functional deficits secondary  to Left hemiparesis. 2. Patient is admitted to receive collaborative, interdisciplinary care between the physiatrist, rehab nursing staff, and therapy team. 3. Patient's level of medical complexity and substantial therapy needs in context of that medical necessity cannot be provided at a lesser intensity of care such as a SNF. 4. Patient has experienced substantial functional loss from his/her baseline which was documented above under the "Functional History" and "Functional Status" headings.  Judging by the patient's diagnosis, physical exam, and functional history, the patient has potential for functional progress which will result in measurable gains while on inpatient rehab.  These gains will be of substantial and practical use upon discharge  in facilitating mobility and self-care at the household level. 5. Physiatrist will provide 24 hour management of medical needs as well as oversight of the therapy plan/treatment and provide guidance as appropriate regarding the interaction of the  two. 6. The Preadmission Screening has been reviewed and patient status is unchanged unless otherwise stated above. 7. 24 hour  rehab nursing will assist with bladder management, bowel management, safety, skin/wound care, disease management, medication administration, pain management and patient education  and help integrate therapy concepts, techniques,education, etc. 8. PT will assess and treat for/with: pre gait, gait training, endurance , safety, equipment, neuromuscular re education.   Goals are: Mod I/ Sup. 9. OT will assess and treat for/with: ADLs, Cognitive perceptual skills, Neuromuscular re education, safety, endurance, equipment.   Goals are: Mod I /S. Therapy may proceed with showering this patient. 10. SLP will assess and treat for/with: NA.  Goals are: NA. 11. Case Management and Social Worker will assess and treat for psychological issues and discharge planning. 12. Team conference will be held weekly to assess progress toward goals and to determine barriers to discharge. 13. Patient will receive at least 3 hours of therapy per day at least 5 days per week. 14. ELOS: 9-13d       15. Prognosis:  excellent         Charlett Blake M.D. [facility] FAAPM&R (Sports Med, Neuromuscular Med) Diplomate Am Board of Electrodiagnostic Med  Elizabeth Sauer 10/02/2017

## 2017-10-03 NOTE — Progress Notes (Signed)
BP-200/121 (sitting) after returning from [facility]. Paged Jesusita Okaan, PA made aware, no new orders at this time.Willie MartinezMurray, Willie Herrera

## 2017-10-03 NOTE — Evaluation (Signed)
Occupational Therapy Assessment and Plan  Patient Details  Name: Willie Herrera MRN: [id number] Date of Birth: 11-Dec-1960  OT Diagnosis: hemiplegia affecting non-dominant side and muscle weakness (generalized) Rehab Potential: Rehab Potential (ACUTE ONLY): Good ELOS: 12-14 days   Today's Date: 10/03/2017 OT Individual Time: 1100-1120 OT Individual Time Calculation (min): 20 min     Problem List:  Patient Active Problem List   Diagnosis Date Noted  . CVA (cerebral vascular accident) ([place]) 10/02/2017  . Alcohol abuse   . Hyperlipidemia   . Ischemic stroke ([place]) 10/01/2017  . Hypertensive urgency 10/01/2017  . Cough 09/13/2014  . HTN (hypertension) 08/25/2014  . Hyperglycemia 08/25/2014  . Allergic rhinitis 08/25/2014  . Routine general medical examination at a health care facility 05/03/2014  . Insomnia 04/18/2014  . Obesity, unspecified 04/18/2014  . Smoker 05/29/2010  . SHORTNESS OF BREATH 05/15/2010  . ABDOMINAL DISTENSION 05/15/2010    Past Medical History:  Past Medical History:  Diagnosis Date  . Alcohol abuse   . HTN (hypertension) 08/25/2014  . Hyperlipidemia    Past Surgical History: History reviewed. No pertinent surgical history.  Assessment & Plan Clinical Impression: Patient is a 57 y.o. year old male right handedlimited English speakingmale from [place] history of hypertension and tobacco abuse on no prescription medications. He has not seen a doctor in 2-3 years. Per chart review patient lives with hisfamily. 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 Presented 2/27/2019with left-sided weakness/fall and slurred speech. Patient hypertensive 200/120. EKG sinus rhythm with diffuse ST-ST abnormalities and no prior comparison. Troponin negative. Cranial CT scan showed subtle hypoattenuation involving the right caudate head and anterior right insular cortex  concerning for acute subacute ischemia. CT a of the head and neck negative for emergent large vessel occlusion. UDS negative. Patient did not receive TPA. MRI/MRAshowed small acute early subacute infarct within right posterior limb of internal capsule. No hemorrhage or mass effect.NoLarge vessel occlusion or aneurysm without stenosis.Currently on aspirin for CVA prophylaxis. Subcutaneous Lovenox for DVT prophylaxis. Echocardiogramshowed LVH no valvular disease, no Simonet.[name] a regular diet = Patient transferred to CIR on 10/02/2017 .    Patient currently requires mod with basic self-care skills and basic mobiltiy  secondary to muscle weakness, decreased cardiorespiratoy endurance, impaired timing and sequencing, unbalanced muscle activation, ataxia, decreased coordination and decreased motor planning, decreased attention to left, decreased safety awareness and decreased sitting balance, decreased standing balance, hemiplegia and decreased balance strategies.  Prior to hospitalization, patient could complete ADL with independent .  Patient will benefit from skilled intervention to decrease level of assist with basic self-care skills and increase independence with basic self-care skills prior to discharge home with care partner.  Anticipate patient will require intermittent supervision and follow up outpatient.  OT - End of Session Activity Tolerance: Tolerates 10 - 20 min activity with multiple rests Endurance Deficit: Yes OT Assessment Rehab Potential (ACUTE ONLY): Good OT Patient demonstrates impairments in the following area(s): Balance;Perception;Safety;Sensory;Endurance;Motor OT Basic ADL's Functional Problem(s): Grooming;Bathing;Dressing;Toileting OT Advanced ADL's Functional Problem(s): Simple Meal Preparation OT Transfers Functional Problem(s): Toilet;Tub/Shower OT Additional Impairment(s): Fuctional Use of Upper Extremity OT Plan OT Intensity: Minimum of 1-2 x/day, 45 to 90  minutes OT Frequency: 5 out of 7 days OT Duration/Estimated Length of Stay: 12-14 days OT Treatment/Interventions: Balance/vestibular training;Discharge planning;Functional electrical stimulation;Pain management;Self Care/advanced ADL retraining;Therapeutic Activities;UE/LE Coordination activities;Disease mangement/prevention;Functional mobility training;Patient/family education;Skin care/wound managment;Therapeutic Exercise;Community reintegration;DME/adaptive equipment  instruction;Neuromuscular re-education;Psychosocial support;Splinting/orthotics;UE/LE Strength taining/ROM OT Self Feeding Anticipated Outcome(s): n/a OT Basic Self-Care Anticipated Outcome(s): supervision  OT Toileting Anticipated Outcome(s): supervision OT Bathroom Transfers Anticipated Outcome(s): supervision  OT Recommendation Patient destination: Home Follow Up Recommendations: Outpatient OT Equipment Recommended: To be determined   Skilled Therapeutic Intervention OT eval initiated with OT purpose, role and goals discussed. Pt with elevated BP to 193/124 in sitting EOB and reports dizziness and requested to lay down instead of out of bed activity at this time. Pt tearful about current status of left UE. Eval completed.  13:00-14:00  2nd session 1:1 no pain BP still elevated but feeling better. Engaged in self care retraining at shower level with focus on transfer training, sit to stands, functional use of left UE in all tasks, standing balance without UE support. Pt with strong lean to the left in standing when performing clothing management. Assisted with pt shaving at the sink. Pt engaged in eating with setup including cutting up food and adding condiments. Pt left sitting up in the w/c.  Interpreter present for second session.    OT Evaluation Precautions/Restrictions  Precautions Precautions: Fall Precaution Comments: mild L inattention; L hemi; maintain systolic BP < 492 Restrictions Weight Bearing  Restrictions: No General Chart Reviewed: Yes OT Amount of Missed Time: 40 Minutes Family/Caregiver Present: No Vital Signs Therapy Vitals Temp: 97.8 F (36.6 C) Temp Source: Oral Pulse Rate: 73 Resp: 18 BP: (!) 129/118 Patient Position (if appropriate): Sitting Oxygen Therapy SpO2: 100 % O2 Device: Room Air Pain  no p ain in either session (dizziness in the first session ) Home Living/Prior [place] expects to be discharged to:: Private residence Living Arrangements: Spouse/significant other, Children Available Help at Discharge: Family, Available PRN/intermittently Type of Home: House Home Access: Stairs to enter Technical brewer of Steps: 3 Entrance Stairs-Rails: Right Home Layout: Two level Bathroom Shower/Tub: Tub/shower unit Additional Comments: some information taken from chart though pt reports having 2 story home with bedroom on second floor (differs from chart).   Lives With: Spouse, Son Prior Function Level of Independence: Independent with basic ADLs, Independent with gait, Independent with transfers ADL ADL ADL Comments: see functional navigator Vision Baseline Vision/History: No visual deficits Patient Visual Report: No change from baseline Vision Assessment?: No apparent visual deficits Perception  Perception: Impaired Inattention/Neglect: Does not attend to left side of body Praxis Praxis: Impaired Praxis Impairment Details: Motor planning Cognition Overall Cognitive Status: Within Functional Limits for tasks assessed Arousal/Alertness: Awake/alert Orientation Level: Person;Place;Situation Person: Oriented Place: Oriented Situation: Oriented Year: 2019 Month: March Day of Week: Correct Memory: Appears intact Immediate Memory Recall: Sock;Blue;Bed Memory Recall: Blue;Bed;Sock Memory Recall Sock: Without Cue Memory Recall Blue: Without Cue Memory Recall Bed: Without Cue Attention: Selective Selective  Attention: Appears intact Awareness: Impaired Awareness Impairment: Emergent impairment Problem Solving: Appears intact Behaviors: Impulsive Safety/Judgment: Impaired Comments: no interpreter present on evaluation; pt able to follow commands without difficulty but some impulsive behaviors noted with cues needed for safety Sensation Sensation Light Touch: Appears Intact Proprioception: Impaired Detail Proprioception Impaired Details: Impaired LLE;Impaired LUE Coordination Gross Motor Movements are Fluid and Coordinated: No Fine Motor Movements are Fluid and Coordinated: No Finger Nose Finger Test: demonstrated ataxia Motor  Motor Motor: Hemiplegia;Abnormal postural alignment and control;Ataxia Mobility  Transfers Transfers: Sit to Stand;Stand to Sit Sit to Stand: 3: Mod assist Stand to Sit: 4: Min assist  Trunk/Postural Assessment  Cervical Assessment Cervical Assessment: Within Functional Limits Thoracic Assessment Thoracic Assessment: Within Functional Limits Lumbar  Assessment Lumbar Assessment: Within Functional Limits Postural Control Postural Control: Deficits on evaluation Righting Reactions: delayed  Balance Static Sitting Balance Static Sitting - Level of Assistance: 5: Stand by assistance Dynamic Sitting Balance Dynamic Sitting - Level of Assistance: 4: Min assist Static Standing Balance Static Standing - Level of Assistance: 4: Min assist Dynamic Standing Balance Dynamic Standing - Level of Assistance: 2: Max assist Extremity/Trunk Assessment RUE Assessment RUE Assessment: Within Functional Limits LUE Assessment LUE Assessment: Exceptions to [facility] LUE Strength LUE Overall Strength Comments: [place] V LUE Tone LUE Tone: Within Functional Limits   See Function Navigator for Current Functional Status.   Refer to Care Plan for Long Term Goals  Recommendations for other services: None    Discharge Criteria: Patient will be discharged from OT if  patient refuses treatment 3 consecutive times without medical reason, if treatment goals not met, if there is a change in medical status, if patient makes no progress towards goals or if patient is discharged from hospital.  The above assessment, treatment plan, treatment alternatives and goals were discussed and mutually agreed upon: by patient  Nicoletta Ba 10/03/2017, 2:36 PM

## 2017-10-04 ENCOUNTER — Inpatient Hospital Stay (HOSPITAL_COMMUNITY): Payer: Self-pay

## 2017-10-04 DIAGNOSIS — I63311 Cerebral infarction due to thrombosis of right middle cerebral artery: Secondary | ICD-10-CM

## 2017-10-04 MED ORDER — MUSCLE RUB 10-15 % EX CREA
TOPICAL_CREAM | Freq: Two times a day (BID) | CUTANEOUS | Status: DC | PRN
  Filled 2017-10-04: qty 85

## 2017-10-04 MED ORDER — TROLAMINE SALICYLATE 10 % EX CREA
TOPICAL_CREAM | Freq: Two times a day (BID) | CUTANEOUS | Status: DC | PRN
  Filled 2017-10-04: qty 85

## 2017-10-04 MED ORDER — HYDROCHLOROTHIAZIDE 10 MG/ML ORAL SUSPENSION
6.2500 mg | Freq: Every day | ORAL | Status: DC
  Administered 2017-10-04 – 2017-10-05 (×2): 6.25 mg via ORAL
  Filled 2017-10-04 (×3): qty 1.25

## 2017-10-04 MED ORDER — SENNOSIDES-DOCUSATE SODIUM 8.6-50 MG PO TABS
2.0000 | ORAL_TABLET | Freq: Two times a day (BID) | ORAL | Status: DC
  Administered 2017-10-04 – 2017-10-15 (×19): 2 via ORAL
  Filled 2017-10-04 (×22): qty 2

## 2017-10-04 NOTE — Progress Notes (Signed)
NT informed nurse that when she had walked into room to take patient to restroom, patients bed alarm had been turned off. Patient later was ambulating in room with family without calling for staff assistance. Patient and family educated regarding importance of bed alarm remaining on at all times, and allowing staff to ambulate patient only. Will continue to monitor.

## 2017-10-04 NOTE — Progress Notes (Signed)
Subjective/Complaints: Has some left shoulder pain at night patient states that it feels stiff. Review of systems denies chest pain shortness of breath, nausea, vomiting, diarrhea, positive, constipation  Objective: Vital Signs: Blood pressure (!) 186/105, pulse 74, temperature 98.1 F (36.7 C), temperature source Oral, resp. rate 18, height 5' 4"  (1.626 m), weight 84.7 kg (186 lb 11.7 oz), SpO2 97 %. No results found. Results for orders placed or performed during the hospital encounter of 10/02/17 (from the past 72 hour(s))  Creatinine, serum     Status: Abnormal   Collection Time: 10/02/17  9:22 PM  Result Value Ref Range   Creatinine, Ser 1.25 (H) 0.61 - 1.24 mg/dL   GFR calc non Af Amer >60 >60 mL/min   GFR calc Af Amer >60 >60 mL/min    Comment: (NOTE) The eGFR has been calculated using the CKD EPI equation. This calculation has not been validated in all clinical situations. eGFR's persistently <60 mL/min signify possible Chronic Kidney Disease. Performed at [facility], [place] [address], [place], [place] [address]   CBC WITH DIFFERENTIAL     Status: Abnormal   Collection Time: 10/03/17  6:41 AM  Result Value Ref Range   WBC 8.6 4.0 - 10.5 K/uL   RBC 5.95 (H) 4.22 - 5.81 MIL/uL   Hemoglobin 15.5 13.0 - 17.0 g/dL   HCT 46.1 39.0 - 52.0 %   MCV 77.5 (L) 78.0 - 100.0 fL   MCH 26.1 26.0 - 34.0 pg   MCHC 33.6 30.0 - 36.0 g/dL   RDW 15.0 11.5 - 15.5 %   Platelets 194 150 - 400 K/uL   Neutrophils Relative % 50 %   Neutro Abs 4.3 1.7 - 7.7 K/uL   Lymphocytes Relative 38 %   Lymphs Abs 3.3 0.7 - 4.0 K/uL   Monocytes Relative 8 %   Monocytes Absolute 0.7 0.1 - 1.0 K/uL   Eosinophils Relative 3 %   Eosinophils Absolute 0.3 0.0 - 0.7 K/uL   Basophils Relative 1 %   Basophils Absolute 0.0 0.0 - 0.1 K/uL    Comment: Performed at [facility] [address] [place], [place] [address]  Comprehensive metabolic panel     Status: Abnormal   Collection Time: 10/03/17   6:41 AM  Result Value Ref Range   Sodium 136 135 - 145 mmol/L   Potassium 3.6 3.5 - 5.1 mmol/L   Chloride 102 101 - 111 mmol/L   CO2 23 22 - 32 mmol/L   Glucose, Bld 110 (H) 65 - 99 mg/dL   BUN 12 6 - 20 mg/dL   Creatinine, Ser 1.14 0.61 - 1.24 mg/dL   Calcium 9.1 8.9 - 10.3 mg/dL   Total Protein 8.2 (H) 6.5 - 8.1 g/dL   Albumin 3.9 3.5 - 5.0 g/dL   AST 33 15 - 41 U/L   ALT 31 17 - 63 U/L   Alkaline Phosphatase 69 38 - 126 U/L   Total Bilirubin 0.7 0.3 - 1.2 mg/dL   GFR calc non Af Amer >60 >60 mL/min   GFR calc Af Amer >60 >60 mL/min    Comment: (NOTE) The eGFR has been calculated using the CKD EPI equation. This calculation has not been validated in all clinical situations. eGFR's persistently <60 mL/min signify possible Chronic Kidney Disease.    Anion gap 11 5 - 15    Comment: Performed at [facility] [address], [place], [place] [address]     HEENT:  normal Cardio: RRR and no murmur Resp: CTA B/L and unlabored GI: BS positive and NT, ND Extremity:  Pulses positive and No Edema Skin:   Intact Neuro: Alert/Oriented, Normal Sensory, Abnormal Motor 3/5 L deltoid, bicep, tricep, grip, 4/5 in left hip flexor knee extensor ankle dorsiflexor 5/5 in right upper and right lower limb and Abnormal FMC Ataxic/ dec FMC Musc/Skel:  Normal and Swelling Left dorsum of hand General no acute distress   Assessment/Plan: 1. 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 Physiatrist is providing close team supervision and 24 hour management of active medical problems listed below. Physiatrist and rehab team continue to assess barriers to discharge/monitor patient progress toward functional and medical goals. FIM: Function - Bathing Position: Shower Body parts bathed by patient: Right arm, Left arm, Chest,  Abdomen, Front perineal area, Buttocks, Right upper leg, Left upper leg Body parts bathed by helper: Back, Left lower leg, Right lower leg Assist Level: Touching or steadying assistance(Pt > 75%)  Function- Upper Body Dressing/Undressing What is the patient wearing?: Hospital gown Function - Lower Body Dressing/Undressing What is the patient wearing?: Hospital Gown, Non-skid slipper socks Non-skid slipper socks- Performed by helper: Don/doff right sock, Don/doff left sock Assist for footwear: Partial/moderate assist Assist for lower body dressing: Touching or steadying assistance (Pt > 75%)  Function - Toileting Toileting steps completed by patient: Adjust clothing prior to toileting, Performs perineal hygiene, Adjust clothing after toileting Toileting Assistive Devices: Grab bar or rail Assist level: Touching or steadying assistance (Pt.75%)  Function - Air cabin crew transfer assistive device: Grab bar Assist level to toilet: Touching or steadying assistance (Pt > 75%) Assist level from toilet: Touching or steadying assistance (Pt > 75%)  Function - Chair/bed transfer Chair/bed transfer method: Stand pivot Chair/bed transfer assist level: Maximal assist (Pt 25 - 49%/lift and lower) Chair/bed transfer assistive device: Armrests  Function - Locomotion: Wheelchair Type: Manual Max wheelchair distance: 50' Assist Level: Moderate assistance (Pt 50 - 74%) Assist Level: Moderate assistance (Pt 50 - 74%) Wheel 150 feet activity did not occur: Safety/medical concerns Turns around,maneuvers to table,bed, and toilet,negotiates 3% grade,maneuvers on rugs and over doorsills: No Function - Locomotion: Ambulation Assistive device: No device Max distance: 20' Assist level: Maximal assist (Pt 25 - 49%) Assist level: Maximal assist (Pt 25 - 49%) Walk 50 feet with 2 turns activity did not occur: Safety/medical concerns Walk 150 feet activity did not occur: Safety/medical  concerns Walk 10 feet on uneven surfaces activity did not occur: Safety/medical concerns  Function - Comprehension Comprehension: Auditory Comprehension assist level: Understands basic 90% of the time/cues < 10% of the time  Function - Expression Expression: Verbal Expression assist level: Expresses basic needs/ideas: With no assist  Function - Social Interaction Social Interaction assist level: Interacts appropriately with others - No medications needed.  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 DVT Prophylaxis/Anticoagulation: Subcutaneous Lovenox. Monitor for bleeding episodes 3. Pain Management/headachesHydrocodone as needed-left shoulder stiffness at night and K  pad as well as Sportscreme 4. Mood:Provide emotional support 5. Neuropsych: This patientiscapable of making decisions on hisown behalf. 6. Skin/Wound Care:Routineskin checks 7. Fluids/Electrolytes/Nutrition:Routine I&O's with follow-up chemistries 8.Hypertension. Permissive hypertension.Goal < 220/166mHg, reviewed primary care notes had been on hydrochlorothiazide with good control in the past will resume 9.Tobacco abuse. NicoDerm patch. Provide counseling 10.Hyperlipidemia. Lipitor    LOS (Days) 2 A FACE TO FACE EVALUATION WAS PERFORMED  ACharlett Blake3/09/2017, 10:59 AM

## 2017-10-04 NOTE — Progress Notes (Signed)
Physical Therapy Session Note  Patient Details  Name: Willie Herrera MRN: [id number] Date of Birth: 04/28/1961  Today's Date: 10/04/2017 PT Individual Time: 1031-1130 PT Individual Time Calculation (min): 59 min   Short Term Goals: Week 1:  PT Short Term Goal 1 (Week 1): Pt will be able to demonstrate functional transfers with min assist PT Short Term Goal 2 (Week 1): Pt will be able to gait x 50' with LRAD with min assist PT Short Term Goal 3 (Week 1): Pt will be able to demonstrate functional dynamic standing balance with min assist  Skilled Therapeutic Interventions/Progress Updates:    Pt supine in bed upon PT arrival, agreeable to therapy tx and denies pain.   Pt transferred supine>sitting with min assist, verbal cues for use of L side. Pt transferred from from bed>w/c stand pivot with mod assist, verbal/tactile cues for L attention, L knee buckling noted. Pt transported from room>gym in w/c. Pt transferred from w/c>mat stand pivot with mod assist. Pt performed 2 x 10 sit<>stands with emphasis on symmetric LE weightbearing and L knee control for neuro re-ed. Pt ambulated x 80 ft with RW, L hand orthosis and mod assist, verbal cues for L attention, pt veers to the R during ambulation, verbal/tactile cues for L foot placement with ataxia noted, tactile cues to limit L knee hyperextension. BP 161/107 after activity. Pt worked on dynamic standing balance and L limb loading while standing with R LE on 2 inch step, increased L lateral lean with multimodal cues to correct, pt reaching with L UE to toss bean bags. Pt performed x 10 sit<>stands with R LE on 2 inch step for increased L LE loading and neuro re-ed, min assist. Pt worked on dynamic standing balance, coordination and postural control to perform toe taps to targets on 6 inch step, x 2 trials, mod assist. Pt worked on pre-gait stepping forward/backward in Radiation protection practitionerplace using mirror for visual feedback, no UE support, verbal/manual facilitation for  anteriolateral weightshifting during stepping. Pt ambulated x 50 ft without AD, max assist, verbal cues for placement, techniques and manual facilitation for lateral weightshifting. Pt transported back to room and transferred to bed mod assist, left supine in bed with needs in reach and bed alarm set.   Therapy Documentation Precautions:  Precautions Precautions: Fall Precaution Comments: mild L inattention; L hemi; maintain systolic BP < 220 Restrictions Weight Bearing Restrictions: No   See Function Navigator for Current Functional Status.   Therapy/Group: Individual Therapy  Cresenciano GenreEmily van Schagen, PT, DPT 10/04/2017, 10:46 AM

## 2017-10-05 ENCOUNTER — Inpatient Hospital Stay (HOSPITAL_COMMUNITY): Payer: Self-pay | Admitting: Occupational Therapy

## 2017-10-05 ENCOUNTER — Inpatient Hospital Stay (HOSPITAL_COMMUNITY): Payer: Self-pay | Admitting: Physical Therapy

## 2017-10-05 MED ORDER — TRAZODONE HCL 50 MG PO TABS
50.0000 mg | ORAL_TABLET | Freq: Every evening | ORAL | Status: DC | PRN
  Administered 2017-10-05 – 2017-10-08 (×4): 50 mg via ORAL
  Filled 2017-10-05 (×4): qty 1

## 2017-10-05 MED ORDER — HYDROCHLOROTHIAZIDE 12.5 MG PO CAPS
12.5000 mg | ORAL_CAPSULE | Freq: Every day | ORAL | Status: DC
  Administered 2017-10-06 – 2017-10-09 (×4): 12.5 mg via ORAL
  Filled 2017-10-05 (×4): qty 1

## 2017-10-05 NOTE — Progress Notes (Signed)
Physical Therapy Session Note  Patient Details  Name: Willie Herrera MRN: [id number] Date of Birth: 02/10/1961  Today's Date: 10/05/2017 PT Individual Time: 1000-1054 PT Individual Time Calculation (min): 54 min   Short Term Goals: Week 1:  PT Short Term Goal 1 (Week 1): Pt will be able to demonstrate functional transfers with min assist PT Short Term Goal 2 (Week 1): Pt will be able to gait x 50' with LRAD with min assist PT Short Term Goal 3 (Week 1): Pt will be able to demonstrate functional dynamic standing balance with min assist  Skilled Therapeutic Interventions/Progress Updates:    no c/o pain but reports fatigue.  Session focus on gait training, balance, and LUE NMR.    Pt transitions to EOB with supervision.  Min assist and min verbal cues for safety for stand/pivot to w/c on L.  Pt transitions sit<>stand from w/c throughout session with RW and supervision, with verbal cues for hand placement and squaring up to surface prior to sitting.  Gait training x80' with RW and overall mod assist, occasional decreased L foot clearance due to large shoes and decreased DF.  PT applied stiff PLS AFO with lateral strut, pt ambulated 40' with c/o feeling pressure from the AFO.  Switched for PLS with medial strut with improvement in tolerance and improved LE rotation following heel strike.  Seated card matching task focus on dynamic balance with forward reach to collect card with RUE and match to corresponding card.  Pt requires increased time, and occasional min assist/verbal cues for safety with forward lean.  Returned to room at end of session and pt requesting to return to bed.  Min assist for stand/pivot to bed on R.  Call bell in reach and alarm activated.   Therapy Documentation Precautions:  Precautions Precautions: Fall Precaution Comments: mild L inattention; L hemi; maintain systolic BP < 220 Restrictions Weight Bearing Restrictions: No   See Function Navigator for Current Functional  Status.   Therapy/Group: Individual Therapy  Stephania FragminCaitlin E Kaliel Bolds 10/05/2017, 10:55 AM

## 2017-10-05 NOTE — Progress Notes (Signed)
Subjective/Complaints: Per nursing staff patient got up with family to go to the shower.  Interpreter in room today.  I explained to patient that he is not allowed to get up without staff which would be either nursing or therapy.  We discussed fall risk. Patient has had poor sleep last night he was able to sleep well with a sleep medication prior to coming to the rehab unit. Review of systems denies chest pain shortness of breath, nausea, vomiting, diarrhea, positive, constipation  Objective: Vital Signs: Blood pressure (!) 154/90, pulse 97, temperature 98 F (36.7 C), temperature source Oral, resp. rate 16, height 5' 4"  (1.626 m), weight 84.7 kg (186 lb 11.7 oz), SpO2 98 %. No results found. Results for orders placed or performed during the hospital encounter of 10/02/17 (from the past 72 hour(s))  Creatinine, serum     Status: Abnormal   Collection Time: 10/02/17  9:22 PM  Result Value Ref Range   Creatinine, Ser 1.25 (H) 0.61 - 1.24 mg/dL   GFR calc non Af Amer >60 >60 mL/min   GFR calc Af Amer >60 >60 mL/min    Comment: (NOTE) The eGFR has been calculated using the CKD EPI equation. This calculation has not been validated in all clinical situations. eGFR's persistently <60 mL/min signify possible Chronic Kidney Disease. Performed at [facility], [place] [address], [place], [place] [address]   CBC WITH DIFFERENTIAL     Status: Abnormal   Collection Time: 10/03/17  6:41 AM  Result Value Ref Range   WBC 8.6 4.0 - 10.5 K/uL   RBC 5.95 (H) 4.22 - 5.81 MIL/uL   Hemoglobin 15.5 13.0 - 17.0 g/dL   HCT 46.1 39.0 - 52.0 %   MCV 77.5 (L) 78.0 - 100.0 fL   MCH 26.1 26.0 - 34.0 pg   MCHC 33.6 30.0 - 36.0 g/dL   RDW 15.0 11.5 - 15.5 %   Platelets 194 150 - 400 K/uL   Neutrophils Relative % 50 %   Neutro Abs 4.3 1.7 - 7.7 K/uL   Lymphocytes Relative 38 %   Lymphs Abs 3.3 0.7 - 4.0 K/uL   Monocytes Relative 8 %   Monocytes Absolute 0.7 0.1 - 1.0 K/uL   Eosinophils Relative 3 %   Eosinophils Absolute 0.3 0.0 - 0.7 K/uL   Basophils Relative 1 %   Basophils Absolute 0.0 0.0 - 0.1 K/uL    Comment: Performed at [facility] [address], [place], [place] [address]  Comprehensive metabolic panel     Status: Abnormal   Collection Time: 10/03/17  6:41 AM  Result Value Ref Range   Sodium 136 135 - 145 mmol/L   Potassium 3.6 3.5 - 5.1 mmol/L   Chloride 102 101 - 111 mmol/L   CO2 23 22 - 32 mmol/L   Glucose, Bld 110 (H) 65 - 99 mg/dL   BUN 12 6 - 20 mg/dL   Creatinine, Ser 1.14 0.61 - 1.24 mg/dL   Calcium 9.1 8.9 - 10.3 mg/dL   Total Protein 8.2 (H) 6.5 - 8.1 g/dL   Albumin 3.9 3.5 - 5.0 g/dL   AST 33 15 - 41 U/L   ALT 31 17 - 63 U/L   Alkaline Phosphatase 69 38 - 126 U/L   Total Bilirubin 0.7 0.3 - 1.2 mg/dL   GFR calc non Af Amer >60 >60 mL/min   GFR calc Af Amer >60 >60 mL/min    Comment: (NOTE) The eGFR has been calculated  using the CKD EPI equation. This calculation has not been validated in all clinical situations. eGFR's persistently <60 mL/min signify possible Chronic Kidney Disease.    Anion gap 11 5 - 15    Comment: Performed at [facility] [address], [place], [place] [address]     HEENT: normal Cardio: RRR and no murmur Resp: CTA B/L and unlabored GI: BS positive and NT, ND Extremity:  Pulses positive and No Edema Skin:   Intact Neuro: Alert/Oriented, Normal Sensory, Abnormal Motor 3/5 L deltoid, bicep, tricep, grip, 4/5 in left hip flexor knee extensor ankle dorsiflexor 5/5 in right upper and right lower limb and Abnormal FMC Ataxic/ dec FMC Musc/Skel:  Normal and Swelling Left dorsum of hand General no acute distress   Assessment/Plan: 1. 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 Physiatrist is providing close team supervision and 24 hour management of  active medical problems listed below. Physiatrist and rehab team continue to assess barriers to discharge/monitor patient progress toward functional and medical goals. FIM: Function - Bathing Position: Shower Body parts bathed by patient: Right arm, Left arm, Chest, Abdomen, Front perineal area, Buttocks, Right upper leg, Left upper leg Body parts bathed by helper: Back, Left lower leg, Right lower leg Assist Level: Touching or steadying assistance(Pt > 75%)  Function- Upper Body Dressing/Undressing What is the patient wearing?: Hospital gown Function - Lower Body Dressing/Undressing What is the patient wearing?: Hospital Gown, Non-skid slipper socks Non-skid slipper socks- Performed by helper: Don/doff right sock, Don/doff left sock Assist for footwear: Partial/moderate assist Assist for lower body dressing: Touching or steadying assistance (Pt > 75%)  Function - Toileting Toileting steps completed by patient: Adjust clothing prior to toileting, Performs perineal hygiene, Adjust clothing after toileting Toileting Assistive Devices: Grab bar or rail Assist level: Touching or steadying assistance (Pt.75%)  Function - Air cabin crew transfer assistive device: Grab bar Assist level to toilet: Touching or steadying assistance (Pt > 75%) Assist level from toilet: Touching or steadying assistance (Pt > 75%)  Function - Chair/bed transfer Chair/bed transfer method: Stand pivot Chair/bed transfer assist level: Moderate assist (Pt 50 - 74%/lift or lower) Chair/bed transfer assistive device: Armrests Chair/bed transfer details: Verbal cues for precautions/safety, Verbal cues for technique  Function - Locomotion: Wheelchair Type: Manual Max wheelchair distance: 70' Assist Level: Moderate assistance (Pt 50 - 74%) Assist Level: Moderate assistance (Pt 50 - 74%) Wheel 150 feet activity did not occur: Safety/medical concerns Turns around,maneuvers to table,bed, and toilet,negotiates  3% grade,maneuvers on rugs and over doorsills: No Function - Locomotion: Ambulation Assistive device: Walker-rolling Max distance: 80 ft Assist level: Moderate assist (Pt 50 - 74%) Assist level: Moderate assist (Pt 50 - 74%) Walk 50 feet with 2 turns activity did not occur: Safety/medical concerns Assist level: Moderate assist (Pt 50 - 74%) Walk 150 feet activity did not occur: Safety/medical concerns Walk 10 feet on uneven surfaces activity did not occur: Safety/medical concerns  Function - Comprehension Comprehension: Auditory Comprehension assist level: Understands basic 90% of the time/cues < 10% of the time  Function - Expression Expression: Verbal Expression assist level: Expresses basic needs/ideas: With no assist  Function - Social Interaction Social Interaction assist level: Interacts appropriately with others - No medications needed.  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 DVT Prophylaxis/Anticoagulation: Subcutaneous Lovenox. Monitor for bleeding episodes 3. Pain Management/headachesHydrocodone as needed-left shoulder stiffness at night and K pad as well as Sportscreme, no pain with this last night. 4. Mood:Provide emotional support 5. Neuropsych: This patientiscapable of making decisions on hisown behalf. 6. Skin/Wound Care:Routineskin checks 7. Fluids/Electrolytes/Nutrition:Routine I&O's with follow-up chemistries 8.Hypertension. Permissive hypertension.Goal will start to tighten  control, reviewed primary care notes had been on hydrochlorothiazide increased to 12.5 mg, continue lisinopril 2.5 mg twice daily 9.Tobacco abuse. NicoDerm patch. Provide counseling 10.Hyperlipidemia. Lipitor  11.  Insomnia start trazodone 50 mg nightly as needed  LOS (Days) 3 A FACE TO FACE EVALUATION WAS PERFORMED  Charlett Blake 10/05/2017, 10:02 AM

## 2017-10-05 NOTE — Progress Notes (Signed)
Physical Therapy Session Note  Patient Details  Name: Willie Herrera MRN: [id number] Date of Birth: 10-03-1960  Today's Date: 10/05/2017 PT Individual Time: 1345-1415 PT Individual Time Calculation (min): 30 min   Short Term Goals: Week 1:  PT Short Term Goal 1 (Week 1): Pt will be able to demonstrate functional transfers with min assist PT Short Term Goal 2 (Week 1): Pt will be able to gait x 50' with LRAD with min assist PT Short Term Goal 3 (Week 1): Pt will be able to demonstrate functional dynamic standing balance with min assist  Skilled Therapeutic Interventions/Progress Updates:    no c/o pain.  Session focus on postural control and LLE NMR and gait training.    Pt transfers to EOB with supervision, dons shoes total assist for time management.  Stand/pivot to w/c on L, 2 attempts, with min assist and verbal cues for safe sequencing as pt has tendency to reach across body to grab armrest with RUE.  Stand/pivot to kinetron on R with min guard.  Pt completes 30 cycles on 25 cm/s seated for reciprocal stepping pattern retraining and strengthening.  Sit<>stand x2 focus on equal weight bearing through LEs with min assist for balance. Standing balance on kinetron focus on maintaining posture and R weight shift for equal R/L weight bearing in LEs.  Gait training back to room with RW and min assist with verbal cues for increased L step length/foot clearance, and R weight shift.  Pt positioned in bed with alarm activated, call bell in reach and needs met.   Therapy Documentation Precautions:  Precautions Precautions: Fall Precaution Comments: mild L inattention; L hemi; maintain systolic BP < 569 Restrictions Weight Bearing Restrictions: No   See Function Navigator for Current Functional Status.   Therapy/Group: Individual Therapy  Michel Santee 10/05/2017, 2:16 PM

## 2017-10-05 NOTE — Progress Notes (Signed)
Occupational Therapy Session Note  Patient Details  Name: Willie Herrera MRN: [id number] Date of Birth: 09/21/1960  Today's Date: 10/05/2017 OT Individual Time: [id number] and [id number] OT Individual Time Calculation (min): 64 min and 45 min  Short Term Goals: Week 1:  OT Short Term Goal 1 (Week 1): Pt will transfer in and out of bath tub with LRAD with min  A OT Short Term Goal 2 (Week 1): Pt will perform LB dressing with min A - sit to stand OT Short Term Goal 3 (Week 1): Pt will use right hand at nondominant level in ADL tasks with min  A OT Short Term Goal 4 (Week 1): Pt will don shirt with setup with cues  Skilled Therapeutic Interventions/Progress Updates:    Pt greeted supine in bed. No c/o dizziness. BP 145/87. Agreeable to shower. He ambulated into bathroom with Mod A and RW with L UE orthotic component. Pt requiring cues for safe transfer to TTB while managing device. IV site covered. Pt then completed bathing at sit<stand level with mod vcs for increasing L UE use/attention. Pt able to wash Rt arm using Lt, and also reach head to lather shampoo with cuing. Afterwards pt ambulated to EOB to proceed with dressing. With extra time and cues for hemi techniques, pt completed dressing with overall Min A for donning sneakers. He was able to tie laces using bilateral UEs. Also able to don gripper socks with each LE propped up on bed interchangeably, incorporating L UE. He then requested to return to bed due to fatigue. Still no c/o dizziness. BP 146/81. He repositioned himself in bed with cues to pull up on headboard with both UEs. Pt left with all needs and bed alarm set.    RN made aware of BP readings today.  Interpretor present during session.   2nd Session 1:1 tx (45 min) Pt greeted supine in bed, asleep, easily woken and agreeable to tx. No c/o dizziness. Worked on Lt FMC EOB while using paper money and coins. Had pt hold dollar bills in Rt hand, and count off/pinch with Lt to produce multiple  sums requested by therapist. Also worked on 3 jaw chuck and pincer grasps while picking up coins of various sizes. Pt currently able to pick up half dollar and quarter sized coins with these grasps, but unable to do so with smaller coins. Education emphasis placed on breaking down movements into methodical steps with heavy use of modeling for NMR. At end of session pt requested to remain EOB. He was left with interpretor, call bell, and bed alarm set, anticipating next therapist. Pt reported he would notify staff for OOB/transfer needs.   Therapy Documentation Precautions:  Precautions Precautions: Fall Precaution Comments: mild L inattention; L hemi; maintain systolic BP < 220 Restrictions Weight Bearing Restrictions: No Pain: No c/o pain during session    ADL: ADL ADL Comments: see functional navigator :    See Function Navigator for Current Functional Status.  Therapy/Group: Individual Therapy  Yianni Skilling A Hernando Reali 10/05/2017, 12:52 PM

## 2017-10-06 ENCOUNTER — Inpatient Hospital Stay (HOSPITAL_COMMUNITY): Payer: Self-pay | Admitting: Physical Therapy

## 2017-10-06 ENCOUNTER — Inpatient Hospital Stay (HOSPITAL_COMMUNITY): Payer: Self-pay | Admitting: Occupational Therapy

## 2017-10-06 NOTE — Progress Notes (Signed)
Occupational Therapy Session Note  Patient Details  Name: Willie Herrera MRN: [id number] Date of Birth: 03/20/1961  Today's Date: 10/06/2017 OT Individual Time: 1100-1200 OT Individual Time Calculation (min): 60 min    Short Term Goals: Week 1:  OT Short Term Goal 1 (Week 1): Pt will transfer in and out of bath tub with LRAD with min  A OT Short Term Goal 2 (Week 1): Pt will perform LB dressing with min A - sit to stand OT Short Term Goal 3 (Week 1): Pt will use right hand at nondominant level in ADL tasks with min  A OT Short Term Goal 4 (Week 1): Pt will don shirt with setup with cues  Skilled Therapeutic Interventions/Progress Updates:    Pt seen for OT session focusing on functional mobility and ambulation, fine motor coordination and visual scanning. Pt in supine upon arrival with interpreter present, agreeable to tx session. He declined bathing/dressing this session.  He ambulated to therapy gym using RW, overall min A, however, occasional mod A with frequent cuing to remain in middle of RW and L foot clearance. In therapy gym, completed fine motor activity picking up and placing small foam blocks addressing pincer and gross grasping abilities.  Pt then completed dynamic standing activity, replacing and removing clothes pins placed around waist band in simulation of LB dressing. Pt able to manipulate highest level (black) clothespins with L hand and maintain dynamic standing balance with close supervision.  In BI gym, completed Dynavision activity focusing on L attention, scanning and dynamic standing. Pt able to locate all targets on L independently in reasonable amount of time. Guarding assist for standing balance and min- occasional mod A when standing on foam, non-complaint surface.  trialed functional ambulation using [facility] and then [facility] as [facility] with difficulty managing RW. Overall min with occasional mod A when using other forms of AD. Pt fatiging more quickly without AD and requiring increased  cuing for clearance of L foot.  Pt returned to room at end of session, left seated EOB with bed alarm on, set-up with lunch tray and all needs in reach.   Therapy Documentation Precautions:  Precautions Precautions: Fall Precaution Comments: mild L inattention; L hemi; maintain systolic BP < 220 Restrictions Weight Bearing Restrictions: No Pain:   No/denies pain  ADL: ADL ADL Comments: see functional navigator  See Function Navigator for Current Functional Status.   Therapy/Group: Individual Therapy  Kawika Bischoff L 10/06/2017, 7:16 AM

## 2017-10-06 NOTE — Progress Notes (Signed)
Subjective/Complaints: Pt slept well with trazodone Review of systems denies chest pain shortness of breath, nausea, vomiting, diarrhea, positive, constipation  Objective: Vital Signs: Blood pressure (!) 148/95, pulse 89, temperature 99 F (37.2 C), temperature source Oral, resp. rate 16, height 5\' 4"  (1.626 m), weight 84.7 kg (186 lb 11.7 oz), SpO2 98 %. No results found. No results found for this or any previous visit (from the past 72 hour(s)).   HEENT: normal Cardio: RRR and no murmur Resp: CTA B/L and unlabored GI: BS positive and NT, ND Extremity:  Pulses positive and No Edema Skin:   Intact Neuro: Alert/Oriented, Normal Sensory, Abnormal Motor 3/5 L deltoid, bicep, tricep, grip, 4/5 in left hip flexor knee extensor ankle dorsiflexor 5/5 in right upper and right lower limb and Abnormal FMC Ataxic/ dec FMC Musc/Skel:  Normal and Swelling Left dorsum of hand General no acute distress   Assessment/Plan: 1. 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 Physiatrist is providing close team supervision and 24 hour management of active medical problems listed below. Physiatrist and rehab team continue to assess barriers to discharge/monitor patient progress toward functional and medical goals. FIM: Function - Bathing Position: Shower Body parts bathed by patient: Right arm, Left arm, Chest, Abdomen, Front perineal area, Buttocks, Right upper leg, Left upper leg, Right lower leg, Left lower leg Body parts bathed by helper: Back Assist Level: Touching or steadying assistance(Pt > 75%)  Function- Upper Body Dressing/Undressing What is the patient wearing?: Pull over shirt/dress Pull over shirt/dress - Perfomed by patient: Thread/unthread right sleeve, Thread/unthread left sleeve, Put head through opening, Pull shirt  over trunk Assist Level: Supervision or verbal cues Function - Lower Body Dressing/Undressing What is the patient wearing?: Pants, Non-skid slipper socks, Shoes Position: Sitting EOB Pants- Performed by patient: Thread/unthread right pants leg, Thread/unthread left pants leg, Pull pants up/down Non-skid slipper socks- Performed by patient: Don/doff right sock, Don/doff left sock Non-skid slipper socks- Performed by helper: Don/doff right sock, Don/doff left sock Shoes - Performed by patient: Fasten right, Fasten left Shoes - Performed by helper: Don/doff right shoe, Don/doff left shoe Assist for footwear: Partial/moderate assist Assist for lower body dressing: Touching or steadying assistance (Pt > 75%)  Function - Toileting Toileting steps completed by patient: Adjust clothing prior to toileting, Performs perineal hygiene, Adjust clothing after toileting Toileting Assistive Devices: Grab bar or rail Assist level: Touching or steadying assistance (Pt.75%)  Function - ArchivistToilet Transfers Toilet transfer assistive device: Walker Assist level to toilet: Touching or steadying assistance (Pt > 75%) Assist level from toilet: Touching or steadying assistance (Pt > 75%)  Function - Chair/bed transfer Chair/bed transfer method: Stand pivot Chair/bed transfer assist level: Touching or steadying assistance (Pt > 75%) Chair/bed transfer assistive device: Armrests, Bedrails Chair/bed transfer details: Verbal cues for precautions/safety, Verbal cues for technique  Function - Locomotion: Wheelchair Type: Manual Max wheelchair distance: 50' Assist Level: Moderate assistance (Pt 50 - 74%) Assist Level: Moderate assistance (Pt 50 - 74%) Wheel 150 feet activity did not occur: Safety/medical concerns Turns around,maneuvers to table,bed, and toilet,negotiates 3% grade,maneuvers on rugs and over doorsills: No Function - Locomotion: Ambulation Assistive device: Walker-rolling, Orthosis Max distance:  80 Assist level: Moderate assist (Pt 50 - 74%) Assist level: Moderate assist (Pt 50 - 74%) Walk 50 feet with 2 turns activity did not occur: Safety/medical concerns Assist level: Moderate assist (Pt  50 - 74%) Walk 150 feet activity did not occur: Safety/medical concerns Walk 10 feet on uneven surfaces activity did not occur: Safety/medical concerns  Function - Comprehension Comprehension: Auditory Comprehension assist level: Understands basic 90% of the time/cues < 10% of the time  Function - Expression Expression: Verbal Expression assist level: Expresses basic needs/ideas: With no assist  Function - Social Interaction Social Interaction assist level: Interacts appropriately with others with medication or extra time (anti-anxiety, antidepressant).  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 DVT Prophylaxis/Anticoagulation: Subcutaneous Lovenox. Monitor for bleeding episodes 3. Pain Management/headachesHydrocodone as needed-left shoulder stiffness at night improved with  K pad as well as Sportscreme, no hydrocodone since 3/2 4. Mood:Provide emotional support 5. Neuropsych: This patientiscapable of making decisions on hisown behalf. 6. Skin/Wound Care:Routineskin checks 7. Fluids/Electrolytes/Nutrition:Routine I&O's with follow-up chemistries 8.Hypertension. Permissive hypertension.Goal will start to tighten control, reviewed primary care notes had been on hydrochlorothiazide increased to 12.5 mg, continue lisinopril 2.5 mg twice  daily  Vitals:   10/05/17 2041 10/06/17 0642  BP: (!) 151/95 (!) 148/95  Pulse: 76 89  Resp:  16  Temp:  99 F (37.2 C)  SpO2:  98%  control is improving on low dose antihypertensives    9.Tobacco abuse. NicoDerm patch. Provide counseling 10.Hyperlipidemia. Lipitor  11.  Insomnia improved on trazodone 50 mg nightly as needed  LOS (Days) 4 A FACE TO FACE EVALUATION WAS PERFORMED  Erick Colace 10/06/2017, 8:30 AM

## 2017-10-06 NOTE — Progress Notes (Signed)
Physical Therapy Session Note  Patient Details  Name: Willie Herrera MRN: [id number] Date of Birth: 1961/05/20  Today's Date: 10/06/2017 PT Individual Time: 0930-1030 and [id number]  PT Individual Time Calculation (min): 60 min and 70 min  Short Term Goals: Week 1:  PT Short Term Goal 1 (Week 1): Pt will be able to demonstrate functional transfers with min assist PT Short Term Goal 2 (Week 1): Pt will be able to gait x 50' with LRAD with min assist PT Short Term Goal 3 (Week 1): Pt will be able to demonstrate functional dynamic standing balance with min assist  Skilled Therapeutic Interventions/Progress Updates: Tx1:  Pt presented in bed with interpreter present agreeable to therapy. Pt performed bed mobility with bed flat with min guard and cues for safety with bedding as LLE became tangled in sheets. PTA donned shoes an L AFO total assist for time management as nsg checked vitals and provided meds, BP checked 159/103. Pt ambulated to rehab gym with minA to min guard with narrow BOS and moderate cues for placement of RW as pt would drift to L frequently. In rehab gym pt participated in alternating toe taps no AD with tactile cues for increased L quad activation, alternating toe taps, and toe taps to target. Pt noted to require frequent tactile cues for L quad activation. Pt performed sit to/from stand with RLE on 4in step for increased use of LLE. Pt performed dynamic balance ball taps with forced use of LUE and crossing midline. Pt able to reach outside BOS with no LOB. BP checked several times throughout session 147/83 and 140/94 respectively. Pt ambulated back to room with RW in same manner as prior and returned to bed, final BP check 145/91. Pt left with bed alarm on and needs met.   Tx2: Pt presented in bed with friends present agreeable to therapy. Pt performed bed mobility with bed flat with supervision. PTA donned shoes AFO and ambulated from room to rehab gym with minA with use of SBQC. Pt  required mod cues for B foot clearance and obstacle negotiation on L side. Pt required minA during turn in hallway due to decreased recovery with increased lean/LOB to L.  Pt participated in weaving in cones with [facility] however pt with noted increased difficulty managing LLE with pt at time crossing LLE behind RLE. Pt attempted same activity with RW with improved technique and improved safety with RW. Participated in standing activities including reaching and placing card on board standing with no AD and forced use of LUE and use of peg board on Airex no AD. Pt required min cues for maintaining midline when standing on airex as pt would tend to lean to L. Pt performed gait activities with RW using tape on floor for feedback for increasing BOS and mirror feedback to maintain midline with RW. Pt then ambulated to day room with fair carryover. Pt able to stay within RW with min verbal cues due to slight drift. Performed Cybex Kinetron 40cm/sec with mirrow feedback 10 cycles x 2 and 20cm/sec 10 cycles and 20 cycles ea for LE strengthening and reciprocal activity. Pt returned to room with RW min guard with improved DME safety during turns and returned to bed supervision. Pt left with bed alarm on, needs met, and friends in room.      Therapy Documentation Precautions:  Precautions Precautions: Fall Precaution Comments: mild L inattention; L hemi; maintain systolic BP < 979 Restrictions Weight Bearing Restrictions: No General:   Vital Signs: Therapy Vitals  Temp: 98.2 F (36.8 C) Temp Source: Oral Pulse Rate: 84 Resp: 16 BP: 136/74 Patient Position (if appropriate): Lying Oxygen Therapy SpO2: 95 % O2 Device: Room Air  See Function Navigator for Current Functional Status.   Therapy/Group: Individual Therapy  Mela Perham  Ravenna Legore, PTA  10/06/2017, 4:14 PM

## 2017-10-07 ENCOUNTER — Inpatient Hospital Stay (HOSPITAL_COMMUNITY): Payer: Self-pay | Admitting: Occupational Therapy

## 2017-10-07 ENCOUNTER — Inpatient Hospital Stay (HOSPITAL_COMMUNITY): Payer: Self-pay | Admitting: Physical Therapy

## 2017-10-07 ENCOUNTER — Inpatient Hospital Stay (HOSPITAL_COMMUNITY): Payer: Self-pay

## 2017-10-07 MED ORDER — GUAIFENESIN ER 600 MG PO TB12
600.0000 mg | ORAL_TABLET | Freq: Two times a day (BID) | ORAL | Status: DC
  Administered 2017-10-07 – 2017-10-15 (×16): 600 mg via ORAL
  Filled 2017-10-07 (×16): qty 1

## 2017-10-07 NOTE — Progress Notes (Signed)
Physical Therapy Session Note  Patient Details  Name: Willie Herrera MRN: [id number] Date of Birth: 08/30/1960  Today's Date: 10/07/2017 PT Individual Time: [id number] PT Individual Time Calculation (min): 73 min   Short Term Goals: Week 1:  PT Short Term Goal 1 (Week 1): Pt will be able to demonstrate functional transfers with min assist PT Short Term Goal 2 (Week 1): Pt will be able to gait x 50' with LRAD with min assist PT Short Term Goal 3 (Week 1): Pt will be able to demonstrate functional dynamic standing balance with min assist  Skilled Therapeutic Interventions/Progress Updates:    Pt supine in bed upon PT arrival, agreeable to therapy tx and denies pain. Pt transferred from supine>sitting EOB with supervision and therapist donned shoes total assist. Therapist educated pt on importance of proper fitting shoes as his are too big. Pt ambulated from room>gym x 200 ft with RW and L hand orthosis with min assist and verbal cues for heel strike. Pt in tall kneeling for neuro re-ed, performed 2 x 10 hip extension in tall kneeling without UE support and worked on maintaining balance in tall kneeling with manual perturbations. Pt in quadruped for neuro re-ed performed 2 x 10 alternating hip extension bilateral LEs and 2 x 10 alternating shoulder flexion bilateral UEs. Pt ambulated x 80 ft with RW and L hand orthosis, verbal cues for L heel strike and awareness. Pt performed 2 x 10 sit<>stands with 4 inch step under R LE for increased weightbearing on L side. Pt worked on loading L LE and dynamic standing balance while standing with 4 inch step under R foot and throwing horse shoes x 2 trials. Pt performed sidetepping, forwards ambulation and backwards ambulation 2 x 15 ft with orange theraband for resistance. Pt ambulated back to the room with min assist and RW, performed toileting with supervision. Pt left supine in bed with needs in reach.   Therapy Documentation Precautions:  Precautions Precautions:  Fall Precaution Comments: mild L inattention; L hemi; maintain systolic BP < 220 Restrictions Weight Bearing Restrictions: No   See Function Navigator for Current Functional Status.   Therapy/Group: Individual Therapy  Cresenciano GenreEmily van Schagen, PT, DPT 10/07/2017, 7:57 AM

## 2017-10-07 NOTE — Progress Notes (Signed)
Physical Therapy Session Note  Patient Details  Name: Willie Herrera MRN: [id number] Date of Birth: 1960/09/12  Today's Date: 10/07/2017 PT Individual Time: 0935-1030 PT Individual Time Calculation (min): 55 min   Short Term Goals: Week 1:  PT Short Term Goal 1 (Week 1): Pt will be able to demonstrate functional transfers with min assist PT Short Term Goal 2 (Week 1): Pt will be able to gait x 50' with LRAD with min assist PT Short Term Goal 3 (Week 1): Pt will be able to demonstrate functional dynamic standing balance with min assist  Skilled Therapeutic Interventions/Progress Updates: Pt presented in bed with interpreter present agreeable for therapy. Pt denies pain however stated did not sleep much overnight. PTA donned shoes AFO total assist and pt ambulated to rehab gym with RW min guard , continues to require cues for staying inside RW. Pt performed static standing with RLE on 4in step and mirror for visual feedback for achieving/maintaining midline. Pt required multimodal cues for glutes/quads. As became fatigued increased difficulty coordinating movements. Participated in clothespin reach for forced use of LUE and placed on basketball net with min tactile cues for L quad activation. Pt transferred to mat and participated RLE therex via forced use, including LAQ, SLR, bridging, heel slides 2x10 bilaterally. Pt ambulated back to room with noted improved heel strike and improved positioning in RW. Pt transferred back to bed and pt left with bed alarm on and current needs met.      Therapy Documentation Precautions:  Precautions Precautions: Fall Precaution Comments: mild L inattention; L hemi; maintain systolic BP < 219 Restrictions Weight Bearing Restrictions: No General:   Vital Signs: Therapy Vitals Temp: 98.7 F (37.1 C) Temp Source: Oral Pulse Rate: 71 Resp: 18 BP: 139/83 Patient Position (if appropriate): Lying Oxygen Therapy SpO2: 96 % O2 Device: Room Air   See Function  Navigator for Current Functional Status.   Therapy/Group: Individual Therapy  Nyles Mitton  Jetty Berland, PTA  10/07/2017, 4:06 PM

## 2017-10-07 NOTE — Progress Notes (Signed)
Occupational Therapy Session Note  Patient Details  Name: Willie Herrera MRN: [id number] Date of Birth: 08/10/1960  Today's Date: 10/07/2017 OT Individual Time: 1100-1200 OT Individual Time Calculation (min): 60 min    Short Term Goals: Week 1:  OT Short Term Goal 1 (Week 1): Pt will transfer in and out of bath tub with LRAD with min  A OT Short Term Goal 2 (Week 1): Pt will perform LB dressing with min A - sit to stand OT Short Term Goal 3 (Week 1): Pt will use right hand at nondominant level in ADL tasks with min  A OT Short Term Goal 4 (Week 1): Pt will don shirt with setup with cues  Skilled Therapeutic Interventions/Progress Updates:    Pt seen for OT ADL bathing/dressing session. Pt in supine upon arrival with interpreter present, agreeable to tx session. Pt ambulated throughout session with RW, VCs throughout for hand placement and functional management of RW. He completed grooming tasks standing at sink, able to use B UEs in functional manner mod I. Pt with L drift in static standing, requiring intermittent cuing for awareness of midline orientation. He bathed seated on tub bench, standing without grab bars while completing pericare/buttock hygiene, and guarding assist provided by therapist. He dressed seated on toilet, slightly increased time to manipulate small buttons on button up shirt and VCs for safety awareness throughout as pt attempting to stand to thread on clothing. He returned to EOB to don socks, VCs for problem solving/ adaptive method as pt becoming very frustrated with failed attempts due to decreased ROM and body habitus limiting his ability to access feet. Pt transitioned to w/c and propelled w/c using B UEs towards ADL apartment. Pt able to maintain straight trajectory, however, fatigued quickly. In ADL apartment, pt completed simulated shower stall transfer with guarding assist following demonstration and VCs for technique. Pt in agreement with ordering BSC for use of shower  chair.  He ambulated back to room at end of session, with close supervision and occasional cuing to maintain midline position in RW.  Pt returned to sitting EOB with min A and VCs for safety. Pt left seated EOB with bed alarm set, set-up with meal tray and family present.   Therapy Documentation Precautions:  Precautions Precautions: Fall Precaution Comments: mild L inattention; L hemi; maintain systolic BP < 220 Restrictions Weight Bearing Restrictions: No Pain:   No/denies pain ADL: ADL ADL Comments: see functional navigator  See Function Navigator for Current Functional Status.   Therapy/Group: Individual Therapy  Tamaira Ciriello L 10/07/2017, 7:07 AM

## 2017-10-07 NOTE — Plan of Care (Signed)
  Progressing Consults RH STROKE PATIENT EDUCATION Description See Patient Education module for education specifics  10/07/2017 1319 - Progressing by Dani Gobbleeardon, Trelon Plush J, RN RH COGNITION-NURSING RH STG ANTICIPATES NEEDS/CALLS FOR ASSIST W/ASSIST/CUES Description STG Anticipates Needs/Calls for Assist With Mod I Assistance/Cues.  10/07/2017 1319 - Progressing by Dani Gobbleeardon, Dennie Moltz J, RN RH KNOWLEDGE DEFICIT RH STG INCREASE KNOWLEDGE OF HYPERTENSION Description Pt will be able to verbalize 2 ways to manage blood pressure at the time of discharge with min cues.   10/07/2017 1319 - Progressing by Dani Gobbleeardon, Jeanclaude Wentworth J, RN   Not Progressing RH SAFETY RH STG ADHERE TO SAFETY PRECAUTIONS W/ASSISTANCE/DEVICE Description STG Adhere to Safety Precautions With Mod I Assistance/Device.  10/07/2017 1319 - Not Progressing by Dani Gobbleeardon, Aiyanah Kalama J, RN

## 2017-10-07 NOTE — Progress Notes (Signed)
Subjective/Complaints: Sleeping well, no left shoulder pain, calls appropriately for toileting Review of systems denies chest pain shortness of breath, nausea, vomiting, diarrhea, positive, constipation  Objective: Vital Signs: Blood pressure 136/76, pulse 82, temperature 98.4 F (36.9 C), temperature source Oral, resp. rate 18, height 5\' 4"  (1.626 m), weight 84.7 kg (186 lb 11.7 oz), SpO2 97 %. No results found. No results found for this or any previous visit (from the past 72 hour(s)).   HEENT: normal Cardio: RRR and no murmur Resp: CTA B/L and unlabored GI: BS positive and NT, ND Extremity:  Pulses positive and No Edema Skin:   Intact Neuro: Alert/Oriented, Normal Sensory, Abnormal Motor 3/5 L deltoid, bicep, tricep, grip, 4/5 in left hip flexor knee extensor ankle dorsiflexor 5/5 in right upper and right lower limb and Abnormal FMC Ataxic/ dec FMC Musc/Skel:  Normal and Swelling Left dorsum of hand General no acute distress   Assessment/Plan: 1. 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 Physiatrist is providing close team supervision and 24 hour management of active medical problems listed below. Physiatrist and rehab team continue to assess barriers to discharge/monitor patient progress toward functional and medical goals. FIM: Function - Bathing Position: Shower Body parts bathed by patient: Right arm, Left arm, Chest, Abdomen, Front perineal area, Buttocks, Right upper leg, Left upper leg, Right lower leg, Left lower leg Body parts bathed by helper: Back Assist Level: Touching or steadying assistance(Pt > 75%)  Function- Upper Body Dressing/Undressing What is the patient wearing?: Pull over shirt/dress Pull over shirt/dress - Perfomed by patient: Thread/unthread right sleeve, Thread/unthread left  sleeve, Put head through opening, Pull shirt over trunk Assist Level: Supervision or verbal cues Function - Lower Body Dressing/Undressing What is the patient wearing?: Pants, Non-skid slipper socks, Shoes Position: Sitting EOB Pants- Performed by patient: Thread/unthread right pants leg, Thread/unthread left pants leg, Pull pants up/down Non-skid slipper socks- Performed by patient: Don/doff right sock, Don/doff left sock Non-skid slipper socks- Performed by helper: Don/doff right sock, Don/doff left sock Shoes - Performed by patient: Fasten right, Fasten left Shoes - Performed by helper: Don/doff right shoe, Don/doff left shoe Assist for footwear: Partial/moderate assist Assist for lower body dressing: Touching or steadying assistance (Pt > 75%)  Function - Toileting Toileting steps completed by patient: Adjust clothing prior to toileting, Performs perineal hygiene, Adjust clothing after toileting Toileting Assistive Devices: Grab bar or rail Assist level: Touching or steadying assistance (Pt.75%)  Function - ArchivistToilet Transfers Toilet transfer assistive device: Walker Assist level to toilet: Touching or steadying assistance (Pt > 75%) Assist level from toilet: Touching or steadying assistance (Pt > 75%)  Function - Chair/bed transfer Chair/bed transfer method: Stand pivot Chair/bed transfer assist level: Touching or steadying assistance (Pt > 75%) Chair/bed transfer assistive device: Armrests, Bedrails Chair/bed transfer details: Verbal cues for precautions/safety, Verbal cues for technique  Function - Locomotion: Wheelchair Type: Manual Max wheelchair distance: 50' Assist Level: Moderate assistance (Pt 50 - 74%) Assist Level: Moderate assistance (Pt 50 - 74%) Wheel 150 feet activity did not occur: Safety/medical concerns Turns around,maneuvers to table,bed, and toilet,negotiates 3% grade,maneuvers on rugs and over doorsills: No Function - Locomotion: Ambulation Assistive device:  Walker-rolling, Orthosis Max distance: 80 Assist level: Moderate assist (Pt 50 - 74%) Assist level: Moderate assist (Pt 50 - 74%) Walk 50 feet with 2 turns activity did not occur: Safety/medical concerns Assist  level: Moderate assist (Pt 50 - 74%) Walk 150 feet activity did not occur: Safety/medical concerns Walk 10 feet on uneven surfaces activity did not occur: Safety/medical concerns  Function - Comprehension Comprehension: Auditory Comprehension assist level: Understands basic 90% of the time/cues < 10% of the time  Function - Expression Expression: Verbal Expression assist level: Expresses basic needs/ideas: With no assist  Function - Social Interaction Social Interaction assist level: Interacts appropriately with others with medication or extra time (anti-anxiety, antidepressant).  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 DVT Prophylaxis/Anticoagulation: Subcutaneous Lovenox. Monitor for bleeding episodes 3. Pain Management/headachesHydrocodone as needed-left shoulder stiffness at night improved with  K pad as well as Sportscreme, no hydrocodone since 3/2 4. Mood:Provide emotional support 5. Neuropsych: This patientiscapable of making decisions on hisown behalf. 6. Skin/Wound Care:Routineskin checks 7. Fluids/Electrolytes/Nutrition:Routine I&O's with follow-up chemistries 8.Hypertension. Permissive hypertension.Goal will start to tighten control, reviewed primary care notes had been on hydrochlorothiazide increased to 12.5 mg, continue lisinopril 2.5 mg twice  daily  Vitals:   10/06/17 2153 10/07/17 0600  BP: (!) 145/96 136/76  Pulse:  82  Resp:  18  Temp:  98.4 F (36.9 C)  SpO2:  97%  control is improving on low dose antihypertensives    9.Tobacco abuse. NicoDerm patch. Provide counseling 10.Hyperlipidemia. Lipitor  11.  Insomnia improved on trazodone 50 mg nightly as needed  LOS (Days) 5 A FACE TO FACE EVALUATION WAS PERFORMED  Erick Colace 10/07/2017, 7:18 AM

## 2017-10-08 ENCOUNTER — Inpatient Hospital Stay (HOSPITAL_COMMUNITY): Payer: Self-pay | Admitting: Physical Therapy

## 2017-10-08 ENCOUNTER — Inpatient Hospital Stay (HOSPITAL_COMMUNITY): Payer: Self-pay | Admitting: Occupational Therapy

## 2017-10-08 MED ORDER — LISINOPRIL 5 MG PO TABS
5.0000 mg | ORAL_TABLET | Freq: Two times a day (BID) | ORAL | Status: DC
  Administered 2017-10-08 – 2017-10-13 (×10): 5 mg via ORAL
  Filled 2017-10-08 (×10): qty 1

## 2017-10-08 NOTE — Plan of Care (Signed)
  Progressing Consults RH STROKE PATIENT EDUCATION Description See Patient Education module for education specifics  10/08/2017 0610 - Progressing by Marigene EhlersGonzalez, Clois Treanor, RN RH SAFETY RH STG ADHERE TO SAFETY PRECAUTIONS W/ASSISTANCE/DEVICE Description STG Adhere to Safety Precautions With Mod I Assistance/Device.  10/08/2017 0610 - Progressing by Marigene EhlersGonzalez, Emmanuell Kantz, RN RH COGNITION-NURSING RH STG ANTICIPATES NEEDS/CALLS FOR ASSIST W/ASSIST/CUES Description STG Anticipates Needs/Calls for Assist With Mod I Assistance/Cues.  10/08/2017 0610 - Progressing by Marigene EhlersGonzalez, Kayler Buckholtz, RN

## 2017-10-08 NOTE — Progress Notes (Signed)
Physical Therapy Session Note  Patient Details  Name: Willie Herrera MRN: [id number] Date of Birth: Mar 23, 1961  Today's Date: 10/08/2017 PT Individual Time: 0930-1030 PT Individual Time Calculation (min): 60 min   Short Term Goals: Week 1:  PT Short Term Goal 1 (Week 1): Pt will be able to demonstrate functional transfers with min assist PT Short Term Goal 2 (Week 1): Pt will be able to gait x 50' with LRAD with min assist PT Short Term Goal 3 (Week 1): Pt will be able to demonstrate functional dynamic standing balance with min assist  Skilled Therapeutic Interventions/Progress Updates:   Pt received sitting in WC and agreeable to PT. Pt transported to rehab gym in [place] pivot to bed with min assist from PT.   Sit>supine with supervision assist.   PT instructed pt in Supine NMR.  SAQ x 12 BLE Ankle PF with level 2 tband x 12 Ankle DF x 15 BLE Isometric hip adduction x 12 BLE Bridges with BLE x 8 with 2 second hold.  Bridge in figure 4 x 8 with min assist to stabilize the LLE.  Hip abduction x 15 BLE   QPed NMR. Donkey kicks x 6 BLE Shoulder flexion x 8 BUE.    Cues throughout NMR for decreased speed as well as proper ROM to improve coordination NMR control.    PT instructed pt in gait training with RW and LUE hand splint x 231f with min-supervision assist from PT. Additional gait training x 159fwith RW and supervision assist. Min cues for increased hip flexion and heel contact to minimize foot drag. Gait training without AD x 6063fith min assist and min cues for safety in turns.   Patient returned to room and left sitting in WC [facility] call bell in reach and all needs met.             Therapy Documentation Precautions:  Precautions Precautions: Fall Precaution Comments: mild L inattention; L hemi; maintain systolic BP < 220505strictions Weight Bearing Restrictions: No Pain: Pain Assessment Pain Assessment: 0-10 Pain Score: 0-No pain   See Function Navigator  for Current Functional Status.   Therapy/Group: Individual Therapy  AusLorie Phenix6/2019, 10:45 AM

## 2017-10-08 NOTE — Progress Notes (Signed)
Occupational Therapy Session Note  Patient Details  Name: Willie Herrera MRN: [id number] Date of Birth: 03/29/1961  Today's Date: 10/08/2017 OT Individual Time: 1100-1200 OT Individual Time Calculation (min): 60 min    Short Term Goals: Week 1:  OT Short Term Goal 1 (Week 1): Pt will transfer in and out of bath tub with LRAD with min  A OT Short Term Goal 2 (Week 1): Pt will perform LB dressing with min A - sit to stand OT Short Term Goal 3 (Week 1): Pt will use right hand at nondominant level in ADL tasks with min  A OT Short Term Goal 4 (Week 1): Pt will don shirt with setup with cues  Skilled Therapeutic Interventions/Progress Updates:    Pt seen for OT session focusing on fine motor coordination and planning of meal in prep for cooking task tomorrow. Pt in supine upon arrival with interpreter present, pt agreeable to tx session. He declined bathing/dressing session this morning. He ambulated throughout room with RW and close supervision, requiring intermittent steadying assist and VCs throughout session during functional ambulation due to poor safety awareness, poor RW management, and for awareness of midline orientation due to L drift and inattention. He completed grooming tasks standing at sink, able to incorporate B UEs into functional task mod I.  He ambulated to therapy day room and completed 9 hole peg test, see results below. He then completed activity of threading small bears on string to address in hand manipulation and coordination skills using L UE.  Discussed at length plan for cooking activity tomorrow. Pt created shopping list of needed items and education/ discussion regarding pt's CLOF vs PLOF and how that will change following CVA in regards to cooking task. Pt demonstrates decreased awareness of deficits in regards to functional task. Pt returned to room at end of session, left seated EOB awaiting lunch tray with all needs in reach and bed alarm on.   9 Hole Peg Test:  R: 35.5,  27.2, and 25.0 L: 57.8 59.1 and 60.1  Therapy Documentation Precautions:  Precautions Precautions: Fall Precaution Comments: mild L inattention; L hemi; maintain systolic BP < 220 Restrictions Weight Bearing Restrictions: No Pain:   No/denies pain ADL: ADL ADL Comments: see functional navigator  See Function Navigator for Current Functional Status.   Therapy/Group: Individual Therapy  Willie Herrera L 10/08/2017, 6:58 AM

## 2017-10-08 NOTE — Progress Notes (Signed)
Subjective/Complaints: No issues overnight.  Denies any shoulder pain. Review of systems denies chest pain shortness of breath, nausea, vomiting, diarrhea, positive, constipation  Objective: Vital Signs: Blood pressure (!) 151/98, pulse (!) 104, temperature 99.3 F (37.4 C), temperature source Oral, resp. rate 16, height 5' 4"  (1.626 m), weight 85 kg (187 lb 6.3 oz), SpO2 98 %. No results found. No results found for this or any previous visit (from the past 72 hour(s)).   HEENT: normal Cardio: RRR and no murmur Resp: CTA B/L and unlabored GI: BS positive and NT, ND Extremity:  Pulses positive and No Edema Skin:   Intact Neuro: Alert/Oriented, Normal Sensory, Abnormal Motor 3/5 L deltoid, bicep, tricep, grip, 4/5 in left hip flexor knee extensor ankle dorsiflexor 5/5 in right upper and right lower limb and Abnormal FMC Ataxic/ dec FMC Musc/Skel:  Normal and Swelling Left dorsum of hand General no acute distress   Assessment/Plan: 1. 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 Physiatrist is providing close team supervision and 24 hour management of active medical problems listed below. Physiatrist and rehab team continue to assess barriers to discharge/monitor patient progress toward functional and medical goals. FIM: Function - Bathing Position: Shower Body parts bathed by patient: Right arm, Left arm, Chest, Abdomen, Front perineal area, Buttocks, Right upper leg, Left upper leg, Right lower leg, Left lower leg Body parts bathed by helper: Back Assist Level: Touching or steadying assistance(Pt > 75%)  Function- Upper Body Dressing/Undressing What is the patient wearing?: Pull over shirt/dress Pull over shirt/dress - Perfomed by patient: Thread/unthread right sleeve, Thread/unthread left sleeve, Put head through  opening, Pull shirt over trunk Assist Level: Supervision or verbal cues Function - Lower Body Dressing/Undressing What is the patient wearing?: Pants, Non-skid slipper socks Position: Wheelchair/chair at sink Pants- Performed by patient: Thread/unthread right pants leg, Thread/unthread left pants leg, Pull pants up/down Non-skid slipper socks- Performed by patient: Don/doff right sock, Don/doff left sock Non-skid slipper socks- Performed by helper: Don/doff right sock, Don/doff left sock Shoes - Performed by patient: Fasten right, Fasten left Shoes - Performed by helper: Don/doff right shoe, Don/doff left shoe Assist for footwear: Supervision/touching assist Assist for lower body dressing: Touching or steadying assistance (Pt > 75%)  Function - Toileting Toileting steps completed by patient: Adjust clothing prior to toileting, Performs perineal hygiene, Adjust clothing after toileting Toileting Assistive Devices: Grab bar or rail Assist level: Touching or steadying assistance (Pt.75%)  Function - Air cabin crew transfer assistive device: Walker, Grab bar Assist level to toilet: Touching or steadying assistance (Pt > 75%) Assist level from toilet: Touching or steadying assistance (Pt > 75%)  Function - Chair/bed transfer Chair/bed transfer method: Stand pivot, Ambulatory Chair/bed transfer assist level: Touching or steadying assistance (Pt > 75%) Chair/bed transfer assistive device: Armrests, Bedrails Chair/bed transfer details: Verbal cues for precautions/safety, Verbal cues for technique  Function - Locomotion: Wheelchair Type: Manual Max wheelchair distance: 50' Assist Level: Moderate assistance (Pt 50 - 74%) Assist Level: Moderate assistance (Pt 50 - 74%) Wheel 150 feet activity did not occur: Safety/medical concerns Turns around,maneuvers to table,bed, and toilet,negotiates 3% grade,maneuvers on rugs and over doorsills: No Function - Locomotion: Ambulation Assistive  device: Walker-rolling, Orthosis Max distance: 150 ft Assist level: Touching or steadying assistance (Pt > 75%) Assist level: Touching or steadying assistance (Pt > 75%) Walk 50 feet with 2 turns activity  did not occur: Safety/medical concerns Assist level: Touching or steadying assistance (Pt > 75%) Walk 150 feet activity did not occur: Safety/medical concerns Assist level: Touching or steadying assistance (Pt > 75%) Walk 10 feet on uneven surfaces activity did not occur: Safety/medical concerns  Function - Comprehension Comprehension: Auditory Comprehension assist level: Understands basic 90% of the time/cues < 10% of the time  Function - Expression Expression: Verbal Expression assist level: Expresses basic needs/ideas: With no assist  Function - Social Interaction Social Interaction assist level: Interacts appropriately with others with medication or extra time (anti-anxiety, antidepressant).  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 Formulized individual treatment plan based on medical history, underlying problem and comorbidities. 2. DVT Prophylaxis/Anticoagulation: Subcutaneous Lovenox. Monitor for bleeding episodes 3. Pain Management/headachesHydrocodone as needed-left shoulder stiffness at night improved with  K pad as well as Sportscreme, no hydrocodone since 3/2 4. Mood:Provide emotional support 5. Neuropsych: This  patientiscapable of making decisions on hisown behalf. 6. Skin/Wound Care:Routineskin checks 7. Fluids/Electrolytes/Nutrition:Routine I&O's with follow-up chemistries 8.Hypertension. Permissive hypertension.Goal will start to tighten control, reviewed primary care notes had been on hydrochlorothiazide increased to 12.5 mg, increase lisinopril 5 mg twice daily  Vitals:   10/07/17 2213 10/08/17 0252  BP: (!) 146/90 (!) 151/98  Pulse:  (!) 104  Resp:  16  Temp:  99.3 F (37.4 C)  SpO2:  98%  Blood pressures creeping up systolic diastolic elevated today, adjust antihypertensive medications    9.Tobacco abuse. NicoDerm patch. Provide counseling 10.Hyperlipidemia. Lipitor  11.  Insomnia improved on trazodone 50 mg nightly as needed  LOS (Days) 6 A FACE TO FACE EVALUATION WAS PERFORMED  Charlett Blake 10/08/2017, 8:53 AM

## 2017-10-08 NOTE — Plan of Care (Signed)
  Progressing Consults RH STROKE PATIENT EDUCATION Description See Patient Education module for education specifics  10/08/2017 1320 - Progressing by Dani Gobbleeardon, Keatyn Jawad J, RN RH COGNITION-NURSING RH STG ANTICIPATES NEEDS/CALLS FOR ASSIST W/ASSIST/CUES Description STG Anticipates Needs/Calls for Assist With Mod I Assistance/Cues.  10/08/2017 1320 - Progressing by Dani Gobbleeardon, Tymothy Cass J, RN RH KNOWLEDGE DEFICIT RH STG INCREASE KNOWLEDGE OF HYPERTENSION Description Pt will be able to verbalize 2 ways to manage blood pressure at the time of discharge with min cues.   10/08/2017 1320 - Progressing by Dani Gobbleeardon, Georgio Hattabaugh J, RN Spiritual Needs Ability to function at adequate level 10/08/2017 1320 - Progressing by Dani Gobbleeardon, Laurann Mcmorris J, RN   Not Progressing RH SAFETY RH STG ADHERE TO SAFETY PRECAUTIONS W/ASSISTANCE/DEVICE Description STG Adhere to Safety Precautions With Mod I Assistance/Device.  10/08/2017 1320 - Not Progressing by Dani Gobbleeardon, Linzie Boursiquot J, RN

## 2017-10-08 NOTE — Progress Notes (Signed)
Physical Therapy Session Note  Patient Details  Name: Willie Herrera MRN: [id number] Date of Birth: 09-15-1960  Today's Date: 10/08/2017 PT Individual Time: 6986-1483 PT Individual Time Calculation (min): 70 min   Short Term Goals: Week 1:  PT Short Term Goal 1 (Week 1): Pt will be able to demonstrate functional transfers with min assist PT Short Term Goal 2 (Week 1): Pt will be able to gait x 50' with LRAD with min assist PT Short Term Goal 3 (Week 1): Pt will be able to demonstrate functional dynamic standing balance with min assist  Skilled Therapeutic Interventions/Progress Updates:  Pt presented in bed interpreter present agreeable to therapy. Performed bed mobility bed flat with supervision. Ambulated to [facility] gym with RW noted to have improved. Performed Dynavision Mode B three trials: first trial level tile, only use of LUE and only red lights. Pt performed with reaction time of 1.99 sec and 54/58 accuracy with weakest quadrant noted to be lower R. Second trial level tile mod B incorporating green lights. Pt with improved reaction time 1.91 sec and no significant deficits between quadrants. Third trial on Airex 60sec with red/green light combo with 100% accuracy and 1.72 reaction time. Pt indicating bowl urgency after activity pt ambulated back to room and transferred to toilet. During transfer pt noted to have decreased safety awareness with RW and noted L LOB requiring PTA assist for correction (+BM). After toileting provided pt edu on safety with RW in smaller spaces to decrease risk of fall. Pt verbalized understanding. Pt ambulated to ADL kitchen and checked cabinets high/low for ingredients for cooking activity tomorrow. Pt required mod cues for safety with RW in kitchen environment as pt would leave RW when turning or moving along counter. Pt able to maintain fair balance when bending down to check lower cabinets. Pt requesting to use toilet again, ambulated to bathroom and demonstrated improved  safety with RW. Pt ambulated to rehab gym and participated in obstacle negotiation weaving through cones, stepping over thresholds, and sidestepping through cones requiring min cues for safety however pt able to safely negotiate when decreased distractions noted. Pt ambulated back to room min guard continuing to demonstrate fair consistent heel strike and returned to bed once in room. Pt left with bed alarm on, call bell within reach and needs met.      Therapy Documentation Precautions:  Precautions Precautions: Fall Precaution Comments: mild L inattention; L hemi; maintain systolic BP < 073 Restrictions Weight Bearing Restrictions: No   See Function Navigator for Current Functional Status.   Therapy/Group: Individual Therapy  Deakin Lacek  Herschell Virani, PTA  10/08/2017, 2:58 PM

## 2017-10-08 NOTE — Patient Care Conference (Signed)
Inpatient RehabilitationTeam Conference and Plan of Care Update Date: 10/08/2017   Time: 10:15 AM    Patient Name: Willie Herrera      Medical Record Number: [id number]  Date of Birth: Feb 11, 1961 Sex: Male         Room/Bed: 4W04C/4W04C-01 Payor Info: Payor: MEDICAID POTENTIAL / Plan: MEDICAID POTENTIAL / Product Type: *No Product type* /    Admitting Diagnosis: CVA  Admit Date/Time:  10/02/2017  5:34 PM Admission Comments: No comment available   Primary Diagnosis:  <principal problem not specified> Principal Problem: <principal problem not specified>  Patient Active Problem List   Diagnosis Date Noted  . CVA (cerebral vascular accident) (HCC) 10/02/2017  . Alcohol abuse   . Hyperlipidemia   . Ischemic stroke (HCC) 10/01/2017  . Hypertensive urgency 10/01/2017  . Cough 09/13/2014  . HTN (hypertension) 08/25/2014  . Hyperglycemia 08/25/2014  . Allergic rhinitis 08/25/2014  . Routine general medical examination at a health care facility 05/03/2014  . Insomnia 04/18/2014  . Obesity, unspecified 04/18/2014  . Smoker 05/29/2010  . SHORTNESS OF BREATH 05/15/2010  . ABDOMINAL DISTENSION 05/15/2010    Expected Discharge Date: Expected Discharge Date: 10/15/17  Team Members Present: Physician leading conference: Dr. Claudette Herrera Social Worker Present: Willie Der, LCSW Nurse Present: Willie Bacon, RN PT Present: Willie Herrera, PTA;Willie Herrera, PT OT Present: Willie Herrera, OT SLP Present: Willie Herrera, SLP PPS Coordinator present : Willie Duck, RN, CRRN     Current Status/Progress Goal Weekly Team Focus  Medical   uncontrolled HTN, Left hemiparesis and gait disorder  Reduce fall risk , control BP  Med management   Bowel/Bladder   Continent of B/B  Maintain continence  Assist with toileting as needed   Swallow/Nutrition/ Hydration             ADL's   Close supervision-min A overall, requires mod-max cuing for safety awareness  Supervision overall  ADL re-training, safety  awarenss, IADL re-training, neuro re-ed, d/c planning   Mobility   Min assist from PT for bed mobilty, transfers, gait, and stair management   Supervision-Mod I transfer and gait with LRAD   balance, attention to the LUE and LLE, safety and improved independence with gait and transfers.    Communication             Safety/Cognition/ Behavioral Observations            Pain   C/o muscle stiffness  Pain < 2  Assess qshift and PRN   Skin   Skin intact  Prevent skin breakdown and infection  Assess Qshift and PRN      *See Care Plan and progress notes for long and short-term goals.     Barriers to Discharge  Current Status/Progress Possible Resolutions Date Resolved   Physician    Medical stability;Medication compliance     progressing toward goals  Cont rehab, med management      Nursing                  PT                    OT                  SLP                SW                Discharge Planning/Teaching Needs:  Home with family may be times he is  alone-may be able to have freinds check on while family working during the day      Team Discussion:  Goals supervision-mod/i level. Shoulder pain better with k-pad. Impulsive at times and BP on high side MD is adjusting meds. Wants to cook at home OT to take in kitchen tomorrow to see how safe he is in there. Family to provide assist in the evenings and friends during the day.  Revisions to Treatment Plan:  DC 3/13    Continued Need for Acute Rehabilitation Level of Care: The patient requires daily medical management by a physician with specialized training in physical medicine and rehabilitation for the following conditions: Daily direction of a multidisciplinary physical rehabilitation program to ensure safe treatment while eliciting the highest outcome that is of practical value to the patient.: Yes Daily medical management of patient stability for increased activity during participation in an intensive rehabilitation  regime.: Yes Daily analysis of laboratory values and/or radiology reports with any subsequent need for medication adjustment of medical intervention for : Neurological problems;Blood pressure problems  Ceriah Herrera, Willie Herrera 10/09/2017, 11:46 AM

## 2017-10-09 ENCOUNTER — Inpatient Hospital Stay (HOSPITAL_COMMUNITY): Payer: Self-pay | Admitting: Physical Therapy

## 2017-10-09 ENCOUNTER — Inpatient Hospital Stay (HOSPITAL_COMMUNITY): Payer: Self-pay | Admitting: Occupational Therapy

## 2017-10-09 DIAGNOSIS — F5101 Primary insomnia: Secondary | ICD-10-CM

## 2017-10-09 LAB — CREATININE, SERUM
CREATININE: 1.45 mg/dL — AB (ref 0.61–1.24)
GFR, EST NON AFRICAN AMERICAN: 53 mL/min — AB (ref >60)

## 2017-10-09 MED ORDER — HYDROCHLOROTHIAZIDE 12.5 MG PO CAPS
12.5000 mg | ORAL_CAPSULE | Freq: Once | ORAL | Status: AC
  Administered 2017-10-09: 12.5 mg via ORAL
  Filled 2017-10-09: qty 1

## 2017-10-09 MED ORDER — TRAZODONE HCL 50 MG PO TABS
50.0000 mg | ORAL_TABLET | Freq: Every day | ORAL | Status: DC
  Administered 2017-10-09 – 2017-10-14 (×6): 50 mg via ORAL
  Filled 2017-10-09 (×6): qty 1

## 2017-10-09 MED ORDER — HYDROCHLOROTHIAZIDE 25 MG PO TABS
25.0000 mg | ORAL_TABLET | Freq: Every day | ORAL | Status: DC
  Administered 2017-10-10 – 2017-10-15 (×6): 25 mg via ORAL
  Filled 2017-10-09 (×6): qty 1

## 2017-10-09 NOTE — Progress Notes (Signed)
Occupational Therapy Session Note  Patient Details  Name: Willie Herrera MRN: [id number] Date of Birth: 10/20/1960  Today's Date: 10/09/2017 OT Individual Time: 1100-1200 OT Individual Time Calculation (min): 60 min    Short Term Goals: Week 1:  OT Short Term Goal 1 (Week 1): Pt will transfer in and out of bath tub with LRAD with min  A OT Short Term Goal 2 (Week 1): Pt will perform LB dressing with min A - sit to stand OT Short Term Goal 3 (Week 1): Pt will use right hand at nondominant level in ADL tasks with min  A OT Short Term Goal 4 (Week 1): Pt will don shirt with setup with cues  Skilled Therapeutic Interventions/Progress Updates:  Pt presented supine in bed, had no C/O pain, he agreed to ADL bathing session in shower, Pt dressed UB and LB on BSC in bathroom ADL session required Supervision with Min VC's for saftey. [facility] next activity in rehab gym but requested to walk with RW. He participated in standing ball toss activity while standing EOM requiring one rest break offered by OTAS, then ambulated back to his room without AD while tossing the ball with another OT. Both activities offered to improve static and dynamic standing balance, endurance, core rotation, UB strength, Bilateral intergradation, and hand eye coordination. He required supervision with Min VC's to slow down and stay in RW for safety for entire therapy session. Pt was left in bed, with family present, bed alarm on and call bell at reach.      Therapy Documentation Precautions:  Precautions Precautions: Fall Precaution Comments: mild L inattention; L hemi; maintain systolic BP < 220 Restrictions Weight Bearing Restrictions: No General:      See Function Navigator for Current Functional Status.   Therapy/Group: Individual Therapy  Baruch GoutyMary Aryahna Spagna 10/09/2017, 12:43 PM

## 2017-10-09 NOTE — Progress Notes (Signed)
Subjective/Complaints: Restless last night. Left shoulder shoulder slightly sore but that didn't keep him up  ROS: pt denies nausea, vomiting, diarrhea, cough, shortness of breath or chest pain   Objective: Vital Signs: Blood pressure (!) 143/85, pulse 82, temperature 99 F (37.2 C), temperature source Oral, resp. rate 16, height 5' 4"  (1.626 m), weight 85 kg (187 lb 6.3 oz), SpO2 99 %. No results found. Results for orders placed or performed during the hospital encounter of 10/02/17 (from the past 72 hour(s))  Creatinine, serum     Status: Abnormal   Collection Time: 10/09/17  6:41 AM  Result Value Ref Range   Creatinine, Ser 1.45 (H) 0.61 - 1.24 mg/dL   GFR calc non Af Amer 53 (L) >60 mL/min   GFR calc Af Amer >60 >60 mL/min    Comment: (NOTE) The eGFR has been calculated using the CKD EPI equation. This calculation has not been validated in all clinical situations. eGFR's persistently <60 mL/min signify possible Chronic Kidney Disease. Performed at [facility], [place] [address] [place], [place] [address]      HEENT: normal Cardio: RRR without murmur. No JVD  Resp: CTA Bilaterally without wheezes or rales. Normal effort  GI: BS +, non-tender, non-distended  Extremity:  Pulses positive and No Edema Skin:   Intact Neuro: Alert/Oriented, Normal Sensory, Abnormal Motor 3+ to 4-/5 L deltoid, bicep, tricep, grip, 4+/5 in left hip flexor knee extensor ankle dorsiflexor 5/5 in right upper and right lower limb and improving FMC LLE Musc/Skel:  Normal and Swelling Left dorsum of hand General no acute distress   Assessment/Plan: 1. 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 Physiatrist is providing close team supervision and 24 hour management of active medical problems listed below. Physiatrist and rehab  team continue to assess barriers to discharge/monitor patient progress toward functional and medical goals. FIM: Function - Bathing Position: Shower Body parts bathed by patient: Right arm, Left arm, Chest, Abdomen, Front perineal area, Buttocks, Right upper leg, Left upper leg, Right lower leg, Left lower leg Body parts bathed by helper: Back Assist Level: Touching or steadying assistance(Pt > 75%)  Function- Upper Body Dressing/Undressing What is the patient wearing?: Pull over shirt/dress Pull over shirt/dress - Perfomed by patient: Thread/unthread right sleeve, Thread/unthread left sleeve, Put head through opening, Pull shirt over trunk Assist Level: Supervision or verbal cues Function - Lower Body Dressing/Undressing What is the patient wearing?: Pants, Non-skid slipper socks Position: Wheelchair/chair at sink Pants- Performed by patient: Thread/unthread right pants leg, Thread/unthread left pants leg, Pull pants up/down Non-skid slipper socks- Performed by patient: Don/doff right sock, Don/doff left sock Non-skid slipper socks- Performed by helper: Don/doff right sock, Don/doff left sock Shoes - Performed by patient: Fasten right, Fasten left Shoes - Performed by helper: Don/doff right shoe, Don/doff left shoe Assist for footwear: Supervision/touching assist Assist for lower body dressing: Touching or steadying assistance (Pt > 75%)  Function - Toileting Toileting steps completed by patient: Adjust clothing prior to toileting, Performs perineal hygiene, Adjust clothing after toileting Toileting Assistive Devices: Grab bar or rail Assist level: Touching or steadying assistance (Pt.75%)  Function - Air cabin crew transfer assistive device: Walker, Grab bar Assist level to toilet: Touching or steadying assistance (Pt > 75%) Assist level from toilet: Supervision or verbal cues Assist level to bedside commode (at bedside): Supervision or verbal cues  Function - Chair/bed  transfer Chair/bed transfer method: Stand pivot, Ambulatory Chair/bed transfer assist level: Touching or steadying assistance (Pt > 75%) Chair/bed transfer assistive device: Armrests, Bedrails Chair/bed transfer details: Verbal cues for precautions/safety, Verbal cues for technique  Function - Locomotion: Wheelchair Type: Manual Max wheelchair distance: 32' Assist Level: Moderate assistance (Pt 50 - 74%) Assist Level: Moderate assistance (Pt 50 - 74%) Wheel 150 feet activity did not occur: Safety/medical concerns Turns around,maneuvers to table,bed, and toilet,negotiates 3% grade,maneuvers on rugs and over doorsills: No Function - Locomotion: Ambulation Assistive device: Walker-rolling Max distance: 284f  Assist level: Touching or steadying assistance (Pt > 75%) Assist level: Touching or steadying assistance (Pt > 75%) Walk 50 feet with 2 turns activity did not occur: Safety/medical concerns Assist level: Touching or steadying assistance (Pt > 75%) Walk 150 feet activity did not occur: Safety/medical concerns Assist level: Touching or steadying assistance (Pt > 75%) Walk 10 feet on uneven surfaces activity did not occur: Safety/medical concerns  Function - Comprehension Comprehension: Auditory Comprehension assist level: Understands basic 90% of the time/cues < 10% of the time  Function - Expression Expression: Verbal Expression assist level: Expresses basic needs/ideas: With extra time/assistive device  Function - Social Interaction Social Interaction assist level: Interacts appropriately with others with medication or extra time (anti-anxiety, antidepressant).  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 DVT Prophylaxis/Anticoagulation: Subcutaneous Lovenox.   3. Pain Management/headachesHydrocodone as needed-left shoulder stiffness at night generally improved with  K pad as well as Sportscreme---continue ROM as well   -no hydrocodone since 3/2 4. Mood:Provide emotional support 5. Neuropsych: This patientiscapable of making decisions on hisown behalf. 6. Skin/Wound Care:Routineskin checks 7. Fluids/Electrolytes/Nutrition:Routine I&O's with follow-up chemistries 8.Hypertension.  start to tighten control,   increased lisinopril 5 mg twice daily    -increase hctz to 229mVitals:   10/08/17 0252 10/09/17 0214  BP: (!) 151/98 (!) 143/85  Pulse: (!) 104 82  Resp: 16 16  Temp: 99.3 F (37.4 C) 99 F (37.2 C)  SpO2: 98% 99%     9.Tobacco abuse. NicoDerm patch. Provide counseling 10.Hyperlipidemia. Lipitor  11.  Insomnia improved on trazodone 50 mg nightly as needed---will schedule to see if we can improve further  LOS (Days) 7 A FACE TO FACE EVALUATION WAS PERFORMED  SWARTZ,ZACHARY T 10/09/2017, 8:33 AM

## 2017-10-09 NOTE — Progress Notes (Signed)
Social Work Patient ID: Willie Herrera, male   DOB: Sep 19, 1960, 56 y.o.   MRN: [id number]  Met with pt, wife who is here to observe in therapies and interpreter to inform team conference progression toward his goals and target discharge date 3/13. Both pleased with how well he is doing and progress he is making. He is planning on cooking in the kitchen and hopes the task goes well today. Work on discharge needs for next Wed.

## 2017-10-09 NOTE — Progress Notes (Signed)
Physical Therapy Session Note  Patient Details  Name: Willie Herrera MRN: [id number] Date of Birth: 02/02/1961  Today's Date: 10/09/2017 PT Individual Time: 0932-1100 PT Individual Time Calculation (min): 88 min   Short Term Goals: Week 1:  PT Short Term Goal 1 (Week 1): Pt will be able to demonstrate functional transfers with min assist PT Short Term Goal 2 (Week 1): Pt will be able to gait x 50' with LRAD with min assist PT Short Term Goal 3 (Week 1): Pt will be able to demonstrate functional dynamic standing balance with min assist  Skilled Therapeutic Interventions/Progress Updates:   Pt received supine in bed and agreeable to PT. Supine>sit transfer with supervision assist and min cues   [name] educated in bathroom transfer with RW and min assist. Min cues for proper positioning and how to cue pt for safety and attention to the LLE.   Gait in hall with RW x 233f with supervision assist and min cues for gait pattern and attention to the LUE.   Dynamic gait training weave through 6 cones x 4 without AD and min assist. Min-mod cues from PT for postural control and improved step height on the L. Stepping over 7, 1 inch obstacles x 4 with min assist from PT. Min cues for gait pattern and attention to the LLE.   Reciprocal foot taps on 6 inch step x 10 BLE. latterl foot taps on 6in step with min assist from PT and min cues for improved ROM and upright posture.   Pt reports need for BM. Noted to have incontinent bowel movement once at toilet. PT assisted pt to perform Perineal care to adequate hygiene. Gait back to room. Clothing management to remove soiled underpants with min assist from PT.   Dynamic balance training with min assist on Wii Fit and Biodex to improve use of ankle strategy for weight shifting in all directions with emphasis to R and anteriorly. Penguin slide and LOS exercises x 2 each.   Pt returned to room and performed ambulatory transfer to bed with supervision assist and  RW. Sit>supine completed with supervision assist, and pt left supine in bed with call bell in reach and all needs met.          Therapy Documentation Precautions:  Precautions Precautions: Fall Precaution Comments: mild L inattention; L hemi; maintain systolic BP < 2664Restrictions Weight Bearing Restrictions: No   Pain: Pain Assessment Pain Assessment: No/denies pain Pain Score: 0-No pain   See Function Navigator for Current Functional Status.   Therapy/Group: Individual Therapy  ALorie Phenix3/02/2018, 11:01 AM

## 2017-10-09 NOTE — Progress Notes (Signed)
Occupational Therapy Session Note  Patient Details  Name: Willie Herrera MRN: [id number] Date of Birth: 11/17/1960  Today's Date: 10/09/2017 OT Individual Time: 1330-1415 OT Individual Time Calculation (min): 45 min    Short Term Goals: Week 1:  OT Short Term Goal 1 (Week 1): Pt will transfer in and out of bath tub with LRAD with min  A OT Short Term Goal 2 (Week 1): Pt will perform LB dressing with min A - sit to stand OT Short Term Goal 3 (Week 1): Pt will use right hand at nondominant level in ADL tasks with min  A OT Short Term Goal 4 (Week 1): Pt will don shirt with setup with cues  Skilled Therapeutic Interventions/Progress Updates:    Pt seen for OT session focusing on functional mobility and transfers. Pt in supine upon arrival with wife present, agreeable to tx session and denying pain. He ambulated throughout session overall supervision using RW< occasional min A to remain in middle of RW and cuing for RW management in functional context.  In ADL apartment, he completed kitchen mobility accessing refrigerator, overhead cabinets and low shelves to obtain items in prep for cooking task tomorrow. He was able to recall safety techniques taught in yesterday's PT session.  He then completed simulated shower stall transfer, pt able to recall with min cuing technique for transfer with education provided by therapist to pt's wife regarding proper technique for shower stall transfer using RW as well as recommendation for supervision during all bathing and shower transfers at d/c, pt and wife voiced understanding.  In therapy gym, pt completed floor transfer in simulation of fall. Extensive education provided regarding pt's high fall risk, what to do in case of fall including calling 911 if injury is suspected and recommendation for pt to carry cell phone with him at all times. Pt returned to room at end of session, returned to supine and left with all needs in reach, bed alarm on and family present.    Therapy Documentation Precautions:  Precautions Precautions: Fall Precaution Comments: mild L inattention; L hemi; maintain systolic BP < 220 Restrictions Weight Bearing Restrictions: No Pain:    No/denies pain ADL: ADL ADL Comments: see functional navigator  See Function Navigator for Current Functional Status.   Therapy/Group: Individual Therapy  [name] L 10/09/2017, 6:55 AM

## 2017-10-10 ENCOUNTER — Inpatient Hospital Stay (HOSPITAL_COMMUNITY): Payer: Self-pay | Admitting: Physical Therapy

## 2017-10-10 ENCOUNTER — Inpatient Hospital Stay (HOSPITAL_COMMUNITY): Payer: Self-pay | Admitting: Occupational Therapy

## 2017-10-10 MED ORDER — GUAIFENESIN 100 MG/5ML PO SOLN
10.0000 mL | Freq: Every evening | ORAL | Status: DC | PRN
  Administered 2017-10-10 – 2017-10-14 (×2): 200 mg via ORAL
  Filled 2017-10-10 (×2): qty 10

## 2017-10-10 NOTE — Plan of Care (Signed)
  Progressing Consults RH STROKE PATIENT EDUCATION Description See Patient Education module for education specifics  10/10/2017 1211 - Progressing by Ronnette JuniperPerkins, Rozell Kettlewell E, LPN RH SAFETY RH STG ADHERE TO SAFETY PRECAUTIONS W/ASSISTANCE/DEVICE Description STG Adhere to Safety Precautions With Mod I Assistance/Device.  10/10/2017 1211 - Progressing by Ronnette JuniperPerkins, Arlone Lenhardt E, LPN RH COGNITION-NURSING RH STG ANTICIPATES NEEDS/CALLS FOR ASSIST W/ASSIST/CUES Description STG Anticipates Needs/Calls for Assist With Mod I Assistance/Cues.  10/10/2017 1211 - Progressing by Ronnette JuniperPerkins, Chala Gul E, LPN RH KNOWLEDGE DEFICIT RH STG INCREASE KNOWLEDGE OF HYPERTENSION Description Pt will be able to verbalize 2 ways to manage blood pressure at the time of discharge with min cues.   10/10/2017 1211 - Progressing by Ronnette JuniperPerkins, Khristina Janota E, LPN Spiritual Needs Ability to function at adequate level 10/10/2017 1211 - Progressing by Ronnette JuniperPerkins, Angelis Gates E, LPN

## 2017-10-10 NOTE — Progress Notes (Signed)
Occupational Therapy Weekly Progress Note  Patient Details  Name: Willie Herrera MRN: [id number] Date of Birth: 08-Jun-1961  Beginning of progress report period: October 03, 2017 End of progress report period: October 10, 2017   Patient has met 4 of 4 short term goals. Pt agreeable and participates in all therapeutic sessions. He has progressed with ADL bathing/dressing sessions with endurance, fine motor and gross motor functions, however still requires Min A VC's due to impaired safety awareness. [name] has been present for most sessions and has been educated on pt care and safety precautions after D/C.   Patient continues to demonstrate the following deficits: hemiplegia affecting non-dominant side and muscle weakness (generalized)  and therefore will continue to benefit from skilled OT intervention to enhance overall performance with BADL and Reduce care partner burden.  Patient progressing toward long term goals..  Continue plan of care.  OT Short Term Goals Week 1:  OT Short Term Goal 1 (Week 1): Pt will transfer in and out of bath tub with LRAD with min  A OT Short Term Goal 1 - Progress (Week 1): Met OT Short Term Goal 2 (Week 1): Pt will perform LB dressing with min A - sit to stand OT Short Term Goal 2 - Progress (Week 1): Met OT Short Term Goal 3 (Week 1): Pt will use right hand at nondominant level in ADL tasks with min  A OT Short Term Goal 3 - Progress (Week 1): Met OT Short Term Goal 4 (Week 1): Pt will don shirt with setup with cues OT Short Term Goal 4 - Progress (Week 1): Met Week 2:  OT Short Term Goal 1 (Week 2): STG=LTG due to LOS   Therapy Documentation Precautions:  Precautions Precautions: Fall Precaution Comments: mild L inattention; L hemi; maintain systolic BP < 532 Restrictions Weight Bearing Restrictions: No     Vilinda Blanks 10/10/2017, 3:14 PM

## 2017-10-10 NOTE — Progress Notes (Signed)
Physical Therapy Weekly Progress Note  Patient Details  Name: Willie Herrera MRN: [id number] Date of Birth: 1960-10-01  Beginning of progress report period: October 03, 2017 End of progress report period: October 10, 2017  Today's Date: 10/10/2017 PT Individual Time: 0905-1005 1300-1415 PT Individual Time Calculation (min): 60 min 75 min   Patient has met 3 of 3 short term goals.  Pt making good progress towards long term goals. Pt currently functioning at min-supervision assist level for transfers, gait, and bed mobility and no AD Improved attention to the LLE and LUE throughout course of the week   Patient continues to demonstrate the following deficits muscle weakness, impaired timing and sequencing, unbalanced muscle activation and decreased coordination, decreased attention to left and decreased standing balance, decreased postural control, hemiplegia and decreased balance strategies and therefore will continue to benefit from skilled PT intervention to increase functional independence with mobility.  Patient progressing toward long term goals..  Continue plan of care.  PT Short Term Goals Week 1:  PT Short Term Goal 1 (Week 1): Pt will be able to demonstrate functional transfers with min assist PT Short Term Goal 1 - Progress (Week 1): Met PT Short Term Goal 2 (Week 1): Pt will be able to gait x 50' with LRAD with min assist PT Short Term Goal 2 - Progress (Week 1): Met PT Short Term Goal 3 (Week 1): Pt will be able to demonstrate functional dynamic standing balance with min assist PT Short Term Goal 3 - Progress (Week 1): Met Week 2:  PT Short Term Goal 1 (Week 2): STG= LTG due to ELOS  Skilled Therapeutic Interventions/Progress Updates:   Pt received supine in bed and agreeable to PT. Supine>sit transfer without assist or cues.   [name] assisted pt with donning shoes and socks for time management. Sit<>stand at edge of bed with supervision assist RW.   Gait training through hall of  rehab unit with RW and supervision assist x 243f with min cues for attention to the LLE and improved step length.   Dynamic balance training instructed by PT:  Reciprocal foot taps on 6 inch step x 12 BLE.  Lateral foot taps on 6 inch step x 12 BLE.  Sit<>stand with RLE on 4 inch step 2 x 12.  Lateral reach L and R with R LE on 4 inch step to force WB through LLE.   Dynavision dynamic balance training with forced use of LUE. Program A x 3 on level surface, x 2 on airex and x 2 on airex with Tscope; all with LUE reaching only. Supervision assist from PT overall with decreased reaction time with reach new level of difficulty.   Kinetron reciprocal marches x 2 30 seconds in standing 40cm/sec. Attempt to maintian static balance on 40cm/sec with equal WB, required min-mod assist to prevent L lateral LOB.   Pt reports need for BM. Returned to room with gait training; supervision assist from PT without AD x 1534f Toilet transfer and peri care with supervision assist.    [facility] and all needs met.    Session 2.   Pt received sitting in WC and agreeable to PT  PT transported pt to Hospital gift shop. Gait training in simulated community environment 15011f 2. With supervision assist.   Gait through various zones of hospital without AD and  supervision - min assist assist x [id number] and throughout rehab unit [id number].  Dynamic balance training with agility agility ladder:  One foot in each space x 4  Side stepping x 2 R and L.  Min assist throughout Agility ladder balance activities and min cues for   Ball toss sitting progress to standing on airex with min assist from with min cues for improved use of the LUE and use of ankle strategy to prevent LOB.   Nustep reciprocal movement training x 6 minutes level 5, min cues for attention to LUE, improved control of the LLE   Gait training with SPC x176f and x 1576f  Supervision-min assist from PT with min cues for AD management and improved foot clearance. .   [facility] and all needs met.         Therapy Documentation Precautions:  Precautions Precautions: Fall Precaution Comments: mild L inattention; L hemi; maintain systolic BP < 22122estrictions Weight Bearing Restrictions: No General:   Vital Signs:   Pain: Pain Assessment Pain Assessment: 0-10 Pain Score: 0-No pain Vision/Perception     Mobility:   Locomotion :    Trunk/Postural Assessment :    Balance:   Exercises:   Other Treatments:     See Function Navigator for Current Functional Status.  Therapy/Group: Individual Therapy  AuLorie Phenix/03/2018, 10:07 AM

## 2017-10-10 NOTE — Progress Notes (Signed)
Subjective/Complaints: Slept much better with scheduled trazodone.   ROS: pt denies nausea, vomiting, diarrhea, cough, shortness of breath or chest pain   Objective: Vital Signs: Blood pressure 126/74, pulse 87, temperature 97.7 F (36.5 C), temperature source Oral, resp. rate 18, height _0  (1.626 m), weight 85 kg (187 lb 6.3 oz), SpO2 100 %. No results found. Results for orders placed or performed during the hospital encounter of 10/02/17 (from the past 72 hour(s))  Creatinine, serum     Status: Abnormal   Collection Time: 10/09/17  6:41 AM  Result Value Ref Range   Creatinine, Ser 1.45 (H) 0.61 - 1.24 mg/dL   GFR calc non Af Amer 53 (L) >60 mL/min   GFR calc Af Amer >60 >60 mL/min    Comment: (NOTE) The eGFR has been calculated using the CKD EPI equation. This calculation has not been validated in all clinical situations. eGFR's persistently <60 mL/min signify possible Chronic Kidney Disease. Performed at [facility], [place] [address], [place], [place] [address]      HEENT: normal Cardio: RRR without murmur. No JVD   Resp: CTA Bilaterally without wheezes or rales. Normal effort   GI: BS +, non-tender, non-distended  Extremity:  Pulses positive and No Edema Skin:   Intact Neuro: Alert/Oriented, Normal Sensory, Abnormal Motor 3+ to 4-/5 L deltoid, bicep, tricep, grip, 4+/5 in left hip flexor knee extensor ankle dorsiflexor 5/5 in right upper and right lower limb and improving FMC LLE Musc/Skel:  Minimal left shoulder pain General no acute distress   Assessment/Plan: 1. 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 Physiatrist is providing close team supervision and 24 hour management of active medical problems listed below. Physiatrist and rehab team continue to assess barriers to discharge/monitor  patient progress toward functional and medical goals. FIM: Function - Bathing Position: Shower Body parts bathed by patient: Right arm, Left arm, Chest, Abdomen, Front perineal area, Buttocks, Right upper leg, Left upper leg, Right lower leg, Left lower leg, Back Body parts bathed by helper: Back Assist Level: Supervision or verbal cues  Function- Upper Body Dressing/Undressing What is the patient wearing?: Pull over shirt/dress Pull over shirt/dress - Perfomed by patient: Thread/unthread right sleeve, Thread/unthread left sleeve, Put head through opening Assist Level: Supervision or verbal cues Function - Lower Body Dressing/Undressing What is the patient wearing?: Underwear, Pants, Socks Position: Other (comment)(RTS in bathroom ) Underwear - Performed by patient: Thread/unthread right underwear leg, Thread/unthread left underwear leg, Pull underwear up/down Pants- Performed by patient: Thread/unthread right pants leg, Thread/unthread left pants leg, Pull pants up/down Non-skid slipper socks- Performed by patient: Don/doff right sock, Don/doff left sock Non-skid slipper socks- Performed by helper: Don/doff right sock, Don/doff left sock Socks - Performed by patient: Don/doff right sock, Don/doff left sock Shoes - Performed by patient: Fasten right, Fasten left Shoes - Performed by helper: Don/doff right shoe, Don/doff left shoe Assist for footwear: Supervision/touching assist Assist for lower body dressing: Supervision or verbal cues  Function - Toileting Toileting steps completed by patient: Adjust clothing prior to toileting, Performs perineal hygiene, Adjust clothing after toileting Toileting Assistive Devices: Grab bar or rail Assist level: Supervision or verbal cues  Function - Air cabin crew transfer assistive device: Grab bar Assist level to toilet: Supervision or verbal cues Assist level from toilet: Supervision or verbal cues Assist level to bedside commode (at  bedside): Supervision or verbal cues  Function - Chair/bed transfer Chair/bed transfer method: Stand pivot Chair/bed transfer assist level: Supervision or verbal cues Chair/bed transfer assistive device: Armrests, Walker Chair/bed transfer details: Verbal cues for precautions/safety, Verbal cues for technique  Function - Locomotion: Wheelchair Type: Manual Max wheelchair distance: 50' Assist Level: Moderate assistance (Pt 50 - 74%) Assist Level: Moderate assistance (Pt 50 - 74%) Wheel 150 feet activity did not occur: Safety/medical concerns Turns around,maneuvers to table,bed, and toilet,negotiates 3% grade,maneuvers on rugs and over doorsills: No Function - Locomotion: Ambulation Assistive device: Walker-rolling Max distance: 238f  Assist level: Supervision or verbal cues Assist level: Supervision or verbal cues Walk 50 feet with 2 turns activity did not occur: Safety/medical concerns Assist level: Supervision or verbal cues Walk 150 feet activity did not occur: Safety/medical concerns Assist level: Supervision or verbal cues Walk 10 feet on uneven surfaces activity did not occur: Safety/medical concerns  Function - Comprehension Comprehension: Auditory Comprehension assist level: Understands basic 90% of the time/cues < 10% of the time  Function - Expression Expression: Verbal Expression assist level: Expresses basic needs/ideas: With extra time/assistive device  Function - Social Interaction Social Interaction assist level: Interacts appropriately with others with medication or extra time (anti-anxiety, antidepressant).  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 DVT Prophylaxis/Anticoagulation: Subcutaneous Lovenox.   3. Pain Management/headachesHydrocodone as needed-left shoulder stiffness at night generally improved with  K pad as well as Sportscreme---continue ROM as well   -minimal use of hydrocodone 4. Mood:Provide emotional support 5. Neuropsych: This patientiscapable of making decisions on hisown behalf. 6. Skin/Wound Care:Routineskin checks 7. Fluids/Electrolytes/Nutrition:Routine I&O's with follow-up chemistries 8.Hypertension.    increased lisinopril 5 mg twice daily    -increased hctz to 215m  -bp's under improved control Vitals:   10/09/17 2148 10/10/17 0500  BP: 115/79 126/74  Pulse: 85 87  Resp:  18  Temp:  97.7 F (36.5 C)  SpO2:  100%     9.Tobacco abuse. NicoDerm patch. Provide counseling 10.Hyperlipidemia. Lipitor  11.  Insomnia improved on trazodone 50 mg QHS (scheduled)  LOS (Days) 8 A FACE TO FACE EVALUATION WAS PERFORMED  Julyana Woolverton T 10/10/2017, 8:45 AM

## 2017-10-10 NOTE — Progress Notes (Signed)
Occupational Therapy Session Note  Patient Details  Name: Rolene CourseLwin Lavell MRN: [id number] Date of Birth: 11/03/1960  Today's Date: 10/10/2017 OT Individual Time: 1100-1208 OT Individual Time Calculation (min): 68 min    Short Term Goals: Week 1:  OT Short Term Goal 1 (Week 1): Pt will transfer in and out of bath tub with LRAD with min  A OT Short Term Goal 2 (Week 1): Pt will perform LB dressing with min A - sit to stand OT Short Term Goal 3 (Week 1): Pt will use right hand at nondominant level in ADL tasks with min  A OT Short Term Goal 4 (Week 1): Pt will don shirt with setup with cues  Skilled Therapeutic Interventions/Progress Updates:  Pt participated in skilled cooking session in therapy gym along with wife and interrupter . Pt ambulated to rehab gym W/O RW however C/O weakness in R LE so RW was reintroduced. Pt was able to gather cooking ingredients and tools, however required Mod VC's for safety to properly stay in RW and to slow down with motions during activity. Due to LUE inattention and poor sensation, education was given to the pt and wife on body position and awareness on the LUE while cooking as well as recommending wife be present during cooking to help with stove cooking and cutting foods. Pt required one rest break during session, education on energy conservation was given to the pt and wife. Pt was left in rehab apartment in W/C with wife to allow the food to be finished in the oven, OTAS continued to check on him until PT arrived for therapy session.          Therapy Documentation Precautions:  Precautions Precautions: Fall Precaution Comments: mild L inattention; L hemi; maintain systolic BP < 220 Restrictions Weight Bearing Restrictions: No General:      Therapy/Group: Individual Therapy  Baruch GoutyMary Lesha Jager 10/10/2017, 1:11 PM

## 2017-10-11 ENCOUNTER — Encounter (HOSPITAL_COMMUNITY): Payer: Self-pay | Admitting: Physical Medicine & Rehabilitation

## 2017-10-11 ENCOUNTER — Inpatient Hospital Stay (HOSPITAL_COMMUNITY): Payer: Self-pay | Admitting: Physical Therapy

## 2017-10-11 DIAGNOSIS — R7303 Prediabetes: Secondary | ICD-10-CM

## 2017-10-11 DIAGNOSIS — N183 Chronic kidney disease, stage 3 unspecified: Secondary | ICD-10-CM

## 2017-10-11 DIAGNOSIS — N179 Acute kidney failure, unspecified: Secondary | ICD-10-CM

## 2017-10-11 DIAGNOSIS — R05 Cough: Secondary | ICD-10-CM

## 2017-10-11 NOTE — Plan of Care (Signed)
  Progressing Consults RH STROKE PATIENT EDUCATION Description See Patient Education module for education specifics  10/11/2017 [phone] - Progressing by Ronnette JuniperPerkins, Wm Sahagun E, LPN RH SAFETY RH STG ADHERE TO SAFETY PRECAUTIONS W/ASSISTANCE/DEVICE Description STG Adhere to Safety Precautions With Mod I Assistance/Device.  10/11/2017 0948 - Progressing by Ronnette JuniperPerkins, Milad Bublitz E, LPN RH COGNITION-NURSING RH STG ANTICIPATES NEEDS/CALLS FOR ASSIST W/ASSIST/CUES Description STG Anticipates Needs/Calls for Assist With Mod I Assistance/Cues.  10/11/2017 0948 - Progressing by Ronnette JuniperPerkins, Marnee Sherrard E, LPN RH KNOWLEDGE DEFICIT RH STG INCREASE KNOWLEDGE OF HYPERTENSION Description Pt will be able to verbalize 2 ways to manage blood pressure at the time of discharge with min cues.   10/11/2017 0948 - Progressing by Ronnette JuniperPerkins, Kylen Ismael E, LPN Spiritual Needs Ability to function at adequate level 10/11/2017 0948 - Progressing by Ronnette JuniperPerkins, Licia Harl E, LPN

## 2017-10-11 NOTE — Progress Notes (Signed)
Physical Therapy Session Note  Patient Details  Name: Willie Herrera MRN: [id number] Date of Birth: 1960-12-23  Today's Date: 10/11/2017 PT Individual Time: 1105-1205 PT Individual Time Calculation (min): 60 min   Short Term Goals: Week 2:  PT Short Term Goal 1 (Week 2): STG= LTG due to ELOS  Skilled Therapeutic Interventions/Progress Updates:   Pt received supine in bed and agreeable to PT. Supine>sit transfer without  Cues or assist  Gait in hall x 237f, 1552f and 18063fith supervision assist from PT min cues for improved weight shift R to allow increased step height on theL. .   Dynamic standing balance:  Toe tap on 6 inch cone. Min assist to improve postural alignment and weight shifting to the R.  4 square balance activity. Forward/backward x 12. R/L x 12. Box step x 10 each direction. Min-supervision assist. From PT with cues for posture, improved step length and use of UE to counter balance  And prevent LOB.   Cross body and Lateral reaching to the L. To place and remove clothes pins from basketball goal x 20. Min cues for improve weight shifting and posture.   Kinetron standing balance 4 x 1 min with min assist to prevent L lateral LOB. Reciprocal marching 4 x 30 seconds with BUE support. minisquat x 10 on kinetron with min assist from PT to facilitate improved weight shift to the R.   Patient returned to room and [facility] and all needs met.             Therapy Documentation Precautions:  Precautions Precautions: Fall Precaution Comments: mild L inattention; L hemi; maintain systolic BP < 220527strictions Weight Bearing Restrictions: No    Vital Signs:   Pain: Denies.   See Function Navigator for Current Functional Status.   Therapy/Group: Individual Therapy  AusLorie Phenix9/2019, 12:44 PM

## 2017-10-11 NOTE — Progress Notes (Signed)
Patient reports coughing over past 3 days. Reports keeps him awake at night. Paged Dr. Allena KatzPatel, orders received. Scheduled trazodone given at 2058, not effective.  PRN robitussin given at 2254. Requested pain med, PRN vicodin given at 2254. Rested after vicodin. Alfredo MartinezMurray, Kenise Barraco A

## 2017-10-11 NOTE — Progress Notes (Signed)
Subjective/Complaints: Patient seen lying in bed this morning. Received calls from nursing regarding cough. Additional medication added last night and patient states she slept well after receiving the medications. This morning he states his cough is significantly improved.  ROS: Denies CP, SOB, nausea, vomiting, diarrhea.  Objective: Vital Signs: Blood pressure 124/83, pulse 62, temperature 97.7 F (36.5 C), temperature source Oral, resp. rate 18, height 5' 4"  (1.626 m), weight 85 kg (187 lb 6.3 oz), SpO2 99 %. No results found. Results for orders placed or performed during the hospital encounter of 10/02/17 (from the past 72 hour(s))  Creatinine, serum     Status: Abnormal   Collection Time: 10/09/17  6:41 AM  Result Value Ref Range   Creatinine, Ser 1.45 (H) 0.61 - 1.24 mg/dL   GFR calc non Af Amer 53 (L) >60 mL/min   GFR calc Af Amer >60 >60 mL/min    Comment: (NOTE) The eGFR has been calculated using the CKD EPI equation. This calculation has not been validated in all clinical situations. eGFR's persistently <60 mL/min signify possible Chronic Kidney Disease. Performed at [facility], [place] [address] [place], [place] [address]      HEENT: Normocephalic. Atraumatic. Cardio: RRR. No JVD   Resp: CTA Bilaterally. Normal effort   GI: BS +, non-distended  Skin:   Intact. Warm and dry. Neuro: Alert/Oriented,  Motor: 4-/5 L deltoid, bicep, tricep, grip, 4/5 in left hip flexor knee extensor ankle dorsiflexor 5/5 in right upper and right lower limb and improving FMC LLE Musc/Skel:  No tenderness. No edema. General no acute distress. Vital signs reviewed.   Assessment/Plan: 1. 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 Physiatrist is providing close team supervision and 24 hour management of active  medical problems listed below. Physiatrist and rehab team continue to assess barriers to discharge/monitor patient progress toward functional and medical goals. FIM: Function - Bathing Position: Shower Body parts bathed by patient: Right arm, Left arm, Chest, Abdomen, Front perineal area, Buttocks, Right upper leg, Left upper leg, Right lower leg, Left lower leg, Back Body parts bathed by helper: Back Assist Level: Supervision or verbal cues  Function- Upper Body Dressing/Undressing What is the patient wearing?: Pull over shirt/dress Pull over shirt/dress - Perfomed by patient: Thread/unthread right sleeve, Thread/unthread left sleeve, Put head through opening Assist Level: Supervision or verbal cues Function - Lower Body Dressing/Undressing What is the patient wearing?: Underwear, Pants, Socks Position: Other (comment)(RTS in bathroom ) Underwear - Performed by patient: Thread/unthread right underwear leg, Thread/unthread left underwear leg, Pull underwear up/down Pants- Performed by patient: Thread/unthread right pants leg, Thread/unthread left pants leg, Pull pants up/down Non-skid slipper socks- Performed by patient: Don/doff right sock, Don/doff left sock Non-skid slipper socks- Performed by helper: Don/doff right sock, Don/doff left sock Socks - Performed by patient: Don/doff right sock, Don/doff left sock Shoes - Performed by patient: Fasten right, Fasten left Shoes - Performed by helper: Don/doff right shoe, Don/doff left shoe Assist for footwear: Supervision/touching assist Assist for lower body dressing: Supervision or verbal cues  Function - Toileting Toileting steps completed by patient: Adjust clothing prior to toileting, Performs perineal hygiene, Adjust clothing after toileting Toileting Assistive Devices: Grab bar or rail Assist level: Supervision or verbal cues  Function - Air cabin crew transfer assistive device: Grab bar Assist level to toilet: Supervision  or verbal cues Assist  level from toilet: Supervision or verbal cues Assist level to bedside commode (at bedside): Supervision or verbal cues  Function - Chair/bed transfer Chair/bed transfer method: Stand pivot Chair/bed transfer assist level: Supervision or verbal cues Chair/bed transfer assistive device: Armrests, Walker Chair/bed transfer details: Verbal cues for precautions/safety, Verbal cues for technique  Function - Locomotion: Wheelchair Type: Manual Max wheelchair distance: 50' Assist Level: Moderate assistance (Pt 50 - 74%) Assist Level: Moderate assistance (Pt 50 - 74%) Wheel 150 feet activity did not occur: Safety/medical concerns Turns around,maneuvers to table,bed, and toilet,negotiates 3% grade,maneuvers on rugs and over doorsills: No Function - Locomotion: Ambulation Assistive device: No device Max distance: 167f  Assist level: Supervision or verbal cues Assist level: Supervision or verbal cues Walk 50 feet with 2 turns activity did not occur: Safety/medical concerns Assist level: Supervision or verbal cues Walk 150 feet activity did not occur: Safety/medical concerns Assist level: Supervision or verbal cues Walk 10 feet on uneven surfaces activity did not occur: Safety/medical concerns  Function - Comprehension Comprehension: Auditory Comprehension assist level: Understands basic 90% of the time/cues < 10% of the time  Function - Expression Expression: Verbal Expression assist level: Expresses basic needs/ideas: With no assist  Function - Social Interaction Social Interaction assist level: Interacts appropriately with others - No medications needed.  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 DVT Prophylaxis/Anticoagulation: Subcutaneous Lovenox.   3. Pain Management/headachesHydrocodone as needed   -minimal use of hydrocodone 4. Mood:Provide emotional support 5. Neuropsych: This patientiscapable of making decisions on hisown behalf. 6. Skin/Wound Care:Routineskin checks 7. Fluids/Electrolytes/Nutrition:Routine I&O's with follow-up chemistries 8.Hypertension.    increased lisinopril 5 mg twice daily    -increased hctz to 260m  Relatively controlled on 3/9 Vitals:   10/11/17 0441 10/11/17 0801  BP: 126/87 124/83  Pulse: 62   Resp: 18   Temp: 97.7 F (36.5 C)   SpO2: 99%      9.Tobacco abuse. NicoDerm patch. Provide counseling 10.Hyperlipidemia. Lipitor 11.  Insomnia improved on trazodone 50 mg QHS (scheduled) 12. AKI on CKD   Creatinine 1.45 on 3/7   Labs ordered for Monday   Encourage fluids 13. Prediabetes   Continue to monitor  LOS (Days) 9 A FACE TO FACE EVALUATION WAS PERFORMED  Jozy Mcphearson AnLorie Phenix/04/2018, 11:26 AM

## 2017-10-12 ENCOUNTER — Inpatient Hospital Stay (HOSPITAL_COMMUNITY): Payer: Self-pay | Admitting: Occupational Therapy

## 2017-10-12 NOTE — Progress Notes (Signed)
Subjective/Complaints: Patient seen lying in bed this morning. He states that he slept better overnight and that his cough is also better.  ROS: Denies CP, SOB, nausea, vomiting, diarrhea.  Objective: Vital Signs: Blood pressure 124/79, pulse 72, temperature 97.9 F (36.6 C), temperature source Oral, resp. rate 18, height 5\' 4"  (1.626 m), weight 85 kg (187 lb 6.3 oz), SpO2 98 %. No results found. No results found for this or any previous visit (from the past 72 hour(s)).   HEENT: Normocephalic. Atraumatic. Cardio: RRR. No JVD   Resp: CTA Bilaterally. Normal effort   GI: BS +, non-distended  Skin:   Intact. Warm and dry. Neuro: Alert/Oriented,  Motor: 4/5 L deltoid, bicep, tricep, grip, 4/5 in left hip flexor knee extensor ankle dorsiflexor Musc/Skel:  No tenderness. No edema. General no acute distress. Vital signs reviewed.   Assessment/Plan: 1. 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 Physiatrist is providing close team supervision and 24 hour management of active medical problems listed below. Physiatrist and rehab team continue to assess barriers to discharge/monitor patient progress toward functional and medical goals. FIM: Function - Bathing Position: Shower Body parts bathed by patient: Right arm, Left arm, Chest, Abdomen, Front perineal area, Buttocks, Right upper leg, Left upper leg, Right lower leg, Left lower leg, Back Body parts bathed by helper: Back Assist Level: Supervision or verbal cues  Function- Upper Body Dressing/Undressing What is the patient wearing?: Pull over shirt/dress Pull over shirt/dress - Perfomed by patient: Thread/unthread right sleeve, Thread/unthread left sleeve, Put head through opening Assist Level: Supervision or verbal cues Function - Lower Body  Dressing/Undressing What is the patient wearing?: Underwear, Pants, Socks Position: Other (comment)(RTS in bathroom ) Underwear - Performed by patient: Thread/unthread right underwear leg, Thread/unthread left underwear leg, Pull underwear up/down Pants- Performed by patient: Thread/unthread right pants leg, Thread/unthread left pants leg, Pull pants up/down Non-skid slipper socks- Performed by patient: Don/doff right sock, Don/doff left sock Non-skid slipper socks- Performed by helper: Don/doff right sock, Don/doff left sock Socks - Performed by patient: Don/doff right sock, Don/doff left sock Shoes - Performed by patient: Fasten right, Fasten left Shoes - Performed by helper: Don/doff right shoe, Don/doff left shoe Assist for footwear: Supervision/touching assist Assist for lower body dressing: Supervision or verbal cues  Function - Toileting Toileting steps completed by patient: Adjust clothing prior to toileting, Performs perineal hygiene, Adjust clothing after toileting Toileting Assistive Devices: Grab bar or rail Assist level: Touching or steadying assistance (Pt.75%)  Function - ArchivistToilet Transfers Toilet transfer assistive device: Grab bar Assist level to toilet: Supervision or verbal cues Assist level from toilet: Supervision or verbal cues Assist level to bedside commode (at bedside): Supervision or verbal cues  Function - Chair/bed transfer Chair/bed transfer method: Ambulatory Chair/bed transfer assist level: Supervision or verbal cues Chair/bed transfer assistive device: Armrests, Walker Chair/bed transfer details: Verbal cues for precautions/safety, Verbal cues for technique  Function - Locomotion: Wheelchair Type: Manual Max wheelchair distance: 50' Assist Level: Moderate assistance (Pt 50 - 74%) Assist Level: Moderate assistance (Pt 50 - 74%) Wheel 150 feet activity did not occur: Safety/medical concerns Turns around,maneuvers to table,bed, and toilet,negotiates 3%  grade,maneuvers on rugs and over doorsills: No Function - Locomotion: Ambulation Assistive device: No device Max distance: 210200ft  Assist level: Supervision or verbal cues Assist level: Supervision or verbal cues Walk 50 feet with 2 turns  activity did not occur: Safety/medical concerns Assist level: Supervision or verbal cues Walk 150 feet activity did not occur: Safety/medical concerns Assist level: Supervision or verbal cues Walk 10 feet on uneven surfaces activity did not occur: Safety/medical concerns  Function - Comprehension Comprehension: Auditory Comprehension assist level: Understands basic 90% of the time/cues < 10% of the time  Function - Expression Expression: Verbal Expression assist level: Expresses basic needs/ideas: With no assist  Function - Social Interaction Social Interaction assist level: Interacts appropriately with others - No medications needed.  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 DVT Prophylaxis/Anticoagulation: Subcutaneous Lovenox.   3. Pain Management/headachesHydrocodone as needed   -minimal use of hydrocodone 4. Mood:Provide emotional support 5. Neuropsych: This patientiscapable of making decisions on hisown behalf. 6. Skin/Wound Care:Routineskin checks 7. Fluids/Electrolytes/Nutrition:Routine I&O's with follow-up chemistries 8.Hypertension.    increased lisinopril 5 mg twice daily    -increased hctz to 25mg    Labile, but overall controlled on 3/10 Vitals:   10/11/17 1946 10/12/17 0446  BP: (!) 146/80 124/79  Pulse: 77 72  Resp:  18   Temp:  97.9 F (36.6 C)  SpO2:  98%     9.Tobacco abuse. NicoDerm patch. Provide counseling 10.Hyperlipidemia. Lipitor 11.  Insomnia improved on trazodone 50 mg QHS (scheduled) 12. AKI on CKD   Creatinine 1.45 on 3/7   Labs ordered for tomorrow   Encourage fluids 13. Prediabetes   Continue to monitor 13. Cough   Potential drug allergy, however appears to be improving  LOS (Days) 10 A FACE TO FACE EVALUATION WAS PERFORMED  Leanda Padmore Karis Juba 10/12/2017, 7:59 AM

## 2017-10-12 NOTE — Plan of Care (Signed)
  Progressing Consults RH STROKE PATIENT EDUCATION Description See Patient Education module for education specifics  10/12/2017 0402 - Progressing by Martina SinnerMurray, Delonte Musich A, RN RH SAFETY RH STG ADHERE TO SAFETY PRECAUTIONS W/ASSISTANCE/DEVICE Description STG Adhere to Safety Precautions With Mod I Assistance/Device.  10/12/2017 0402 - Progressing by Martina SinnerMurray, Viaan Knippenberg A, RN RH COGNITION-NURSING RH STG ANTICIPATES NEEDS/CALLS FOR ASSIST W/ASSIST/CUES Description STG Anticipates Needs/Calls for Assist With Mod I Assistance/Cues.  10/12/2017 0402 - Progressing by Martina SinnerMurray, Rakim Moone A, RN RH KNOWLEDGE DEFICIT RH STG INCREASE KNOWLEDGE OF HYPERTENSION Description Pt will be able to verbalize 2 ways to manage blood pressure at the time of discharge with min cues.   10/12/2017 0402 - Progressing by Martina SinnerMurray, Theona Muhs A, RN

## 2017-10-12 NOTE — Progress Notes (Signed)
Occupational Therapy Session Note  Patient Details  Name: [name] MRN: [id number] Date of Birth: 02/22/1961  Today's Date: 10/12/2017 OT Individual Time: 1100-1200 OT Individual Time Calculation (min): 60 min    Short Term Goals: Week 2:  OT Short Term Goal 1 (Week 2): STG=LTG due to LOS  Skilled Therapeutic Interventions/Progress Updates:    Pt seen for OT ADL bathing/dressing session, fine motor coordination, and dynamic standing balance/endurace.. Pt in supine upon arrival with wife and interpreter present, pt ready for tx session and deisring to bathe at shower level. He ambulated throughout session without AD, overall supervision, occasional guarding asiist when ambulating quickly around turns due to minaml LOB.  He bathed with supervision seated on tub bench, standing with use of grab bars to compelte pericare/buttock hygiene. He dressed seated on Toilet, VCs for safety awareness to complete dynamic dressing tasks from seated position in order to reduce fall risk and increase indpenednence with dressing tasks.  He ambulated to therapy gym. Seated EOM, completed large and small button management activity, with increased time demonstrates ability to mange both size buttons independently.  He then participated in [place] game seated EOM focusing on in-hand manipulation of tokens and L attention. Standing ball toss while standing on non-compliant foam mat surface, one posterior LOB episode requiring total A for controlled descent back onto mat, pt without awareness/ self correction with LOB episode.  He ambulated back to room at end of session, tossing ball with interpretter with min A for balancne. Pt left seated EOB at end of session, set-up with meal tray, all needs in reach and bed alarm on.   Therapy Documentation Precautions:  Precautions Precautions: Fall Precaution Comments: mild L inattention; L hemi; maintain systolic BP < 220 Restrictions Weight Bearing Restrictions:  No Pain:   No/denies pain ADL: ADL ADL Comments: see functional navigator  See Function Navigator for Current Functional Status.   Therapy/Group: Individual Therapy  Willie Herrera L 10/12/2017, 6:54 AM

## 2017-10-12 NOTE — Progress Notes (Signed)
Better night, still complains of coughing. No PRN meds given. Willie MartinezMurray, Esmond Hinch A

## 2017-10-13 ENCOUNTER — Inpatient Hospital Stay (HOSPITAL_COMMUNITY): Payer: Self-pay | Admitting: Physical Therapy

## 2017-10-13 ENCOUNTER — Inpatient Hospital Stay (HOSPITAL_COMMUNITY): Payer: Self-pay

## 2017-10-13 ENCOUNTER — Inpatient Hospital Stay (HOSPITAL_COMMUNITY): Payer: Self-pay | Admitting: Occupational Therapy

## 2017-10-13 LAB — BASIC METABOLIC PANEL
ANION GAP: 10 (ref 5–15)
BUN: 22 mg/dL — ABNORMAL HIGH (ref 6–20)
CO2: 24 mmol/L (ref 22–32)
CREATININE: 1.74 mg/dL — AB (ref 0.61–1.24)
Calcium: 9.2 mg/dL (ref 8.9–10.3)
Chloride: 100 mmol/L — ABNORMAL LOW (ref 101–111)
GFR calc Af Amer: 50 mL/min — ABNORMAL LOW (ref >60)
GFR calc non Af Amer: 43 mL/min — ABNORMAL LOW (ref >60)
Glucose, Bld: 112 mg/dL — ABNORMAL HIGH (ref 65–99)
POTASSIUM: 3.7 mmol/L (ref 3.5–5.1)
SODIUM: 134 mmol/L — AB (ref 135–145)

## 2017-10-13 MED ORDER — LISINOPRIL 10 MG PO TABS
10.0000 mg | ORAL_TABLET | Freq: Two times a day (BID) | ORAL | Status: DC
  Administered 2017-10-13: 10 mg via ORAL
  Filled 2017-10-13: qty 1

## 2017-10-13 NOTE — Progress Notes (Signed)
Uneventful night. Reports cough is some better. Coughing up clear/white phlegm. Willie Herrera, Willie Herrera

## 2017-10-13 NOTE — Progress Notes (Signed)
Occupational Therapy Session Note  Patient Details  Name: Willie Herrera MRN: [id number] Date of Birth: 08/08/1960  Today's Date: 10/13/2017 OT Individual Time: 1100-1200 OT Individual Time Calculation (min): 60 min    Short Term Goals: Week 2:  OT Short Term Goal 1 (Week 2): STG=LTG due to LOS  Skilled Therapeutic Interventions/Progress Updates:     Pt presented supine in bed, he participated in skilled therapy session in rehab gym, whole therapy session pt ambulated w/o AD. Pt was educated on UE theraband HEP, He was given green level 3 theraband and was able to perform shoulder flexion/abduction, horizontal abduction, elbow flexion, diagonal up, and diagonal down, 10 reps each seated EOM. Pt was educated and given visual demonstration of Theraband exercise. 10 reps each, with Min VC's for technique and form of movement as well as taking rest breaks with breathing techniques for energy conservation, He was then able to demonstrate proper technique back. He was given the theraband UE HEP and green theraband to keep. Pt then participated in standing fine motor bolt box activity to address grip and pinch in BUE as well as problem solving techniques, and standing tolerance. He stood EOM for approximately 20 min requirning 2 rest breaks. He was able to screw/unscrew all bolts holes in the box, Min visual cues for correct nuts to fit with bolts.  Pt demonstrated simulated laundry activity, by loading, turning on washing machine with towels, and transferring the towels to the dryer and turning it on. Pt c/o slight pain at the end of the session in L shoulder, repositioning and rest was provided for comfort. He was left in bed with alarm set, call bell within reach.    Therapy Documentation Precautions:  Precautions Precautions: Fall Precaution Comments: mild L inattention; L hemi; maintain systolic BP < 220 Restrictions Weight Bearing Restrictions: No  See Function Navigator for Current Functional  Status.   Therapy/Group: Individual Therapy  Baruch GoutyMary Malyna Budney 10/13/2017, 2:56 PM

## 2017-10-13 NOTE — Progress Notes (Signed)
Subjective/Complaints: Cough feels better still bringing up some white phlegm.  No sweats or chills Left shoulder pain with coughing but no pain with left shoulder range of motion  ROS: Denies CP, SOB, nausea, vomiting, diarrhea.  Objective: Vital Signs: Blood pressure (!) 145/87, pulse (!) 102, temperature (!) 97.4 F (36.3 C), temperature source Oral, resp. rate 18, height 5\' 4"  (1.626 m), weight 85 kg (187 lb 6.3 oz), SpO2 100 %. No results found. No results found for this or any previous visit (from the past 72 hour(s)).   HEENT: Normocephalic. Atraumatic. Cardio: RRR. No JVD   Resp: CTA Bilaterally. Normal effort   GI: BS +, non-distended  Skin:   Intact. Warm and dry. Neuro: Alert/Oriented,  Motor: 4/5 L deltoid, bicep, tricep, grip, 4/5 in left hip flexor knee extensor ankle dorsiflexor Musc/Skel:  No tenderness. No edema. General no acute distress. Vital signs reviewed.   Assessment/Plan: 1. 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 Physiatrist is providing close team supervision and 24 hour management of active medical problems listed below. Physiatrist and rehab team continue to assess barriers to discharge/monitor patient progress toward functional and medical goals. FIM: Function - Bathing Position: Shower Body parts bathed by patient: Right arm, Left arm, Chest, Abdomen, Front perineal area, Buttocks, Right upper leg, Left upper leg, Right lower leg, Left lower leg, Back Body parts bathed by helper: Back Assist Level: Supervision or verbal cues  Function- Upper Body Dressing/Undressing What is the patient wearing?: Pull over shirt/dress Pull over shirt/dress - Perfomed by patient: Thread/unthread right sleeve, Thread/unthread left sleeve, Put head through opening, Pull shirt over trunk Assist  Level: Supervision or verbal cues Function - Lower Body Dressing/Undressing What is the patient wearing?: Underwear, Pants, Socks, Shoes Position: Sitting EOB Underwear - Performed by patient: Thread/unthread right underwear leg, Thread/unthread left underwear leg, Pull underwear up/down Pants- Performed by patient: Thread/unthread right pants leg, Thread/unthread left pants leg, Pull pants up/down Non-skid slipper socks- Performed by patient: Don/doff right sock, Don/doff left sock Non-skid slipper socks- Performed by helper: Don/doff right sock, Don/doff left sock Socks - Performed by patient: Don/doff right sock, Don/doff left sock Shoes - Performed by patient: Don/doff right shoe, Don/doff left shoe, Fasten right, Fasten left Shoes - Performed by helper: Don/doff right shoe, Don/doff left shoe Assist for footwear: Supervision/touching assist Assist for lower body dressing: Supervision or verbal cues  Function - Toileting Toileting steps completed by patient: Adjust clothing prior to toileting, Performs perineal hygiene, Adjust clothing after toileting Toileting Assistive Devices: Grab bar or rail Assist level: Touching or steadying assistance (Pt.75%)  Function - ArchivistToilet Transfers Toilet transfer assistive device: Grab bar Assist level to toilet: Supervision or verbal cues Assist level from toilet: Supervision or verbal cues Assist level to bedside commode (at bedside): Supervision or verbal cues  Function - Chair/bed transfer Chair/bed transfer method: Ambulatory Chair/bed transfer assist level: Supervision or verbal cues Chair/bed transfer assistive device: Armrests, Walker Chair/bed transfer details: Verbal cues for precautions/safety, Verbal cues for technique  Function - Locomotion: Wheelchair Type: Manual Max wheelchair distance: 50' Assist Level: Moderate assistance (Pt 50 - 74%) Assist Level: Moderate assistance (Pt 50 - 74%) Wheel 150 feet activity did not occur:  Safety/medical concerns Turns around,maneuvers to table,bed, and toilet,negotiates 3% grade,maneuvers on rugs and over doorsills: No Function - Locomotion: Ambulation Assistive device: No device Max distance: 25400ft  Assist level: Supervision or verbal cues Assist level: Supervision or verbal cues Walk 50 feet with 2 turns activity did not occur: Safety/medical concerns Assist level: Supervision or verbal cues Walk 150 feet activity did not occur: Safety/medical concerns Assist level: Supervision or verbal cues Walk 10 feet on uneven surfaces activity did not occur: Safety/medical concerns  Function - Comprehension Comprehension: Auditory Comprehension assist level: Understands basic 90% of the time/cues < 10% of the time  Function - Expression Expression: Verbal Expression assist level: Expresses basic needs/ideas: With no assist  Function - Social Interaction Social Interaction assist level: Interacts appropriately with others - No medications needed.  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 DVT Prophylaxis/Anticoagulation: Subcutaneous Lovenox.   3. Pain Management/headachesHydrocodone as needed   -minimal use of hydrocodone, will discontinue 4. Mood:Provide emotional support 5. Neuropsych: This patientiscapable of making decisions on hisown behalf. 6. Skin/Wound Care:Routineskin checks 7. Fluids/Electrolytes/Nutrition:Routine I&O's with follow-up chemistries 8.Hypertension.    increased lisinopril 10 mg twice daily  3/11   -increased hctz to 25mg      Vitals:   10/12/17 2103 10/13/17 0337  BP: 139/83 (!) 145/87  Pulse: 88 (!) 102  Resp:  18  Temp:  (!) 97.4 F (36.3 C)  SpO2:  100%     9.Tobacco abuse. NicoDerm patch. Provide counseling 10.Hyperlipidemia. Lipitor 11.  Insomnia improved on trazodone 50 mg QHS (scheduled) 12. AKI on CKD   Creatinine 1.45 on 3/7, recheck today   Need to adjust medications   Encourage fluids 13. Prediabetes   Continue to monitor 13. Cough   Productive of clear sputum, doubt ACE intolerance  LOS (Days) 11 A FACE TO FACE EVALUATION WAS PERFORMED  Erick Colace 10/13/2017, 8:38 AM

## 2017-10-13 NOTE — Progress Notes (Signed)
Physical Therapy Session Note  Patient Details  Name: Willie Herrera MRN: [id number] Date of Birth: 07/13/1961  Today's Date: 10/13/2017 PT Individual Time: 0930-1030 PT Individual Time Calculation (min): 60 min   Short Term Goals: Week 2:  PT Short Term Goal 1 (Week 2): STG= LTG due to ELOS  Skilled Therapeutic Interventions/Progress Updates:    Extra time to don shoes to prepare for session. Functional gait on unit with close supervision to occasional steadying assist and cues for L foot clearance and general safety due to high fall risk. Administered Berg Balance Test for fall risk assessment (see below). Fall recovery education and practiced floor transfer with review of what to do in case of fall and pt able to demonstrate with supervision (floor transfer). Stair negotiation for home entry and functional balance with close supervision and self selected reciprocal gait pattern despite cues for safer gait pattern to occasional L foot not fully clearing step but pt continues to choose reciprocal pattern. Cues for awareness of LUE on rail also as pt keeping it behind him at times. Neuro re-ed for balance re-training during dynamic gait activity while holding tray with stacked cups and progressed to going through obstacle course while holding this with BUEs with close supervision and cues for L heel strike and able to maintain positioning of cups (except 1 fell off throughout entire activity). NMR on Biodex for limits of stability activity on static -> compliant surface (level 8) (middle skill level) progressing from BUE to no UE support. Pt demonstrates increased difficulty with anterior and L weightshifting without UE support (on easy skill level) and decreased coordination of movement noted. Returned back to room and positioned in bed to rest before next therapy session with all needs in reach.  Medical interpreter present during session.   Therapy Documentation Precautions:  Precautions Precautions:  Fall Precaution Comments: mild L inattention; L hemi; maintain systolic BP < 220 Restrictions Weight Bearing Restrictions: No Pain: Pain Assessment Pain Assessment: 0-10 Pain Score: 0-No pain    Balance: Standardized Balance Assessment Standardized Balance Assessment: Berg Balance Test Berg Balance Test Sit to Stand: Able to stand without using hands and stabilize independently Standing Unsupported: Able to stand safely 2 minutes Sitting with Back Unsupported but Feet Supported on Floor or Stool: Able to sit safely and securely 2 minutes Stand to Sit: Sits safely with minimal use of hands Transfers: Able to transfer safely, minor use of hands Standing Unsupported with Eyes Closed: Able to stand 10 seconds safely Standing Ubsupported with Feet Together: Needs help to attain position but able to stand for 30 seconds with feet together From Standing, Reach Forward with Outstretched Arm: Can reach forward >12 cm safely (5") From Standing Position, Pick up Object from Floor: Able to pick up shoe, needs supervision From Standing Position, Turn to Look Behind Over each Shoulder: Looks behind one side only/other side shows less weight shift Turn 360 Degrees: Able to turn 360 degrees safely but slowly Standing Unsupported, Alternately Place Feet on Step/Stool: Able to complete >2 steps/needs minimal assist(steady assist; able to do all 8) Standing Unsupported, One Foot in Front: Loses balance while stepping or standing Standing on One Leg: Unable to try or needs assist to prevent fall Total Score: 37  See Function Navigator for Current Functional Status.   Therapy/Group: Individual Therapy  Karolee StampsGray, [place] Darrol PokeBrescia  Willie Herrera, PT, DPT  10/13/2017, 11:07 AM

## 2017-10-13 NOTE — Plan of Care (Signed)
  Progressing Consults RH STROKE PATIENT EDUCATION Description See Patient Education module for education specifics  10/13/2017 1456 - Progressing by Ronnette JuniperPerkins, Chariti Havel E, LPN RH SAFETY RH STG ADHERE TO SAFETY PRECAUTIONS W/ASSISTANCE/DEVICE Description STG Adhere to Safety Precautions With Mod I Assistance/Device.  10/13/2017 1456 - Progressing by Ronnette JuniperPerkins, Tabathia Knoche E, LPN RH COGNITION-NURSING RH STG ANTICIPATES NEEDS/CALLS FOR ASSIST W/ASSIST/CUES Description STG Anticipates Needs/Calls for Assist With Mod I Assistance/Cues.  10/13/2017 1456 - Progressing by Ronnette JuniperPerkins, Joseandres Mazer E, LPN RH KNOWLEDGE DEFICIT RH STG INCREASE KNOWLEDGE OF HYPERTENSION Description Pt will be able to verbalize 2 ways to manage blood pressure at the time of discharge with min cues.   10/13/2017 1456 - Progressing by Ronnette JuniperPerkins, Raistlin Gum E, LPN Spiritual Needs Ability to function at adequate level 10/13/2017 1456 - Progressing by Ronnette JuniperPerkins, Syler Norcia E, LPN

## 2017-10-13 NOTE — Progress Notes (Signed)
Physical Therapy Session Note  Patient Details  Name: Camilo Mander MRN: [id number] Date of Birth: [birth year]  Today's Date: 10/13/2017 PT Individual Time: 2103-1281 PT Individual Time Calculation (min): 72 min    Skilled Therapeutic Interventions/Progress Updates: Pt presented in bed requesting to use bathroom and agreeable to therapy. Performed bed mobility supervision from bed flat and ambulated to bathroom (+void). Pt ambulated to rehab gym no AD close supervision and participated in standing coordination activities including touching cones, standing on Airex with min challenges, and use of agility ladder. Pt noted to have improved L foot clearance with min verbal cues. Pt instructed in [place] and performed with supervision, handout provided. Pt ambulated to Day room and performed NuStep L3 x5 min for endurance and reciprocal activity. Pt ambulated back to room and returned to bed with bed alarm on and needs met.      Therapy Documentation Precautions:  Precautions Precautions: Fall Precaution Comments: mild L inattention; L hemi; maintain systolic BP < 188 Restrictions Weight Bearing Restrictions: No General:   Vital Signs: Therapy Vitals Temp: 98.8 F (37.1 C) Temp Source: Oral Pulse Rate: 78 Resp: 16 BP: 94/72 Patient Position (if appropriate): Lying Oxygen Therapy SpO2: 99 % O2 Device: Room Air Pain: Pain Assessment Pain Assessment: 0-10 Pain Score: 0-No pain   See Function Navigator for Current Functional Status.   Therapy/Group: Individual Therapy  Amair Shrout  Mc Hollen, PTA  10/13/2017, 3:48 PM

## 2017-10-14 ENCOUNTER — Inpatient Hospital Stay (HOSPITAL_COMMUNITY): Payer: Self-pay | Admitting: Occupational Therapy

## 2017-10-14 ENCOUNTER — Inpatient Hospital Stay (HOSPITAL_COMMUNITY): Payer: Self-pay | Admitting: Physical Therapy

## 2017-10-14 MED ORDER — SODIUM CHLORIDE 0.9 % IV BOLUS (SEPSIS)
500.0000 mL | Freq: Once | INTRAVENOUS | Status: AC
  Administered 2017-10-14: 500 mL via INTRAVENOUS

## 2017-10-14 MED ORDER — LISINOPRIL 10 MG PO TABS
10.0000 mg | ORAL_TABLET | Freq: Every day | ORAL | Status: DC
  Administered 2017-10-14 – 2017-10-15 (×2): 10 mg via ORAL
  Filled 2017-10-14 (×2): qty 1

## 2017-10-14 NOTE — Progress Notes (Signed)
Occupational Therapy Discharge Summary  Patient Details  Name: Willie Herrera MRN: [id number] Date of Birth: 01-Feb-1961     Patient has met 58 of 19 long term goals due to improved activity tolerance, improved balance, postural control, ability to compensate for deficits, functional use of  LEFT upper extremity, improved attention, improved awareness and improved coordination.  Patient to discharge at overall Supervision level.  Patient's care partner is independent to provide the necessary cognitive assistance at discharge.  Pt was agreeable and progressed through therapy. Pt has regained use of LUE WFL. The pt and wife were educated on safety precautions due to inattention to L UE and energy conservation during ADL activates, Pt requires supervision for all ADL's. He was educated on and able to demonstrtate UE Theraband HEP handout. He will go home with [facility] for toileting and showering.   Recommendation:  Patient will benefit from ongoing skilled OT services in outpatient setting to continue to advance functional skills in the area of BADL and iADL.  Equipment: BSC  Reasons for discharge: treatment goals met and discharge from hospital  Patient/family agrees with progress made and goals achieved: Yes  OT Discharge Precautions/Restrictions  Precautions Precautions: Fall Precaution Comments: Mild L inattention Restrictions Weight Bearing Restrictions: No ADL ADL ADL Comments: see functional navigator Vision Baseline Vision/History: No visual deficits Patient Visual Report: No change from baseline Vision Assessment?: No apparent visual deficits Perception  Perception: Within Functional Limits Inattention/Neglect: Appears intact;Other (comment)(Pt with L inattention/neglect on admission, has learned skills to compenate for deficits during functional tasks. ) Praxis Praxis: Intact Cognition Overall Cognitive Status: Within Functional Limits for tasks assessed Arousal/Alertness:  Awake/alert Orientation Level: Oriented X4 Memory: Appears intact Awareness: Impaired Awareness Impairment: Anticipatory impairment Problem Solving: Appears intact Behaviors: Impulsive Safety/Judgment: Impaired Comments: Decreased safety awareness and decreased awareness of deficits Sensation Sensation Light Touch: Appears Intact Proprioception: Appears Intact Coordination Gross Motor Movements are Fluid and Coordinated: Yes Fine Motor Movements are Fluid and Coordinated: Yes 9 Hole Peg Test: R: 30.07, 26.06, and 24.99    L: 45.68, 39.78, and 41.28 Motor  Motor Motor: Abnormal postural alignment and control Trunk/Postural Assessment  Cervical Assessment Cervical Assessment: Within Functional Limits Thoracic Assessment Thoracic Assessment: Within Functional Limits Lumbar Assessment Lumbar Assessment: Within Functional Limits Postural Control Postural Control: Deficits on evaluation Righting Reactions: Slightly delayed   Balance Balance Balance Assessed: Yes Static Sitting Balance Static Sitting - Level of Assistance: 6: Modified independent (Device/Increase time) Dynamic Sitting Balance Dynamic Sitting - Balance Support: During functional activity;No upper extremity supported Dynamic Sitting - Level of Assistance: 6: Modified independent (Device/Increase time);5: Stand by assistance Static Standing Balance Static Standing - Balance Support: During functional activity;No upper extremity supported Static Standing - Level of Assistance: 6: Modified independent (Device/Increase time) Dynamic Standing Balance Dynamic Standing - Balance Support: During functional activity;No upper extremity supported Dynamic Standing - Level of Assistance: 5: Stand by assistance Dynamic Standing - Comments: During LB dressing/ toileting tasks Extremity/Trunk Assessment RUE Assessment RUE Assessment: Within Functional Limits LUE Assessment LUE Assessment: Within Functional Limits(4/5  strength; WFL for tasks assessed) LUE Tone LUE Tone: Within Functional Limits   See Function Navigator for Current Functional Status.  Vilinda Blanks 10/14/2017, 3:27 PM

## 2017-10-14 NOTE — Progress Notes (Signed)
Social Work  Discharge Note  The overall goal for the admission was met for:   Discharge location: Yes-HOME WITH FAMILY WHO IS AWARE OF 24 HR SUPERVISION RECOMMENDATION  Length of Stay: Yes-13 DAYS  Discharge activity level: Yes-SUPERVISION-MOD/I LEVEL  Home/community participation: Yes  Services provided included: MD, RD, PT, OT, SLP, RN, CM, Pharmacy and SW  Financial Services: Other: PENDING MEDICAID  Follow-up services arranged: Home Health: [facility]-PT,OT,SW, DME: [facility] and Patient/Family has no preference for HH/DME agencies  Comments (or additional information):FAILY [place] ATTENDED THERAPIES WITH PT AND SAW PROGRESS AND HIS CURRENT LEVEL. AWARE FOR SAFETY 24 HR SUPERVISION RECOMMENDED AND WILL TRY TO DO THIS, BUT AT TIMES WILL BE ALONE. MATCH PROGRAM GIVEN TO PT FOR ASSISTANCE WITH MEDICATIONS AND SET UP WITH [facility] [place] FOR PCP APPOINTMENT 3/26 @ 2:30 PM  Patient/Family verbalized understanding of follow-up arrangements: Yes  Individual responsible for coordination of the follow-up plan: WIFE AND SON  Confirmed correct DME delivered: Elease Hashimoto 10/14/2017    Erum Cercone, Gard[place] Rhyme

## 2017-10-14 NOTE — Discharge Summary (Signed)
NAMRolene Course:  Herrera, Willie Herrera                    ACCOUNT NO.:  [id number]  MEDICAL RECORD NO.:  [id number]  LOCATION:                                 FACILITY:  PHYSICIAN:  Willie ColaceAndrew E. Herrera, M.D.DATE OF BIRTH:  1961-07-30  DATE OF ADMISSION:  10/03/2017 DATE OF DISCHARGE:  10/15/2017                              DISCHARGE SUMMARY   DISCHARGE DIAGNOSES: 1. Right posterior limb internal capsule infarction. 2. Subcutaneous Lovenox for deep vein thrombosis prophylaxis. 3. Hypertension. 4. Tobacco abuse. 5. Hyperlipidemia. 6. Insomnia. 7. Chronic kidney disease.  HISTORY:  This is a [age] year old right-handed, limited English-speaking, male from [place], with history of hypertension, tobacco abuse, on no prescription medications and limited medical followup, who lives with spouse, independent prior to admission.  Presented on 10/01/2017 with left-sided weakness, fall, slurred speech, hypertensive 200/120.  EKG; sinus rhythm, diffuse ST abnormalities, no prior comparison, troponin negative.  Cranial CT scan showed subtle hypoattenuation involving the right caudate head and anterior right insular cortex concerning for acute subacute ischemia.  CT of head and neck negative for large vessel occlusion.  Urine drug screen negative.  The patient did not receive tPA.  MRI, MRA showed small early subacute infarction, right posterior limb of internal capsule.  No hemorrhage or mass effect.  Maintained on aspirin for CVA prophylaxis.  Subcutaneous Lovenox for DVT prophylaxis. Echocardiogram with left ventricular hypertrophy.  No valvular disease. The patient was admitted for a comprehensive rehab program.  PAST MEDICAL HISTORY:  See discharge diagnoses.  SOCIAL HISTORY:  Lives with family.  Independent prior to admission. Functional status upon admission to Rehab Services was moderate assist, 50 feet, one-person handheld assistance, minimal guard sit-to-stand, min- to-mod assist with activities of daily  living.  PHYSICAL EXAMINATION:  VITAL SIGNS:  Blood pressure 164/99, pulse 76, temperature 98, and respirations 20. GENERAL:  This was an alert male, in no acute distress. HEENT:  Mild left facial droop.  EOMs intact. NECK:  Supple.  Nontender.  No JVD. CARDIAC:  Rate controlled. ABDOMEN:  Soft, nontender.  Good bowel sounds. LUNGS:  Clear to auscultation without wheeze. NEUROLOGIC:  Motor strength 5/5 right deltoid, biceps, triceps, hip flexors, knee extensors; 3+/5 left deltoid, biceps, triceps; 4- left hip flexors and knee extensors.  REHABILITATION HOSPITAL COURSE:  The patient was admitted to Inpatient Rehab Services.  Therapies initiated on a 3-hour daily basis, consisting of physical therapy, occupational therapy, and rehabilitation nursing. The following issues were addressed during the patient's rehabilitation stay.  Pertaining to Willie Herrera's right posterior limb infarction, remained stable, maintained on aspirin therapy.  He would follow up with Neurology Services.  Subcutaneous Lovenox for DVT prophylaxis.  No bleeding episodes.  Blood pressures controlled.  His lisinopril and hydrochlorothiazide were ongoing.  He would follow up with primary MD. He had a history of tobacco abuse.  Maintained on a NicoDerm patch.  The patient and family received full counseling in regard to cessation of nicotine products.  He had some insomnia, did well with scheduling trazodone.  Some mild elevations in creatinine, question baseline. Latest creatinine of 1.74 from admission of 1.45.  Again, he would follow up with his primary MD.  The patient received weekly collaborative interdisciplinary team conferences to discuss estimated length of stay, family teaching, any barriers to his discharge. Functional ambulation on the unit with close supervision to occasional steadying assist, working with fall recovery, cues for awareness of left upper extremity.  Demonstrated some increased difficulty  with anterior and left weight shifting, activities of daily living and homemaking, ambulates throughout his sessions without assistive device and overall supervision, occasional guarding, able to gather his belongings.  He would ambulate to the therapy gym.  Full family teaching was completed and plan discharge to home.  DISCHARGE MEDICATIONS: 1. Aspirin 325 mg p.o. daily. 2. Lipitor 40 mg p.o. daily. 3. Hydrochlorothiazide 25 mg p.o. daily. 4. Lisinopril 10 mg p.o. b.i.d. 5. Mucinex 600 mg p.o. b.i.d. as needed. 6. Nicoderm patch taper as directed. 7. Desyrel 50 mg p.o. at bedtime. 8. Tylenol as needed.  DIET:  His diet was regular.  FOLLOWUP:  He would follow up with, 1. [facility]. 2. Dr. Roda Herrera, Neurology Services, call for appointment. 3. Willie Richters, PA, medical management.  SPECIAL INSTRUCTIONS:  No driving.  No smoking.     Willie Herrera, P.A.   ______________________________ Willie Herrera, M.D.    DA/MEDQ  D:  10/14/2017  T:  10/14/2017  Job:  [id number]  cc:   Willie Herrera, M.D. Willie Herrera, [place] Dr. Roda Herrera

## 2017-10-14 NOTE — Progress Notes (Signed)
Physical Therapy Session Note  Patient Details  Name: Willie Herrera MRN: [id number] Date of Birth: 1960-10-15  Today's Date: 10/14/2017 PT Individual Time: 1505-6979 and 4801-6553 PT Individual Time Calculation (min): 55 min  And 72 min  Short Term Goals: Week 2:  PT Short Term Goal 1 (Week 2): STG= LTG due to ELOS  Skilled Therapeutic Interventions/Progress Updates: Pt presented in bed agreeable to therapy. Pt donned shoes mod I with increased time. Ambulated throughout unit to participate in functional activities including car transfer, stairs, ambulation on compliant surface and bed mobility on standard bed all performed at supervision or mod I level (see function tab). Pt participated in standing balance on red wedge and performing pipe tree with pt requiring min cues for appropriate sizing of pipes. Noted decreased L ankle control with fatigue with ankle in inversion and pt with difficulty correcting without verbal cues. Pt participated in basketball toss with single LE on 4in step, pt noted to have improving ankle strategy with time. Pt ambulated back to room at end of session and returned to bed. Pt left with bed alarm on and needs met.   Tx2: Pt presented in bed agreeable to therapy. Pt performed bed mobility mod I and ambulated to rehab gym. Pt participated in balance activities including ball bounce against rebounder on level tile and on Airex. Performed higher balance/gait activities including tandem walk, weaving around cones with 18in spaces, zoom ball on standing for increased use of LUE. Pt ambulated to day room and participated in [place] random control and catch game at L11 instability. Pt noted to favor L lower quadrant however was able to improve to more equal balance with practice and verbal cues. Performed NuStep L3 x 10 min for endurance with pt maintaining average 60 steps per min. Pt returned to room at end of session and returned to bed with call bell within reach and current needs  met.      Therapy Documentation Precautions:  Precautions Precautions: Fall Precaution Comments: Mild L inattention Restrictions Weight Bearing Restrictions: No General:   Vital Signs: Therapy Vitals Temp: 98.2 F (36.8 C) Temp Source: Oral Pulse Rate: 79 Resp: 16 BP: 105/64 Patient Position (if appropriate): Lying Oxygen Therapy SpO2: 98 % O2 Device: Room Air Pain:   Mobility:   Locomotion :    Trunk/Postural Assessment : Cervical Assessment Cervical Assessment: Within Functional Limits Thoracic Assessment Thoracic Assessment: Within Functional Limits Lumbar Assessment Lumbar Assessment: Within Functional Limits Postural Control Postural Control: Deficits on evaluation Righting Reactions: Slightly delayed   Balance: Balance Balance Assessed: Yes Static Sitting Balance Static Sitting - Level of Assistance: 6: Modified independent (Device/Increase time) Dynamic Sitting Balance Dynamic Sitting - Balance Support: During functional activity;No upper extremity supported Dynamic Sitting - Level of Assistance: 6: Modified independent (Device/Increase time);5: Stand by assistance Static Standing Balance Static Standing - Balance Support: During functional activity;No upper extremity supported Static Standing - Level of Assistance: 6: Modified independent (Device/Increase time) Dynamic Standing Balance Dynamic Standing - Balance Support: During functional activity;No upper extremity supported Dynamic Standing - Level of Assistance: 5: Stand by assistance Dynamic Standing - Comments: During LB dressing/ toileting tasks Exercises:   Other Treatments:     See Function Navigator for Current Functional Status.   Therapy/Group: Individual Therapy  Dodger Sinning  Severino Paolo, PTA  10/14/2017, 4:24 PM

## 2017-10-14 NOTE — Progress Notes (Signed)
Physical Therapy Discharge Summary  Patient Details  Name: Willie Herrera MRN: [id number] Date of Birth: 1960/09/04  Today's Date: 10/14/2017      Patient has met 9 of 9 long term goals due to improved activity tolerance, improved balance, improved postural control, increased strength, increased range of motion and improved coordination.  Patient to discharge at an ambulatory level Supervision.   Patient's care partner is independent to provide the necessary physical assistance at discharge.  Reasons goals not met: all PT goals met.   Recommendation:  Patient will benefit from ongoing skilled PT services in home health setting to continue to advance safe functional mobility, address ongoing impairments in balance, strength, safety, coordination, and minimize fall risk.  Equipment: No equipment provided  Reasons for discharge: treatment goals met and discharge from hospital  Patient/family agrees with progress made and goals achieved: Yes  PT Discharge Precautions/Restrictions Restrictions Weight Bearing Restrictions: No Vital Signs Therapy Vitals Pulse Rate: 74 BP: 113/72 Patient Position (if appropriate): Lying Pain   Vision/Perception     Cognition   Sensation   Motor  Motor Motor: Abnormal postural alignment and control  Mobility Bed Mobility Bed Mobility: Sit to Supine Sit to Supine: 6: Modified independent (Device/Increase time) Transfers Sit to Stand: 6: Modified independent (Device/Increase time) Stand to Sit: 6: Modified independent (Device/Increase time) Locomotion  Ambulation Ambulation/Gait Assistance: 6: Modified independent (Device/Increase time) Ambulation Distance (Feet): 200 Feet Assistive device: None Stairs / Additional Locomotion Stairs: Yes Stairs Assistance: 5: Supervision Stair Management Technique: Two rails Number of Stairs: 12 Height of Stairs: 6  Trunk/Postural Assessment  Cervical Assessment Cervical Assessment: Within Functional  Limits Thoracic Assessment Thoracic Assessment: Within Functional Limits Lumbar Assessment Lumbar Assessment: Within Functional Limits Postural Control Postural Control: Deficits on evaluation  Balance Static Sitting Balance Static Sitting - Level of Assistance: 6: Modified independent (Device/Increase time) Dynamic Sitting Balance Dynamic Sitting - Level of Assistance: 5: Stand by assistance Static Standing Balance Static Standing - Level of Assistance: 6: Modified independent (Device/Increase time) Dynamic Standing Balance Dynamic Standing - Level of Assistance: 5: Stand by assistance Extremity Assessment        LLE Assessment LLE Assessment: Exceptions to [facility] LLE Strength LLE Overall Strength Comments: grossly 4/5, hip flexion 4-/5   See Function Navigator for Current Functional Status.  [name] 10/14/2017, 9:58 AM

## 2017-10-14 NOTE — Progress Notes (Signed)
Occupational Therapy Session Note  Patient Details  Name: Rolene CourseLwin Hereford MRN: [id number] Date of Birth: 11/16/1960  Today's Date: 10/14/2017 OT Individual Time: 1100-1200 OT Individual Time Calculation (min): 60 min    Short Term Goals: Week 2:  OT Short Term Goal 1 (Week 2): STG=LTG due to LOS  Skilled Therapeutic Interventions/Progress Updates:    Pt presented supine in bed, he had no c/o pain or dizziness. Pt agreed to shower, he ambulated throughout room without AD to gather all clothing and towels for shower/dressing session, he was able to clean up all linens and clothing and disribute them to the appropriate bins. Pt ambulated to day room for 9 hole peg assessment, pt score on R hand average 27.04, pt score on L hand average 42.24. He then participated in PNF (patterns 1 and 2) activity using cups to improve grip, rom, endurance, reach, crossing midline, weight shifting and engaging L UE and LE. Pt completed therapy session by demonstrating UE theraband HEP, pt was able to recall 4 out of the 6 exercises and needed VC's for last 2, during the end of exercises the IV cath became detached from the pt, nursing was notified and the pt was left EOB with the nursing staff.    Therapy Documentation Precautions:  Precautions Precautions: Fall Precaution Comments: mild L inattention; L hemi; maintain systolic BP < 220 Restrictions Weight Bearing Restrictions: No   Therapy/Group: Individual Therapy  Baruch GoutyMary Elonna Mcfarlane 10/14/2017, 3:10 PM

## 2017-10-14 NOTE — Discharge Instructions (Signed)
Inpatient Rehab Discharge Instructions  Dehaven Sine Discharge date and time: No discharge date for patient encounter.   Activities/Precautions/ Functional Status: Activity: activity as tolerated Diet: regular diet Wound Care: none needed Functional status:  ___ No restrictions     ___ Walk up steps independently ___ 24/7 supervision/assistance   ___ Walk up steps with assistance ___ Intermittent supervision/assistance  ___ Bathe/dress independently ___ Walk with walker     _x__ Bathe/dress with assistance ___ Walk Independently    ___ Shower independently ___ Walk with assistance    ___ Shower with assistance ___ No alcohol     ___ Return to work/school ________  Special Instructions: No driving smoking or alcohol   COMMUNITY REFERRALS UPON DISCHARGE:    Home Health:   PT & OT    Agency:[facility] Phone:[phone]   Date of last service:10/15/2017   Medical Equipment/Items Ordered:BEDSIDE COMMODE  Agency/Supplier:[facility]   [id number]   GENERAL COMMUNITY RESOURCES FOR PATIENT/FAMILY: Support Groups:[facility] SUPPORT GROUP  EVERY SECOND Thursday @ 3:00-4:00 PM ON THE REHAB UNIT QUESTIONS [place] [phone]  STROKE/TIA DISCHARGE INSTRUCTIONS SMOKING Cigarette smoking nearly doubles your risk of having a stroke & is the single most alterable risk factor  If you smoke or have smoked in the last 12 months, you are advised to quit smoking for your health.  Most of the excess cardiovascular risk related to smoking disappears within a year of stopping.  Ask you doctor about anti-smoking medications  [place] Quit Line: 1-800-QUIT NOW  Free Smoking Cessation Classes [phone]  CHOLESTEROL Know your levels; limit fat & cholesterol in your diet  Lipid Panel     Component Value Date/Time   CHOL 232 (H) 10/02/2017 0458   TRIG 105 10/02/2017 0458   HDL 69 10/02/2017 0458   CHOLHDL 3.4 10/02/2017 0458   VLDL 21 10/02/2017 0458   LDLCALC 142 (H) 10/02/2017 0458       Many patients benefit from treatment even if their cholesterol is at goal.  Goal: Total Cholesterol (CHOL) less than 160  Goal:  Triglycerides (TRIG) less than 150  Goal:  HDL greater than 40  Goal:  LDL (LDLCALC) less than 100   BLOOD PRESSURE American Stroke Association blood pressure target is less that 120/80 mm/Hg  Your discharge blood pressure is:  BP: (!) 151/98  Monitor your blood pressure  Limit your salt and alcohol intake  Many individuals will require more than one medication for high blood pressure  DIABETES (A1c is a blood sugar average for last 3 months) Goal HGBA1c is under 7% (HBGA1c is blood sugar average for last 3 months)  Diabetes: No known diagnosis of diabetes    Lab Results  Component Value Date   HGBA1C 6.2 (H) 10/02/2017     Your HGBA1c can be lowered with medications, healthy diet, and exercise.  Check your blood sugar as directed by your physician  Call your physician if you experience unexplained or low blood sugars.  PHYSICAL ACTIVITY/REHABILITATION Goal is 30 minutes at least 4 days per week  Activity: Increase activity slowly, Therapies: Physical Therapy: Home Health Return to work:   Activity decreases your risk of heart attack and stroke and makes your heart stronger.  It helps control your weight and blood pressure; helps you relax and can improve your mood.  Participate in a regular exercise program.  Talk with your doctor about the best form of exercise for you (dancing, walking, swimming, cycling).  DIET/WEIGHT Goal is to maintain a healthy  weight  Your discharge diet is: Fall precautions Diet Heart Room service appropriate? Yes; Fluid consistency: Thin  liquids Your height is:  Height: 5\' 4"  (162.6 cm) Your current weight is: Weight: 85 kg (187 lb 6.3 oz) Your Body Mass Index (BMI) is:  BMI (Calculated): 32.15  Following the type of diet specifically designed for you will help prevent another stroke.  Your goal weight  range is:    Your goal Body Mass Index (BMI) is 19-24.  Healthy food habits can help reduce 3 risk factors for stroke:  High cholesterol, hypertension, and excess weight.  RESOURCES Stroke/Support Group:  Call [phone]   STROKE EDUCATION PROVIDED/REVIEWED AND GIVEN TO PATIENT Stroke warning signs and symptoms How to activate emergency medical system (call 911). Medications prescribed at discharge. Need for follow-up after discharge. Personal risk factors for stroke. Pneumonia vaccine given:  Flu vaccine given:  My questions have been answered, the writing is legible, and I understand these instructions.  I will adhere to these goals & educational materials that have been provided to me after my discharge from the hospital.      My questions have been answered and I understand these instructions. I will adhere to these goals and the provided educational materials after my discharge from the hospital.  Patient/Caregiver Signature _______________________________ Date __________  Clinician Signature _______________________________________ Date __________  Please bring this form and your medication list with you to all your follow-up doctor's appointments.

## 2017-10-14 NOTE — Progress Notes (Signed)
Subjective/Complaints: Cough improving.  Discussed low fluid intake  ROS: Denies CP, SOB, nausea, vomiting, diarrhea.  Objective: Vital Signs: Blood pressure 95/65, pulse 81, temperature 98.6 F (37 C), temperature source Oral, resp. rate 18, height 5' 4"  (1.626 m), weight 85 kg (187 lb 6.3 oz), SpO2 100 %. No results found. Results for orders placed or performed during the hospital encounter of 10/02/17 (from the past 72 hour(s))  Basic metabolic panel     Status: Abnormal   Collection Time: 10/13/17 10:41 AM  Result Value Ref Range   Sodium 134 (L) 135 - 145 mmol/L   Potassium 3.7 3.5 - 5.1 mmol/L   Chloride 100 (L) 101 - 111 mmol/L   CO2 24 22 - 32 mmol/L   Glucose, Bld 112 (H) 65 - 99 mg/dL   BUN 22 (H) 6 - 20 mg/dL   Creatinine, Ser 1.74 (H) 0.61 - 1.24 mg/dL   Calcium 9.2 8.9 - 10.3 mg/dL   GFR calc non Af Amer 43 (L) >60 mL/min   GFR calc Af Amer 50 (L) >60 mL/min    Comment: (NOTE) The eGFR has been calculated using the CKD EPI equation. This calculation has not been validated in all clinical situations. eGFR's persistently <60 mL/min signify possible Chronic Kidney Disease.    Anion gap 10 5 - 15    Comment: Performed at [facility] [address], [place], [place] [address]     HEENT: Normocephalic. Atraumatic. Cardio: RRR. No JVD   Resp: CTA Bilaterally. Normal effort   GI: BS +, non-distended  Skin:   Intact. Warm and dry. Neuro: Alert/Oriented,  Motor: 4/5 L deltoid, bicep, tricep, grip, 4/5 in left hip flexor knee extensor ankle dorsiflexor Musc/Skel:  No tenderness. No edema. General no acute distress. Vital signs reviewed.   Assessment/Plan: 1. 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 Physiatrist is providing close team supervision and 24 hour management of active medical  problems listed below. Physiatrist and rehab team continue to assess barriers to discharge/monitor patient progress toward functional and medical goals. FIM: Function - Bathing Position: Shower Body parts bathed by patient: Right arm, Left arm, Chest, Abdomen, Front perineal area, Buttocks, Right upper leg, Left upper leg, Right lower leg, Left lower leg, Back Body parts bathed by helper: Back Assist Level: Supervision or verbal cues  Function- Upper Body Dressing/Undressing What is the patient wearing?: Pull over shirt/dress Pull over shirt/dress - Perfomed by patient: Thread/unthread right sleeve, Thread/unthread left sleeve, Put head through opening, Pull shirt over trunk Assist Level: Supervision or verbal cues Function - Lower Body Dressing/Undressing What is the patient wearing?: Underwear, Pants, Socks, Shoes Position: Sitting EOB Underwear - Performed by patient: Thread/unthread right underwear leg, Thread/unthread left underwear leg, Pull underwear up/down Pants- Performed by patient: Thread/unthread right pants leg, Thread/unthread left pants leg, Pull pants up/down Non-skid slipper socks- Performed by patient: Don/doff right sock, Don/doff left sock Non-skid slipper socks- Performed by helper: Don/doff right sock, Don/doff left sock Socks - Performed by patient: Don/doff right sock, Don/doff left sock Shoes - Performed by patient: Don/doff right shoe, Don/doff left shoe, Fasten right, Fasten left Shoes - Performed by helper: Don/doff right shoe, Don/doff left shoe Assist for footwear: Supervision/touching assist Assist for lower body dressing: Supervision or verbal cues  Function - Toileting Toileting steps completed by patient: Adjust clothing prior to toileting, Performs  perineal hygiene, Adjust clothing after toileting Toileting Assistive Devices: Grab bar or rail Assist level: Supervision or verbal cues  Function - Toilet Transfers Toilet transfer assistive device: Grab  bar Assist level to toilet: Supervision or verbal cues Assist level from toilet: Supervision or verbal cues Assist level to bedside commode (at bedside): Supervision or verbal cues  Function - Chair/bed transfer Chair/bed transfer method: Ambulatory Chair/bed transfer assist level: Supervision or verbal cues Chair/bed transfer assistive device: Armrests, Walker Chair/bed transfer details: Verbal cues for precautions/safety, Verbal cues for technique  Function - Locomotion: Wheelchair Type: Manual Max wheelchair distance: 50' Assist Level: Moderate assistance (Pt 50 - 74%) Assist Level: Moderate assistance (Pt 50 - 74%) Wheel 150 feet activity did not occur: Safety/medical concerns Turns around,maneuvers to table,bed, and toilet,negotiates 3% grade,maneuvers on rugs and over doorsills: No Function - Locomotion: Ambulation Assistive device: No device Max distance: 214f  Assist level: Supervision or verbal cues Assist level: Supervision or verbal cues Walk 50 feet with 2 turns activity did not occur: Safety/medical concerns Assist level: Touching or steadying assistance (Pt > 75%) Walk 150 feet activity did not occur: Safety/medical concerns Assist level: Supervision or verbal cues Walk 10 feet on uneven surfaces activity did not occur: Safety/medical concerns  Function - Comprehension Comprehension: Auditory Comprehension assist level: Understands basic 90% of the time/cues < 10% of the time  Function - Expression Expression: Verbal Expression assist level: Expresses basic needs/ideas: With no assist  Function - Social Interaction Social Interaction assist level: Interacts appropriately with others - No medications needed.  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 DVT Prophylaxis/Anticoagulation: Subcutaneous Lovenox.   3. Pain Management/headachesno recent c/os, tylenol for pain prn 4. Mood:Provide emotional support 5. Neuropsych: This patientiscapable of making decisions on hisown behalf. 6. Skin/Wound Care:Routineskin checks 7. Fluids/Electrolytes/Nutrition:Routine I&O's with follow-up chemistries 8.Hypertension.    increased lisinopril 10 mg twice daily  3/11, BPs running low in 90s   -increased hctz to 281mon 3/8    Vitals:   10/13/17 2110 10/14/17 0520  BP: 135/86 95/65  Pulse: 87 81  Resp:  18  Temp:  98.6 F (37 C)  SpO2: 100% 100%     9.Tobacco abuse. NicoDerm patch. Provide counseling 10.Hyperlipidemia. Lipitor 11.  Insomnia improved on trazodone 50 mg QHS (scheduled) 12. AKI on CKD   Creatinine 1.45 on 3/7, recheck 1.74, reduce ACE, give .9NS bolus, recheck in am   Need to adjust medications   Fluids have been ~50051mer day, up to 1000m21m11 oupt not recorded 13. Prediabetes   Continue to monitor 13. Cough   Productive of clear sputum, doubt ACE intolerance  LOS (Days) 12 A FACE TO FACE EVALUATION WAS PERFORMED  AndrCharlett Blake2/2019, 7:23 AM

## 2017-10-14 NOTE — Discharge Summary (Signed)
Discharge summary job [id number]

## 2017-10-15 DIAGNOSIS — I6300 Cerebral infarction due to thrombosis of unspecified precerebral artery: Secondary | ICD-10-CM

## 2017-10-15 LAB — BASIC METABOLIC PANEL
ANION GAP: 10 (ref 5–15)
BUN: 21 mg/dL — AB (ref 6–20)
CHLORIDE: 101 mmol/L (ref 101–111)
CO2: 23 mmol/L (ref 22–32)
Calcium: 9 mg/dL (ref 8.9–10.3)
Creatinine, Ser: 1.4 mg/dL — ABNORMAL HIGH (ref 0.61–1.24)
GFR calc Af Amer: >60 mL/min (ref >60)
GFR calc non Af Amer: 56 mL/min — ABNORMAL LOW (ref >60)
GLUCOSE: 101 mg/dL — AB (ref 65–99)
Potassium: 3.8 mmol/L (ref 3.5–5.1)
Sodium: 134 mmol/L — ABNORMAL LOW (ref 135–145)

## 2017-10-15 MED ORDER — NICOTINE 21 MG/24HR TD PT24: MEDICATED_PATCH | TRANSDERMAL | 0 refills | Status: AC

## 2017-10-15 MED ORDER — ATORVASTATIN CALCIUM 40 MG PO TABS: 40.0000 mg | ORAL_TABLET | Freq: Every day | ORAL | 0 refills | Status: DC

## 2017-10-15 MED ORDER — TRAZODONE HCL 50 MG PO TABS: 50.0000 mg | ORAL_TABLET | Freq: Every day | ORAL | 0 refills | Status: AC

## 2017-10-15 MED ORDER — HYDROCHLOROTHIAZIDE 25 MG PO TABS: 25.0000 mg | ORAL_TABLET | Freq: Every day | ORAL | 0 refills | Status: DC

## 2017-10-15 MED ORDER — GUAIFENESIN ER 600 MG PO TB12: 600.0000 mg | ORAL_TABLET | Freq: Two times a day (BID) | ORAL | 0 refills | Status: AC

## 2017-10-15 MED ORDER — LISINOPRIL 10 MG PO TABS: 10.0000 mg | ORAL_TABLET | Freq: Every day | ORAL | 0 refills | Status: DC

## 2017-10-15 NOTE — Progress Notes (Signed)
Subjective/Complaints: Occ cough that causes Left shoulder pain , no pain with deep breathing  ROS: Denies CP, SOB, nausea, vomiting, diarrhea.  Objective: Vital Signs: Blood pressure 138/87, pulse 82, temperature 98.2 F (36.8 C), temperature source Oral, resp. rate 18, height _0  (1.626 m), weight 85 kg (187 lb 6.3 oz), SpO2 99 %. No results found. Results for orders placed or performed during the hospital encounter of 10/02/17 (from the past 72 hour(s))  Basic metabolic panel     Status: Abnormal   Collection Time: 10/13/17 10:41 AM  Result Value Ref Range   Sodium 134 (L) 135 - 145 mmol/L   Potassium 3.7 3.5 - 5.1 mmol/L   Chloride 100 (L) 101 - 111 mmol/L   CO2 24 22 - 32 mmol/L   Glucose, Bld 112 (H) 65 - 99 mg/dL   BUN 22 (H) 6 - 20 mg/dL   Creatinine, Ser 1.74 (H) 0.61 - 1.24 mg/dL   Calcium 9.2 8.9 - 10.3 mg/dL   GFR calc non Af Amer 43 (L) >60 mL/min   GFR calc Af Amer 50 (L) >60 mL/min    Comment: (NOTE) The eGFR has been calculated using the CKD EPI equation. This calculation has not been validated in all clinical situations. eGFR's persistently <60 mL/min signify possible Chronic Kidney Disease.    Anion gap 10 5 - 15    Comment: Performed at [facility] [address], [place], [place] [address]     HEENT: Normocephalic. Atraumatic. Cardio: RRR. No JVD   Resp: CTA Bilaterally. Normal effort   GI: BS +, non-distended  Skin:   Intact. Warm and dry. Neuro: Alert/Oriented,  Motor: 4/5 L deltoid, bicep, tricep, grip, 4/5 in left hip flexor knee extensor ankle dorsiflexor Musc/Skel:  No tenderness. No edema. General no acute distress. Vital signs reviewed.   Assessment/Plan: 1. 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 Physiatrist is providing close team supervision and 24 hour  management of active medical problems listed below. Physiatrist and rehab team continue to assess barriers to discharge/monitor patient progress toward functional and medical goals. FIM: Function - Bathing Position: Shower Body parts bathed by patient: Right arm, Left arm, Chest, Abdomen, Front perineal area, Buttocks, Right upper leg, Left upper leg, Right lower leg, Left lower leg, Back Body parts bathed by helper: Back Assist Level: Supervision or verbal cues  Function- Upper Body Dressing/Undressing What is the patient wearing?: Pull over shirt/dress Pull over shirt/dress - Perfomed by patient: Thread/unthread right sleeve, Thread/unthread left sleeve, Put head through opening, Pull shirt over trunk Assist Level: More than reasonable time Function - Lower Body Dressing/Undressing What is the patient wearing?: Underwear, Pants, Socks, Shoes Position: (BSC in bathroom) Underwear - Performed by patient: Thread/unthread right underwear leg, Thread/unthread left underwear leg, Pull underwear up/down Pants- Performed by patient: Thread/unthread right pants leg, Thread/unthread left pants leg, Pull pants up/down Non-skid slipper socks- Performed by patient: Don/doff right sock, Don/doff left sock Non-skid slipper socks- Performed by helper: Don/doff right sock, Don/doff left sock Socks - Performed by patient: Don/doff right sock, Don/doff left sock Shoes - Performed by patient: Don/doff right shoe, Don/doff left shoe, Fasten right, Fasten left Shoes - Performed by helper: Don/doff right shoe, Don/doff left shoe Assist for footwear: Supervision/touching assist Assist for lower body dressing: Supervision or verbal cues  Function - Toileting Toileting steps completed by  patient: Adjust clothing prior to toileting, Performs perineal hygiene, Adjust clothing after toileting Toileting Assistive Devices: Grab bar or rail Assist level: Supervision or verbal cues  Function - Engineer, petroleum transfer assistive device: Grab bar Assist level to toilet: Supervision or verbal cues Assist level from toilet: Supervision or verbal cues Assist level to bedside commode (at bedside): Supervision or verbal cues  Function - Chair/bed transfer Chair/bed transfer method: Ambulatory Chair/bed transfer assist level: No Help, no cues, assistive device, takes more than a reasonable amount of time Chair/bed transfer assistive device: Armrests, Walker Chair/bed transfer details: Verbal cues for precautions/safety, Verbal cues for technique  Function - Locomotion: Wheelchair Type: Manual Max wheelchair distance: 50' Assist Level: Moderate assistance (Pt 50 - 74%) Assist Level: Moderate assistance (Pt 50 - 74%) Wheel 150 feet activity did not occur: Safety/medical concerns Turns around,maneuvers to table,bed, and toilet,negotiates 3% grade,maneuvers on rugs and over doorsills: No Function - Locomotion: Ambulation Assistive device: No device Max distance: 250f Assist level: No help, No cues, assistive device, takes more than a reasonable amount of time Assist level: No help, No cues, assistive device, takes more than a reasonable amount of time Walk 50 feet with 2 turns activity did not occur: Safety/medical concerns Assist level: No help, No cues, assistive device, takes more than a reasonable amount of time Walk 150 feet activity did not occur: Safety/medical concerns Assist level: No help, No cues, assistive device, takes more than a reasonable amount of time Walk 10 feet on uneven surfaces activity did not occur: Safety/medical concerns Assist level: Supervision or verbal cues  Function - Comprehension Comprehension: Auditory Comprehension assist level: Understands basic 90% of the time/cues < 10% of the time  Function - Expression Expression: Verbal Expression assist level: Expresses basic needs/ideas: With no assist  Function - Social Interaction Social  Interaction assist level: Interacts appropriately with others - No medications needed.  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 DVT Prophylaxis/Anticoagulation: Subcutaneous Lovenox.   3. Pain Management/headachesno recent c/os, tylenol for pain prn 4. Mood:Provide emotional support 5. Neuropsych: This patientiscapable of making decisions on hisown behalf. 6. Skin/Wound Care:Routineskin checks 7. Fluids/Electrolytes/Nutrition:Routine I&O's with follow-up chemistries 8.Hypertension.    increased lisinopril 10 mg twice daily  3/11, BPs running low in 90s, reduced to daily, improved   -increased hctz to 221mon 3/8    Vitals:   10/14/17 2058 10/15/17 0156  BP: 132/81 138/87  Pulse: 87 82  Resp:  18  Temp:  98.2 F (36.8 C)  SpO2: 99% 99%     9.Tobacco abuse. NicoDerm patch. Provide counseling 10.Hyperlipidemia. Lipitor 11.  Insomnia improved on trazodone 50 mg QHS (scheduled) 12. AKI on CKD   Creatinine 1.45 on 3/7, recheck 1.74, reduce ACE, give .9NS bolus, recheck in am   Need to adjust medications   Fluid intake low 36014mecorded 3/12 13. Prediabetes   Continue to monitor 13. Cough   Productive of clear sputum, doubt ACE intolerance  LOS (Days) 13 A FACE TO FACE EVALUATION WAS PERFORMED  Luanna Salk Dwon Sky 10/15/2017, 7:53 AM

## 2017-10-15 NOTE — Progress Notes (Signed)
Patient discharged to home, accompanied by his wife. 

## 2017-10-17 ENCOUNTER — Telehealth: Payer: Self-pay | Admitting: *Deleted

## 2017-10-17 NOTE — Telephone Encounter (Addendum)
First attempt to reach Mr Willie Herrera was unsuccessful. Message left to call office on generic VM.  There was no mention in hosp discharge summary or SW discharge note of language barrier, but demographics indicate pt only speaks Burmese. There is also a conflict in the appt with Dr Wynn BankerKirsteins 10/28/17 @1 :45 and an appt set by B Dupree MSW for [facility] @ [facility]:30 that will need to be changed.  Paper work for appt with Kirsteins was mailed previously.  We received a call back from Mr [name]'s son and TC call completed.    1. Are you/is patient experiencing any problems since coming home? Are there any questions regarding any aspect of care? NO 2. Are there any questions regarding medications administration/dosing? Are meds being taken as prescribed? Patient should review meds with caller to confirm HIS SON REPORTS THEY HAVE MEDICATIONS 3. Have there been any falls? NO 4. Has Home Health been to the house and/or have they contacted you? If not, have you tried to contact them? Can we help you contact them? YES THEY HAVE MADE CONTACT 5. Are bowels and bladder emptying properly? Are there any unexpected incontinence issues? If applicable, is patient following bowel/bladder programs? NO PROBLEMS 6. Any fevers, problems with breathing, unexpected pain? NO 7. Are there any skin problems or new areas of breakdown? NO 8. Has the patient/family member arranged specialty MD follow up (ie cardiology/neurology/renal/surgical/etc)?  Can we help arrange? APPT GIVEN TO SEE DR Wynn BankerKIRSTEINS (CHANGED TO 10/27/17 @12 :30  9. Does the patient need any other services or support that we can help arrange? NO 10. Are caregivers following through as expected in assisting the patient? YES 11. Has the patient quit smoking, drinking alcohol, or using drugs as recommended? YES  Appointment Monday 10/27/17 @12 :30 to see Dr Wynn BankerKirsteins and arrive by 12:00 Address reviewed [address]

## 2017-10-21 ENCOUNTER — Telehealth: Payer: Self-pay | Admitting: Physical Medicine & Rehabilitation

## 2017-10-21 NOTE — Telephone Encounter (Signed)
Tell pt to stop the HCTZ Cont the lisinopril

## 2017-10-21 NOTE — Telephone Encounter (Signed)
Thayer Ohmhris PT with [facility] needs to get verbal orders to see patient 2w2 and 1w1. Also, patient's blood pressure was 90/70 sitting and 78/61 standing.  Patient is feeling a little light headed.  They will monitor it, but patient doesn't have any appointments with anyone until next week.  Please call Thayer OhmChris at [phone].

## 2017-10-22 ENCOUNTER — Telehealth: Payer: Self-pay | Admitting: Physical Medicine & Rehabilitation

## 2017-10-22 NOTE — Telephone Encounter (Signed)
Ricazio OT with [facility] needs to get verbal orders to see patient 2w2 and 1 every other week for 2 weeks.  Can leave a message on phone it is a secured voicemail at [phone].

## 2017-10-22 NOTE — Telephone Encounter (Signed)
I notified son.

## 2017-10-23 ENCOUNTER — Telehealth: Payer: Self-pay | Admitting: *Deleted

## 2017-10-23 NOTE — Telephone Encounter (Signed)
Approval given

## 2017-10-23 NOTE — Telephone Encounter (Signed)
Received Physician Orders from [facility]; forwarded to provider/SLS 03/21  

## 2017-10-24 ENCOUNTER — Telehealth: Payer: Self-pay

## 2017-10-24 ENCOUNTER — Telehealth: Payer: Self-pay | Admitting: *Deleted

## 2017-10-24 NOTE — Telephone Encounter (Signed)
Joycelyn SchmidKim heimn Social Worker [facility] requesting verbal orders for a 1 time visit to assess patient.   Called her back and approved verbal orders.

## 2017-10-24 NOTE — Telephone Encounter (Addendum)
Eulis FosterKim Hyman, Social work, [facility] left a message asking for verbal orders for [facility].  The previous order lapsed, needs a new one.  I attempted to contact, left a generic phone message to return call as her voicemail did not properly identify who she was.

## 2017-10-27 ENCOUNTER — Ambulatory Visit (HOSPITAL_BASED_OUTPATIENT_CLINIC_OR_DEPARTMENT_OTHER): Payer: Self-pay | Admitting: Physical Medicine & Rehabilitation

## 2017-10-27 ENCOUNTER — Encounter: Payer: Self-pay | Admitting: Physical Medicine & Rehabilitation

## 2017-10-27 ENCOUNTER — Encounter: Payer: Self-pay | Attending: Physical Medicine & Rehabilitation

## 2017-10-27 VITALS — BP 137/96 | HR 80

## 2017-10-27 DIAGNOSIS — E785 Hyperlipidemia, unspecified: Secondary | ICD-10-CM | POA: Insufficient documentation

## 2017-10-27 DIAGNOSIS — Z7982 Long term (current) use of aspirin: Secondary | ICD-10-CM | POA: Insufficient documentation

## 2017-10-27 DIAGNOSIS — Z79899 Other long term (current) drug therapy: Secondary | ICD-10-CM | POA: Insufficient documentation

## 2017-10-27 DIAGNOSIS — G8194 Hemiplegia, unspecified affecting left nondominant side: Secondary | ICD-10-CM

## 2017-10-27 DIAGNOSIS — I63311 Cerebral infarction due to thrombosis of right middle cerebral artery: Secondary | ICD-10-CM | POA: Insufficient documentation

## 2017-10-27 DIAGNOSIS — R269 Unspecified abnormalities of gait and mobility: Secondary | ICD-10-CM

## 2017-10-27 DIAGNOSIS — Z87891 Personal history of nicotine dependence: Secondary | ICD-10-CM | POA: Insufficient documentation

## 2017-10-27 DIAGNOSIS — I10 Essential (primary) hypertension: Secondary | ICD-10-CM | POA: Insufficient documentation

## 2017-10-27 DIAGNOSIS — I69398 Other sequelae of cerebral infarction: Secondary | ICD-10-CM

## 2017-10-27 NOTE — Progress Notes (Signed)
Subjective:    Patient ID: Willie Herrera, male    DOB: 07-22-1961, 57 y.o.   MRN: [id number] Inpt rehab transitional care- phone call completed DATE OF ADMISSION:  10/03/2017 DATE OF DISCHARGE:  03/13/201 57 year old right-handed, limited English-speaking, male from [place], with history of hypertension, tobacco abuse, on no prescription medications and limited medical followup, who lives with spouse, independent prior to admission.  Presented on 10/01/2017 with left-sided weakness, fall, slurred speech, hypertensive 200/120.  EKG; sinus rhythm, diffuse ST abnormalities, no prior comparison, troponin negative.  Cranial CT scan showed subtle hypoattenuation involving the right caudate head and anterior right insular cortex concerning for acute subacute ischemia.  CT of head and neck negative for large vessel occlusion.  Urine drug screen negative.  The patient did not receive tPA.  MRI, MRA showed small early subacute infarction, right posterior limb of internal capsule.  No hemorrhage or mass effect.  Maintained on aspirin for CVA prophylaxis.  Subcutaneous Lovenox for DVT prophylaxis. Echocardiogram with left ventricular hypertrophy.  No valvular disease.   HPI Left hip pain after therapy Pt is able to dress and bathe himself Had some dizziness with "low blood pressure" last week reported 120/60.  HCTZ was stopped and Lisinopril was continued No falls or new issues  Plans to see therapy in am No PCP f/u is arranged No therapy thus far, family plans to get set up in [place] Pain Inventory Average Pain 5 Pain Right Now 4 My pain is intermittent, dull and aching  In the last 24 hours, has pain interfered with the following? General activity 0 Relation with others 0 Enjoyment of life 0 What TIME of day is your pain at its worst? night Sleep (in general) Fair  Pain is worse with: walking Pain improves with: rest and medication Relief from Meds: 4  Mobility walk without  assistance ability to climb steps?  no do you drive?  no  Function not employed: date last employed .  Neuro/Psych tremor  Prior Studies Any changes since last visit?  no  Physicians involved in your care Any changes since last visit?  no   Family History  Problem Relation Age of Onset  . Hypertension Mother   . Stroke Maternal Grandfather    Social History   Socioeconomic History  . Marital status: Married    Spouse name: Not on file  . Number of children: Not on file  . Years of education: Not on file  . Highest education level: Not on file  Occupational History  . Not on file  Social Needs  . Financial resource strain: Not on file  . Food insecurity:    Worry: Not on file    Inability: Not on file  . Transportation needs:    Medical: Not on file    Non-medical: Not on file  Tobacco Use  . Smoking status: Former Games developer  . Smokeless tobacco: Never Used  Substance and Sexual Activity  . Alcohol use: Not Currently  . Drug use: No  . Sexual activity: Not on file  Lifestyle  . Physical activity:    Days per week: Not on file    Minutes per session: Not on file  . Stress: Not on file  Relationships  . Social connections:    Talks on phone: Not on file    Gets together: Not on file    Attends religious service: Not on file    Active member of club or organization: Not on file  Attends meetings of clubs or organizations: Not on file    Relationship status: Not on file  Other Topics Concern  . Not on file  Social History Narrative  . Not on file   History reviewed. No pertinent surgical history. Past Medical History:  Diagnosis Date  . AKI (acute kidney injury) (HCC)   . Alcohol abuse   . HTN (hypertension) 08/25/2014  . Hyperlipidemia    BP (!) 137/96   Pulse 80   SpO2 95%   Opioid Risk Score:   Fall Risk Score:  `1  Depression screen PHQ 2/9  Depression screen PHQ 2/9 05/03/2014  Decreased Interest 0  Down, Depressed, Hopeless 0  PHQ - 2  Score 0     Review of Systems  Constitutional: Negative.   HENT: Positive for congestion and sneezing.   Eyes: Negative.   Respiratory: Positive for cough, shortness of breath and wheezing.   Cardiovascular: Negative.   Gastrointestinal: Negative.   Endocrine: Negative.   Genitourinary: Negative.   Musculoskeletal: Positive for arthralgias and myalgias.  Skin: Negative.   Allergic/Immunologic: Negative.   Neurological: Negative.   Hematological: Negative.   Psychiatric/Behavioral: Negative.   All other systems reviewed and are negative.      Objective:   Physical Exam  Constitutional: He is oriented to person, place, and time. He appears well-developed and well-nourished.  HENT:  Head: Normocephalic and atraumatic.  Eyes: Pupils are equal, round, and reactive to light. Conjunctivae and EOM are normal.  Neck: Normal range of motion.  Musculoskeletal: Normal range of motion.  Neurological: He is alert and oriented to person, place, and time.  Psychiatric: He has a normal mood and affect.  Nursing note and vitals reviewed. motor 4/5 in Left deltoid, biceps grip.  4/5 Left HF, 5/5 L knee ext and  ADF 5/5 RUE and RLE Sensation intact Gait without assistive device no toe drag or knee instability       Assessment & Plan:  1.  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

## 2017-10-28 ENCOUNTER — Encounter: Payer: Self-pay | Admitting: Physical Medicine & Rehabilitation

## 2017-10-30 NOTE — Telephone Encounter (Signed)
Called and left a message again.  Patient seen by Dr. Wynn BankerKirsteins on 10/27/2017

## 2017-11-19 NOTE — Progress Notes (Deleted)
[facility] [address] [place]. [place] [address] [phone]       OFFICE FOLLOW UP NOTE  Mr. Willie Herrera Date of Birth:  28-Sep-1960 Medical Record Number:  [id number]   Reason for Referral:  hospital stroke follow up  CHIEF COMPLAINT:  No chief complaint on file.   HPI: Willie Herrera is being seen today for initial visit in the office for right posterior limb internal capsule on 09/11/17. History obtained from patient and chart review. Reviewed all radiology images and labs personally.  Willie Herrera is a 57 year old male with history of hypertension, heavy smoking, heavy alcohol admitted for left-sided weakness, left facial droop and fall on 10/01/17.  CT initially concerning for right insular cortex infarct, however MRI showed right PLIC lacunar infarct with old left BG and right CR infarcts.  MRA unremarkable.  CTA showed bilateral siphon atherosclerosis but no significant stenosis.  2D echo showed normal EF.  LDL 142 and A1c 6.2.  UDS negative.  Son confirmed that patient is a  heavy smoker and drinks alcohol heavily. Denies medication usage prior to admission. Recommended to start aspirin 325mg  and lipitor for secondary stroke prevention. PT/OT recommend CIR once stable for discharge.   Since discharge, ***     ROS:   14 system review of systems performed and negative with exception of ***  PMH:  Past Medical History:  Diagnosis Date  . AKI (acute kidney injury) (HCC)   . Alcohol abuse   . HTN (hypertension) 08/25/2014  . Hyperlipidemia     PSH: No past surgical history on file.  Social History:  Social History   Socioeconomic History  . Marital status: Married    Spouse name: Not on file  . Number of children: Not on file  . Years of education: Not on file  . Highest education level: Not on file  Occupational History  . Not on file  Social Needs  . Financial resource strain: Not on file  . Food insecurity:    Worry: Not on file    Inability: Not on  file  . Transportation needs:    Medical: Not on file    Non-medical: Not on file  Tobacco Use  . Smoking status: Former Games developer  . Smokeless tobacco: Never Used  Substance and Sexual Activity  . Alcohol use: Not Currently  . Drug use: No  . Sexual activity: Not on file  Lifestyle  . Physical activity:    Days per week: Not on file    Minutes per session: Not on file  . Stress: Not on file  Relationships  . Social connections:    Talks on phone: Not on file    Gets together: Not on file    Attends religious service: Not on file    Active member of club or organization: Not on file    Attends meetings of clubs or organizations: Not on file    Relationship status: Not on file  . Intimate partner violence:    Fear of current or ex partner: Not on file    Emotionally abused: Not on file    Physically abused: Not on file    Forced sexual activity: Not on file  Other Topics Concern  . Not on file  Social History Narrative  . Not on file    Family History:  Family History  Problem Relation Age of Onset  . Hypertension Mother   . Stroke Maternal Grandfather     Medications:   Current Outpatient  Medications on File Prior to Visit  Medication Sig Dispense Refill  . aspirin 325 MG tablet Take 1 tablet (325 mg total) by mouth daily.    Marland Kitchen. atorvastatin (LIPITOR) 40 MG tablet Take 1 tablet (40 mg total) by mouth daily at 6 PM. 30 tablet 0  . guaiFENesin (MUCINEX) 600 MG 12 hr tablet Take 1 tablet (600 mg total) by mouth 2 (two) times daily. 60 tablet 0  . lisinopril (PRINIVIL,ZESTRIL) 10 MG tablet Take 1 tablet (10 mg total) by mouth daily. 30 tablet 0  . nicotine (NICODERM CQ - DOSED IN MG/24 HOURS) 21 mg/24hr patch 21 mg patch daily 1 week and 14 mg patch daily 3 weeks then 7 mg patch daily 3 weeks and stop 28 patch 0  . traZODone (DESYREL) 50 MG tablet Take 1 tablet (50 mg total) by mouth at bedtime. 30 tablet 0   No current facility-administered medications on file prior to  visit.     Allergies:  No Known Allergies   Physical Exam  There were no vitals filed for this visit. There is no height or weight on file to calculate BMI. No exam data present  General: well developed, well nourished, seated, in no evident distress Head: head normocephalic and atraumatic.   Neck: supple with no carotid or supraclavicular bruits Cardiovascular: regular rate and rhythm, no murmurs Musculoskeletal: no deformity Skin:  no rash/petichiae Vascular:  Normal pulses all extremities  Neurologic Exam Mental Status: Awake and fully alert. Oriented to place and time. Recent and remote memory intact. Attention span, concentration and fund of knowledge appropriate. Mood and affect appropriate.  No flowsheet data found. Cranial Nerves: Fundoscopic exam reveals sharp disc margins. Pupils equal, briskly reactive to light. Extraocular movements full without nystagmus. Visual fields full to confrontation. Hearing intact. Facial sensation intact. Face, tongue, palate moves normally and symmetrically.  Motor: Normal bulk and tone. Normal strength in all tested extremity muscles. Sensory.: intact to touch , pinprick , position and vibratory sensation.  Coordination: Rapid alternating movements normal in all extremities. Finger-to-nose and heel-to-shin performed accurately bilaterally. Gait and Station: Arises from chair without difficulty. Stance is normal. Gait demonstrates normal stride length and balance . Able to heel, toe and tandem walk without difficulty.  Reflexes: 1+ and symmetric. Toes downgoing.    NIHSS  *** Modified Rankin  ***    Diagnostic Data (Labs, Imaging, Testing)  CT head WO contrast Study date: 10/01/2017 IMPRESSION: 1. Subtle hypoattenuation involving the right caudate head and anterior right insular cortex is concerning for acute/subacute ischemia. 2. Mild white matter disease otherwise. This likely reflects the sequela of chronic microvascular  ischemia.  CTA head/neck Study date: 10/01/2017 IMPRESSION: 1. Negative for emergent large vessel occlusion. No hemodynamically significant arterial stenosis in the head or neck. 2. No significant extracranial atherosclerosis. Mild to moderate intracranial atherosclerosis most apparent at the ICA siphons and left MCA bifurcation. 3. Stable CT appearance of the brain from 1437 hours today. Age indeterminate small vessel ischemia in the right basal ganglia and left deep white matter capsules.  MRI brain WO contrast MRA head WO contrast Study date: 10/02/2017 IMPRESSION: 1. Small acute/early subacute infarction within right posterior limb of internal capsule. No hemorrhage or mass effect. 2. Mild chronic microvascular ischemic changes and parenchymal volume loss of the brain. Small chronic lacunar infarctions within the basal ganglia. 3. Patent anterior and posterior intracranial circulation. No large vessel occlusion, aneurysm, or significant stenosis.  Echocardiogram Study date: 10/02/2017 Study Conclusions - Left ventricle:  The cavity size was normal. There was mild   concentric hypertrophy. Systolic function was normal. Wall motion   was normal; there were no regional wall motion abnormalities.   There was a reduced contribution of atrial contraction to   ventricular filling, due to increased ventricular diastolic   pressure or atrial contractile dysfunction. Doppler parameters   are consistent with restrictive physiology, indicative of   decreased left ventricular diastolic compliance and/or increased   left atrial pressure.     ASSESSMENT: Bairon Klemann is a 57 y.o. 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 Vascular risk factors include HTN, DM, HLD, tobacco and EtOH use.     PLAN: -Continue {anticoagulants:31417}  and ***  for secondary stroke prevention -F/u with PCP regarding your ***  management -continue to monitor BP at home  -Maintain strict control of hypertension with blood pressure goal below 130/90, diabetes with hemoglobin A1c goal below 6.5% and cholesterol with LDL cholesterol (bad cholesterol) goal below 70 mg/dL. I also advised the patient to eat a healthy diet with plenty of whole grains, cereals, fruits and vegetables, exercise regularly and maintain ideal body weight.  Follow up in *** or call earlier if needed   Greater than 50% time during this *** minute consultation visit was spent on counseling and coordination of care about ***, and ***, discussion about risk benefit of anticoagulation and answering questions.     George Hugh, AGNP-BC  [facility] [address] [place], [place] [address]  Phone [phone] Fax [phone]

## 2017-11-20 ENCOUNTER — Ambulatory Visit: Payer: Self-pay | Admitting: Adult Health

## 2017-11-26 ENCOUNTER — Ambulatory Visit: Payer: Self-pay | Admitting: Adult Health

## 2017-11-26 ENCOUNTER — Encounter: Payer: Self-pay | Admitting: Adult Health

## 2017-11-26 VITALS — BP 115/82 | HR 99 | Ht 64.0 in | Wt 185.4 lb

## 2017-11-26 DIAGNOSIS — I639 Cerebral infarction, unspecified: Secondary | ICD-10-CM

## 2017-11-26 NOTE — Patient Instructions (Signed)
Continue aspirin 325 mg daily  and lipitor  for secondary stroke prevention  Continue to follow up with primary care doctor regarding cholesterol and blood pressure control  You can try allergy medication for your cough but if it does not resolve, please follow up with primary care doctor as this could be a side effect of lisinopril  Continue to stay active and eat healthy  Continue not smoking or drinking - good job with this!!   Maintain strict control of hypertension with blood pressure goal below 130/90, diabetes with hemoglobin A1c goal below 6.5% and cholesterol with LDL cholesterol (bad cholesterol) goal below 70 mg/dL. I also advised the patient to eat a healthy diet with plenty of whole grains, cereals, fruits and vegetables, exercise regularly and maintain ideal body weight.  Followup in the future with me in 3 months or call earlier if needed

## 2017-11-26 NOTE — Progress Notes (Signed)
[facility] [address] [place]. [place] [address] [phone]       OFFICE FOLLOW UP NOTE  Mr. Willie Herrera Date of Birth:  1960-11-05 Medical Record Number:  [id number]   Reason for Referral:  hospital stroke follow up  CHIEF COMPLAINT:  Chief Complaint  Patient presents with  . Cerebrovascular Accident    Hospital follow up for CVA.    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 History obtained from patient and chart review. Reviewed all radiology images and labs personally.  Mr. Ditter is a 57 year old male with history of hypertension, hyperlipidemia, heavy smoking, heavy alcohol admitted for left-sided weakness, left facial droop and fell.  CT initially concerning for right insular cortex infarct, however MRI showed right PLIC lacunar infarct with old left BG and right CR infarcts.  MRA unremarkable.  CTA showed bilateral siphon atherosclerosis but no significant stenosis.  2D echo showed normal EF.  LDL 142 and A1c 6.2.  UDS negative.  Son confirmed that patient at home heavy smoker and heavy ETOH use, not taking any medication. Patients stroke is consistent with small vessel disease given multiple stroke risk factors.  Discharged on aspirin 325mg  and Lipitor 40mg  for stroke prevention.  Continue stroke risk factor modification.  PT/OT recommend CIR.  Since discharge, patient has been doing well.  Does have residual mild left-sided weakness but states this continues to improve.  Does state that he has continued dysarthria but difficult to tell during conversation due to language barrier.  He continues to take aspirin without side effects of bleeding or bruising.  Continues to take Lipitor without side effects of myalgias.  Blood pressure today satisfactory 115/82.  Patient did receive home PT after hospital discharge.  He is not currently receiving physical therapy at this time but states  that he does continue to do home exercises.  He does have complaints of a frequent dry cough but is unsure of how long he has had this for.  He believes it could be from allergies but at this time has not taken any allergy medication.  Since hospital discharge, patient states he has stopped smoking tobacco and EtOH use.  Denies new or worsening stroke/TIA symptoms.  ROS:   14 system review of systems performed and negative with exception of loss of vision and joint pain  PMH:  Past Medical History:  Diagnosis Date  . AKI (acute kidney injury) (HCC)   . Alcohol abuse   . CVA (cerebral vascular accident) (HCC)   . HTN (hypertension) 08/25/2014  . Hyperlipidemia     PSH: History reviewed. No pertinent surgical history.  Social History:  Social History   Socioeconomic History  . Marital status: Married    Spouse name: Not on file  . Number of children: 2  . Years of education: 65  . Highest education level: High school graduate  Occupational History  . Occupation: makes sushi  Social Needs  . Financial resource strain: Not on file  . Food insecurity:    Worry: Not on file    Inability: Not on file  . Transportation needs:    Medical: Not on file    Non-medical: Not on file  Tobacco Use  . Smoking status: Former Games developer  . Smokeless tobacco: Never Used  Substance and Sexual Activity  . Alcohol use: Not Currently  . Drug use: No  . Sexual activity: Not on file  Lifestyle  . Physical activity:    Days per week: Not on file    Minutes per session: Not on file  . Stress: Not on file  Relationships  . Social connections:    Talks on phone: Not on file    Gets together: Not on file    Attends religious service: Not on file    Active member of club or organization: Not on file    Attends meetings of clubs or organizations: Not on file    Relationship status: Not on file  . Intimate partner violence:    Fear of current or ex partner: Not on file    Emotionally abused: Not  on file    Physically abused: Not on file    Forced sexual activity: Not on file  Other Topics Concern  . Not on file  Social History Narrative   Lives at home with his wife.   Right-handed.   Caffeine: occasional use.    Family History:  Family History  Problem Relation Age of Onset  . Hypertension Mother   . Stroke Maternal Grandfather     Medications:   Current Outpatient Medications on File Prior to Visit  Medication Sig Dispense Refill  . aspirin 325 MG tablet Take 1 tablet (325 mg total) by mouth daily.    Marland Kitchen atorvastatin (LIPITOR) 40 MG tablet Take 1 tablet (40 mg total) by mouth daily at 6 PM. 30 tablet 0  . guaiFENesin (MUCINEX) 600 MG 12 hr tablet Take 1 tablet (600 mg total) by mouth 2 (two) times daily. 60 tablet 0  . lisinopril (PRINIVIL,ZESTRIL) 10 MG tablet Take 1 tablet (10 mg total) by mouth daily. 30 tablet 0  . nicotine (NICODERM CQ - DOSED IN MG/24 HOURS) 21 mg/24hr patch 21 mg patch daily 1 week and 14 mg patch daily 3 weeks then 7 mg patch daily 3 weeks and stop 28 patch 0  . traZODone (DESYREL) 50 MG tablet Take 1 tablet (50 mg total) by mouth at bedtime. 30 tablet 0   No current facility-administered medications on file prior to visit.     Allergies:  No Known Allergies   Physical Exam  Vitals:   11/26/17 1015  BP: 115/82  Pulse: 99  Weight: 185 lb 6.4 oz (84.1 kg)  Height: 5\' 4"  (1.626 m)   Body mass index is 31.82 kg/m. No exam data present  General: well developed, pleasant middle-aged male, well nourished, seated, in no evident distress Head: head normocephalic and atraumatic.   Neck: supple with no carotid or supraclavicular bruits Cardiovascular: regular rate and rhythm, no murmurs Musculoskeletal: no deformity Skin:  no rash/petichiae Vascular:  Normal pulses all extremities  Neurologic Exam Mental Status: Awake and fully alert. Oriented to place and time. Recent and remote memory intact. Attention span, concentration and fund  of knowledge appropriate. Mood and affect appropriate.  Cranial Nerves: Fundoscopic exam reveals sharp disc margins. Pupils equal, briskly reactive to light. Extraocular movements full without nystagmus. Visual fields full to confrontation. Hearing intact. Facial sensation intact. Face, tongue, palate moves normally and symmetrically.  Motor: Normal bulk and tone. Normal strength in right upper and lower extremity.  Mild weakness in left upper extremity and 4/5 weakness in left hip flexor. Sensory.: intact to touch , pinprick , position and vibratory sensation.  Coordination: Rapid alternating movements normal in all extremities. Finger-to-nose and heel-to-shin performed accurately bilaterally. Gait and Station: Arises from chair without difficulty. Stance is normal. Gait demonstrates wobbling steps due to  continued weakness on left side.  Tandem gait not tested. Reflexes: 1+ and symmetric. Toes downgoing.    NIHSS  0 Modified Rankin  1    Diagnostic Data (Labs, Imaging, Testing)  CT HEAD WO CONTRAST 10/01/17 IMPRESSION: 1. Subtle hypoattenuation involving the right caudate head and anterior right insular cortex is concerning for acute/subacute ischemia. 2. Mild white matter disease otherwise. This likely reflects the sequela of chronic microvascular ischemia.  CTA HEAD W OR WO CONTRAST CTA NECK W OR WO CONTRAST 10/01/17 IMPRESSION: 1. Negative for emergent large vessel occlusion. No hemodynamically significant arterial stenosis in the head or neck. 2. No significant extracranial atherosclerosis. Mild to moderate intracranial atherosclerosis most apparent at the ICA siphons and left MCA bifurcation. 3. Stable CT appearance of the brain from 1437 hours today. Age indeterminate small vessel ischemia in the right basal ganglia and left deep white matter capsules.  MR BRAIN WO CONTRAST MRA HEAD WO CONTRAST 10/02/17 IMPRESSION: 1. Small acute/early subacute infarction within right  posterior limb of internal capsule. No hemorrhage or mass effect. 2. Mild chronic microvascular ischemic changes and parenchymal volume loss of the brain. Small chronic lacunar infarctions within the basal ganglia. 3. Patent anterior and posterior intracranial circulation. No large vessel occlusion, aneurysm, or significant stenosis.  2D ECHOCARDIOGRAM 10/02/17 Study Conclusions - Left ventricle: The cavity size was normal. There was mild   concentric hypertrophy. Systolic function was normal. Wall motion   was normal; there were no regional wall motion abnormalities.   There was a reduced contribution of atrial contraction to   ventricular filling, due to increased ventricular diastolic   pressure or atrial contractile dysfunction. Doppler parameters   are consistent with restrictive physiology, indicative of   decreased left ventricular diastolic compliance and/or increased   left atrial pressure.    ASSESSMENT: Rolene CourseLwin Vonstein is a 57 y.o. year old male here with right PLIC on 10/01/17 secondary to SVD. Vascular risk factors include HTN, HLD, tobacco and ETOH use.   PLAN: -Continue aspirin 325 mg daily  and lipitor  for secondary stroke prevention -F/u with PCP regarding your HLD, and HTN management  -referral outpatient PT for residual left-sided hemiparesis -continue to monitor BP at home -Smoking and EtOH cessation -okay to use tylenol for joint pain -OTC allergy medication to help with cough - advised patient if this does not subside to contact PCP as this possibly could be ACE inhibitor side effect -Patient expressed interest in PREMIERS trial -information was provided by [facility] research team -Maintain strict control of hypertension with blood pressure goal below 130/90, diabetes with hemoglobin A1c goal below 6.5% and cholesterol with LDL cholesterol (bad cholesterol) goal below 70 mg/dL. I also advised the patient to eat a healthy diet with plenty of whole grains, cereals, fruits and  vegetables, exercise regularly and maintain ideal body weight.  Follow up in 3 months or call earlier if needed  Greater than 50% time during this 25 minute consultation visit was spent on counseling and coordination of care about HTN, and HLD, discussion about risk benefit of anticoagulation and answering questions.   George HughJessica VanSchaick, AGNP-BC  [facility] [address] [place], [place] [id number]  Phone [phone] Fax [phone]

## 2017-11-28 ENCOUNTER — Telehealth: Payer: Self-pay | Admitting: Medical

## 2017-11-28 NOTE — Telephone Encounter (Signed)
Will you call patient and let me know if they are willing to come in. Hx of stroke. I have not seen him in almost 3 years. Please let me know what they say. Need 30 minute appointment.

## 2017-11-28 NOTE — Progress Notes (Signed)
I agree with the above plan 

## 2017-12-08 ENCOUNTER — Encounter: Payer: Self-pay | Attending: Physical Medicine & Rehabilitation

## 2017-12-08 ENCOUNTER — Encounter: Payer: Self-pay | Admitting: Physical Medicine & Rehabilitation

## 2017-12-08 ENCOUNTER — Other Ambulatory Visit: Payer: Self-pay

## 2017-12-08 ENCOUNTER — Ambulatory Visit (HOSPITAL_BASED_OUTPATIENT_CLINIC_OR_DEPARTMENT_OTHER): Payer: Self-pay | Admitting: Physical Medicine & Rehabilitation

## 2017-12-08 VITALS — BP 130/90 | HR 77 | Ht 63.0 in | Wt 188.0 lb

## 2017-12-08 DIAGNOSIS — Z87891 Personal history of nicotine dependence: Secondary | ICD-10-CM | POA: Insufficient documentation

## 2017-12-08 DIAGNOSIS — G8194 Hemiplegia, unspecified affecting left nondominant side: Secondary | ICD-10-CM

## 2017-12-08 DIAGNOSIS — I69398 Other sequelae of cerebral infarction: Secondary | ICD-10-CM

## 2017-12-08 DIAGNOSIS — I63311 Cerebral infarction due to thrombosis of right middle cerebral artery: Secondary | ICD-10-CM | POA: Insufficient documentation

## 2017-12-08 DIAGNOSIS — I10 Essential (primary) hypertension: Secondary | ICD-10-CM | POA: Insufficient documentation

## 2017-12-08 DIAGNOSIS — Z79899 Other long term (current) drug therapy: Secondary | ICD-10-CM | POA: Insufficient documentation

## 2017-12-08 DIAGNOSIS — E785 Hyperlipidemia, unspecified: Secondary | ICD-10-CM | POA: Insufficient documentation

## 2017-12-08 DIAGNOSIS — R269 Unspecified abnormalities of gait and mobility: Secondary | ICD-10-CM

## 2017-12-08 DIAGNOSIS — Z7982 Long term (current) use of aspirin: Secondary | ICD-10-CM | POA: Insufficient documentation

## 2017-12-08 MED ORDER — LISINOPRIL 10 MG PO TABS: 10.0000 mg | ORAL_TABLET | Freq: Every day | ORAL | 0 refills | Status: AC

## 2017-12-08 MED ORDER — ATORVASTATIN CALCIUM 40 MG PO TABS: 40.0000 mg | ORAL_TABLET | Freq: Every day | ORAL | 0 refills | Status: AC

## 2017-12-08 NOTE — Patient Instructions (Signed)
No need to see me again   See [facility]

## 2017-12-08 NOTE — Progress Notes (Signed)
Subjective:    Patient ID: Willie Herrera, male    DOB: 1961/04/04, 57 y.o.   MRN: [id number] DATE OF ADMISSION:  10/03/2017 DATE OF DISCHARGE:  03/13/201 57 year old right-handed, limited English-speaking, male from [place], with history of hypertension, tobacco abuse, on no prescription medications and limited medical followup, who lives with spouse, independent prior to admission.  Presented on 10/01/2017 with left-sided weakness, fall, slurred speech, hypertensive 200/120.  EKG; sinus rhythm, diffuse ST abnormalities, no prior comparison, troponin negative.  Cranial CT scan showed subtle hypoattenuation involving the right caudate head and anterior right insular cortex concerning for acute subacute ischemia.  CT of head and neck negative for large vessel occlusion.  Urine drug screen negative.  The patient did not receive tPA.  MRI, MRA showed small early subacute infarction, right posterior limb of internal capsule.  No hemorrhage or mass effect.  Maintained on aspirin for CVA prophylaxis.  Subcutaneous Lovenox for DVT prophylaxis. Echocardiogram with left ventricular hypertrophy.  No valvular disease. HPI   Here with interpreter  Does not see [facility] until 5/8 (Wed) for first time.  He is out of his meds and needs a refill until he can be seen Has been out of meds for 1 mo. , still taking ASA   Pt states he has had cough since hospital, also with rhinitis and tearing of eyes, no fever or chills  Mod I with ADLs and ambulates without AD Has not resumed cigarette use! Pain Inventory Average Pain 6 Pain Right Now 5 My pain is sharp and aching  In the last 24 hours, has pain interfered with the following? General activity 6 Relation with others 0 Enjoyment of life 4 What TIME of day is your pain at its worst? varies Sleep (in general) NA  Pain is worse with: some activites Pain improves with: na Relief from Meds: na  Mobility walk without  assistance how many minutes can you walk? 15 ability to climb steps?  yes do you drive?  no  Function I need assistance with the following:  meal prep  Neuro/Psych weakness  Prior Studies Any changes since last visit?  no  Physicians involved in your care Any changes since last visit?  no Primary care will beging with [facility] 12/10/17   Family History  Problem Relation Age of Onset  . Hypertension Mother   . Stroke Maternal Grandfather    Social History   Socioeconomic History  . Marital status: Married    Spouse name: Not on file  . Number of children: 2  . Years of education: 30  . Highest education level: High school graduate  Occupational History  . Occupation: makes sushi  Social Needs  . Financial resource strain: Not on file  . Food insecurity:    Worry: Not on file    Inability: Not on file  . Transportation needs:    Medical: Not on file    Non-medical: Not on file  Tobacco Use  . Smoking status: Former Games developer  . Smokeless tobacco: Never Used  Substance and Sexual Activity  . Alcohol use: Not Currently  . Drug use: No  . Sexual activity: Not on file  Lifestyle  . Physical activity:    Days per week: Not on file    Minutes per session: Not on file  . Stress: Not on file  Relationships  . Social connections:    Talks on phone: Not on file    Gets together: Not on file  Attends religious service: Not on file    Active member of club or organization: Not on file    Attends meetings of clubs or organizations: Not on file    Relationship status: Not on file  Other Topics Concern  . Not on file  Social History Narrative   Lives at home with his wife.   Right-handed.   Caffeine: occasional use.   No past surgical history on file. Past Medical History:  Diagnosis Date  . AKI (acute kidney injury) (HCC)   . Alcohol abuse   . CVA (cerebral vascular accident) (HCC)   . HTN (hypertension) 08/25/2014  . Hyperlipidemia    BP 130/90   Pulse 77    Ht  (1.6 m)   Wt 188 lb (85.3 kg)   SpO2 95%   BMI 33.30 kg/m   Opioid Risk Score:   Fall Risk Score:  `1  Depression screen PHQ 2/9  Depression screen [facility] 2/9 12/08/2017 05/03/2014  Decreased Interest 0 0  Down, Depressed, Hopeless 0 0  PHQ - 2 Score 0 0   Review of Systems  Constitutional: Negative.   HENT: Negative.   Eyes: Negative.   Respiratory: Positive for cough.   Cardiovascular: Negative.   Gastrointestinal: Negative.   Endocrine: Negative.   Genitourinary: Negative.   Musculoskeletal: Negative.   Skin: Negative.   Allergic/Immunologic: Negative.   Neurological: Positive for weakness.  Hematological: Negative.   Psychiatric/Behavioral: Negative.   All other systems reviewed and are negative.      Objective:   Physical Exam  Constitutional: He is oriented to person, place, and time. He appears well-developed and well-nourished. No distress.  HENT:  Head: Normocephalic and atraumatic.  Eyes: Pupils are equal, round, and reactive to light. EOM are normal.  Cardiovascular: Normal rate, regular rhythm and normal heart sounds.  No murmur heard. Pulmonary/Chest: Effort normal and breath sounds normal. No respiratory distress.  Neurological: He is alert and oriented to person, place, and time. No cranial nerve deficit. Coordination abnormal.  Skin: He is not diaphoretic.  Nursing note and vitals reviewed. motor is 4/5 in the left delt, bi, tri, grip 5/5 in R delt, bi, tri, grip, 5/5 in Bilateral HF,KE, ADF/PF Sensation difficult to assess language Gait no toe drag or knee instability but does have a stiff legged gait on Left side        Assessment & Plan:  1.  Right basal ganglia infarct with residual LUE mild weakness and incoordination and gait disorder.    Cont ASA /day Rec f/u Neuro in 59mo, he can get referral from new PCP  2.  Hypertension- was controlled on Zestril  per day.  Has been off with mildly elevated BP, will resume. Doubt if  cough is related to ACE I given that he ran out 1 month ago and cough has persisted  Have written Rx for pt to fill  Last Rx from this office given that pt will establish with a PCP this week  3.  HLD cont atorvastatin  per day f/u lipid panel per PCP, additional Rx from PCP  Also rec 20-30Lb weight loss, walk for exercise  Discussed with pt and interpreter  Over half of the 25 min visit was spent counseling and coordinating care.

## 2017-12-10 NOTE — Telephone Encounter (Signed)
Patient did not remember PCP by this name.

## 2019-10-06 IMAGING — MR MR MRA HEAD W/O CM
9 of 11 series · 29 of 48 positions shown · non-contrast
Comparison: 10/01/2017 CT head and CT angiogram head.

CLINICAL DATA: 56 y/o  M; weakness involving left leg and left arm.

EXAM:
MRI HEAD WITHOUT CONTRAST
MRA HEAD WITHOUT CONTRAST
TECHNIQUE: Multiplanar, multiecho pulse sequences of the brain and surrounding
structures were obtained without intravenous contrast. Angiographic
images of the head were obtained using MRA technique without
contrast.

[Series 4: DWI · axial · 3.0mm · 0.94mm/px · z∈[-91,+55]mm · 6 of 100 slices shown (1 of 2)]
[im 1/100]
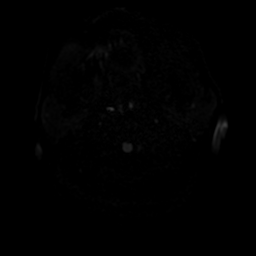
[im 20/100]
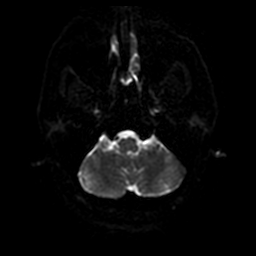
[im 40/100]
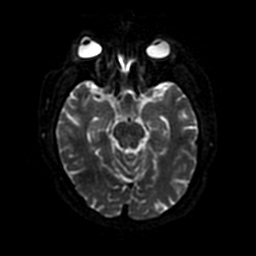
[im 60/100]
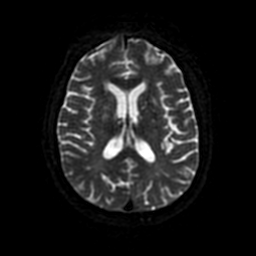
[im 80/100]
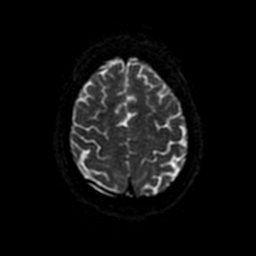
[im 100/100]
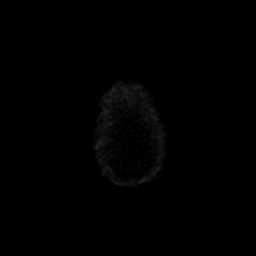

[Series 5: ax (id) 2 · axial · 1.0mm · 0.43mm/px · z∈[-74,-20]mm · 5 of 200 slices shown]
[im 1/200]
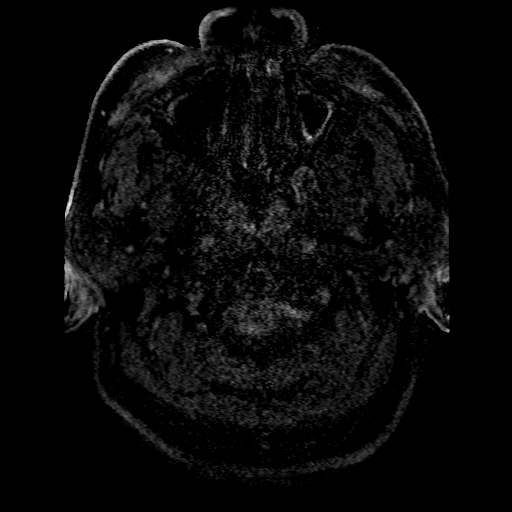
[im 31/200]
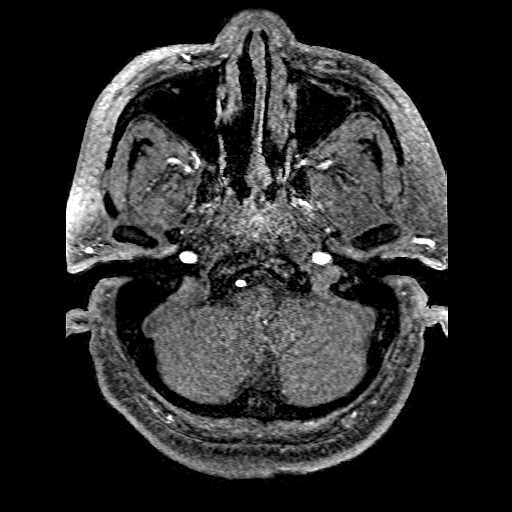
[im 62/200]
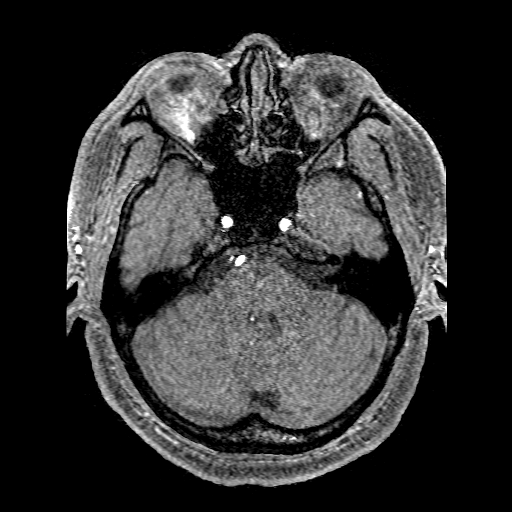
[im 92/200]
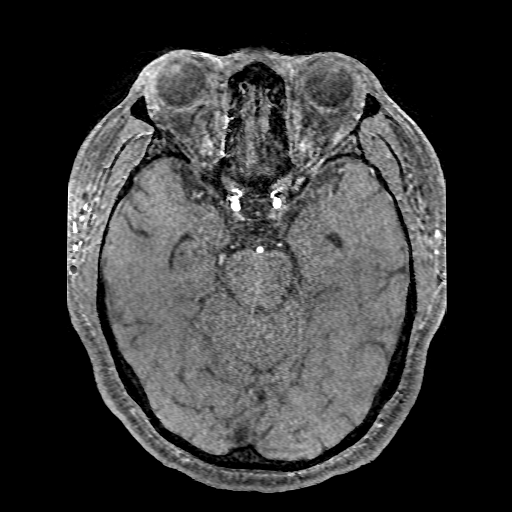
[im 108/200]
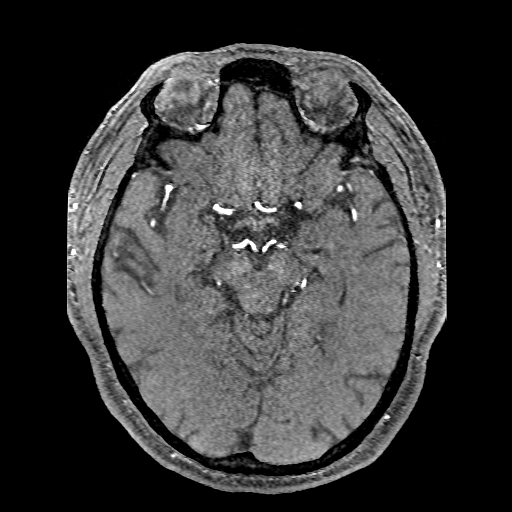

[Series 6: DWI · coronal · 4.0mm · 0.94mm/px · 5 of 70 slices shown (2 of 2)]
[im 1/70]
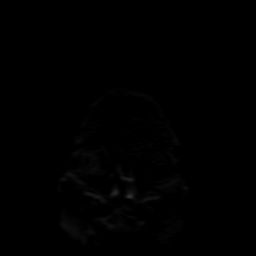
[im 18/70]
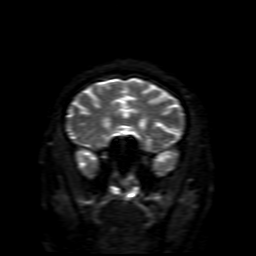
[im 35/70]
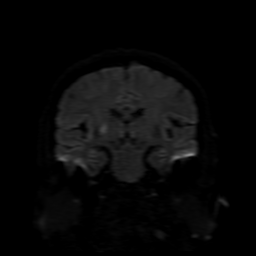
[im 52/70]
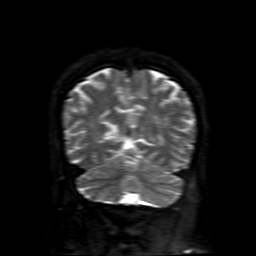
[im 70/70]
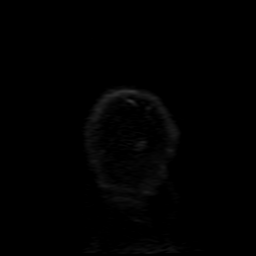

[Series 7: FLAIR · sagittal · 5.0mm · 0.47mm/px · 2 of 23 slices shown (1 of 2)]
[im 1/23]
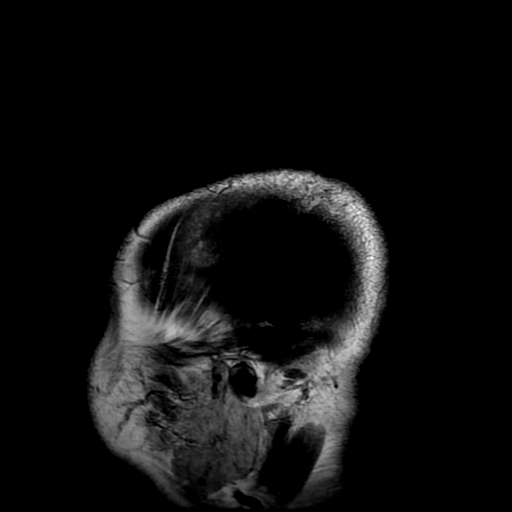
[im 23/23]
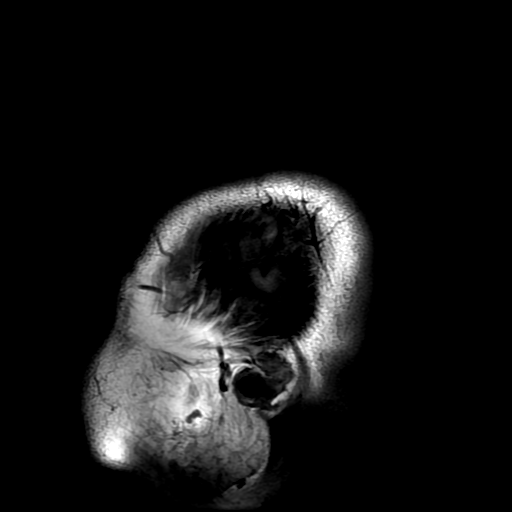

[Series 8: T2 · axial · 5.0mm · 0.43mm/px · z∈[-89,+54]mm · 2 of 25 slices shown (1 of 2)]
[im 1/25]
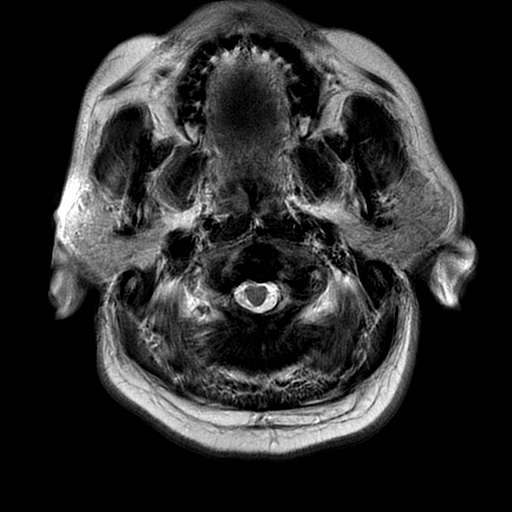
[im 25/25]
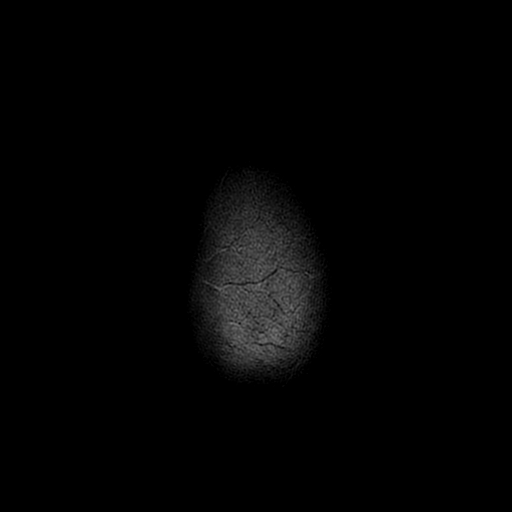

[Series 9: FLAIR · axial · 3.0mm · 0.43mm/px · z∈[-89,+54]mm · 2 of 25 slices shown (2 of 2)]
[im 1/25]
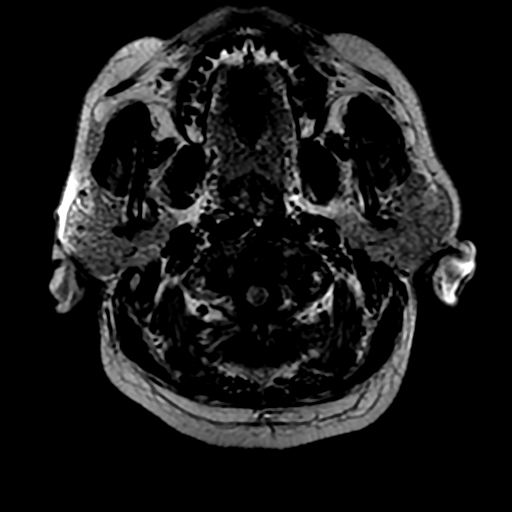
[im 25/25]
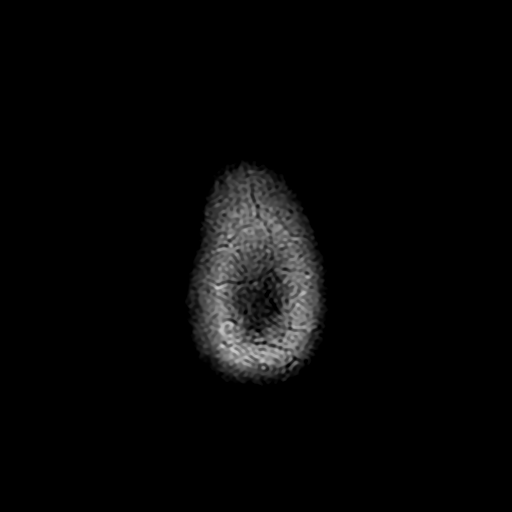

[Series 12: T2 · coronal · 5.0mm · 0.39mm/px · 2 of 29 slices shown (2 of 2)]
[im 1/29]
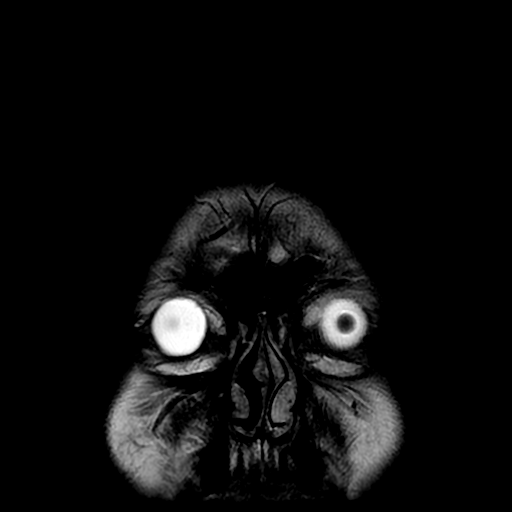
[im 29/29]
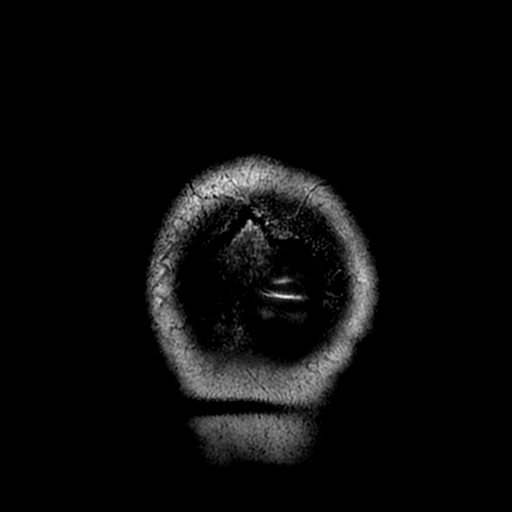

[Series 450: ADC · axial · 3.0mm · 0.94mm/px · z∈[-91,+55]mm · 3 of 50 slices shown (1 of 2)]
[im 1/50]
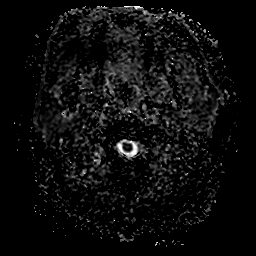
[im 25/50]
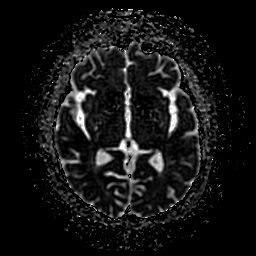
[im 50/50]
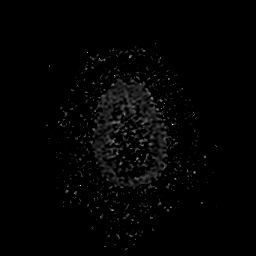

[Series 650: ADC · coronal · 4.0mm · 0.94mm/px · 2 of 35 slices shown (2 of 2)]
[im 1/35]
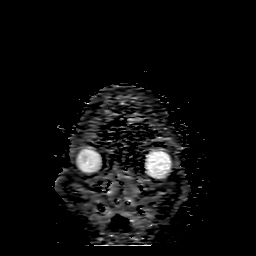
[im 35/35]
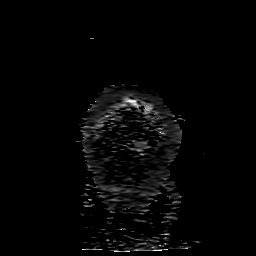

[29 of 48 positions shown; findings below may reference images not displayed]

FINDINGS: MRI HEAD FINDINGS

Brain: 1.7 x 0.7 x 0.6 cm (volume = 0.4 cm^3) focus of reduced
diffusion in the right posterior limb of internal capsule compatible
with acute/early subacute infarction. No hemorrhage or mass effect.

Small chronic lacunar infarcts are present within bilateral putamen
and right caudate head. Fewnonspecific foci of T2 FLAIR hyperintense
signal abnormality in subcortical and periventricular white matter
are compatible withmildchronic microvascular ischemic changes for
age. Mildbrain parenchymal volume loss. No hydrocephalus,
extra-axial collection, or effacement of basilar cisterns.

Vascular: As below.

Skull and upper cervical spine: Normal marrow signal.

Sinuses/Orbits: Negative.

Other: None.

MRA HEAD FINDINGS

Internal carotid arteries: Patent. Irregularity of bilateral carotid
siphons compatible with atherosclerosis. Mild proximal right
cavernous stenosis.

Anterior cerebral arteries:  Patent.

Middle cerebral arteries: Patent. Left M2 inferior division origin
mild stenosis.

Anterior communicating artery: Not identified, likely hypoplastic or
absent.

Posterior communicating arteries:  Patent.  Bilateral fetal PCA.

Posterior cerebral arteries:  Patent.

Basilar artery:  Patent.

Vertebral arteries: Patent. Diminutive right vertebral artery
terminates in right PICA.

No evidence of high-grade stenosis, large vessel occlusion, or
aneurysm identified.
IMPRESSION: 1. Small acute/early subacute infarction within right posterior limb
of internal capsule. No hemorrhage or mass effect.
2. Mild chronic microvascular ischemic changes and parenchymal
volume loss of the brain. Small chronic lacunar infarctions within
the basal ganglia.
3. Patent anterior and posterior intracranial circulation. No large
vessel occlusion, aneurysm, or significant stenosis.

These results will be called to the ordering clinician or
representative by the Radiologist Assistant, and communication
documented in the PACS or zVision Dashboard.

By: Blain Jumper M.D.
